# Patient Record
Sex: Female | Born: 1935 | Race: White | Hispanic: No | Smoking: Former smoker
Health system: Southern US, Community
[De-identification: ages and names within clinical notes are randomized; demographics above are authoritative.]

## PROBLEM LIST (undated history)

## (undated) DIAGNOSIS — I10 Essential (primary) hypertension: Secondary | ICD-10-CM

## (undated) DIAGNOSIS — M1990 Unspecified osteoarthritis, unspecified site: Secondary | ICD-10-CM

## (undated) DIAGNOSIS — I519 Heart disease, unspecified: Secondary | ICD-10-CM

## (undated) DIAGNOSIS — E7800 Pure hypercholesterolemia, unspecified: Secondary | ICD-10-CM

## (undated) DIAGNOSIS — K449 Diaphragmatic hernia without obstruction or gangrene: Secondary | ICD-10-CM

## (undated) DIAGNOSIS — K219 Gastro-esophageal reflux disease without esophagitis: Secondary | ICD-10-CM

## (undated) DIAGNOSIS — K589 Irritable bowel syndrome without diarrhea: Secondary | ICD-10-CM

## (undated) HISTORY — PX: CARDIAC CATHETERIZATION: SHX172

## (undated) HISTORY — PX: EYE SURGERY: SHX253

---

## 1971-02-27 HISTORY — PX: CHOLECYSTECTOMY: SHX55

## 1971-02-27 HISTORY — PX: APPENDECTOMY: SHX54

## 1984-02-27 HISTORY — PX: ABDOMINAL HYSTERECTOMY: SHX81

## 2004-02-27 HISTORY — PX: CATARACT EXTRACTION: SUR2

## 2007-02-27 HISTORY — PX: OTHER SURGICAL HISTORY: SHX169

## 2011-02-27 HISTORY — PX: ULNAR NERVE REPAIR: SHX2594

## 2013-03-07 DIAGNOSIS — R059 Cough, unspecified: Secondary | ICD-10-CM | POA: Diagnosis not present

## 2013-03-07 DIAGNOSIS — R05 Cough: Secondary | ICD-10-CM | POA: Diagnosis not present

## 2013-03-07 DIAGNOSIS — R069 Unspecified abnormalities of breathing: Secondary | ICD-10-CM | POA: Diagnosis not present

## 2013-03-07 DIAGNOSIS — R0609 Other forms of dyspnea: Secondary | ICD-10-CM | POA: Diagnosis not present

## 2013-03-07 DIAGNOSIS — I10 Essential (primary) hypertension: Secondary | ICD-10-CM | POA: Diagnosis not present

## 2013-03-07 DIAGNOSIS — R0989 Other specified symptoms and signs involving the circulatory and respiratory systems: Secondary | ICD-10-CM | POA: Diagnosis not present

## 2013-03-07 DIAGNOSIS — E876 Hypokalemia: Secondary | ICD-10-CM | POA: Diagnosis not present

## 2013-03-07 DIAGNOSIS — J189 Pneumonia, unspecified organism: Secondary | ICD-10-CM | POA: Diagnosis not present

## 2013-03-07 DIAGNOSIS — R071 Chest pain on breathing: Secondary | ICD-10-CM | POA: Diagnosis not present

## 2013-03-08 DIAGNOSIS — I2510 Atherosclerotic heart disease of native coronary artery without angina pectoris: Secondary | ICD-10-CM | POA: Diagnosis present

## 2013-03-08 DIAGNOSIS — R0609 Other forms of dyspnea: Secondary | ICD-10-CM | POA: Diagnosis not present

## 2013-03-08 DIAGNOSIS — R059 Cough, unspecified: Secondary | ICD-10-CM | POA: Diagnosis not present

## 2013-03-08 DIAGNOSIS — G4700 Insomnia, unspecified: Secondary | ICD-10-CM | POA: Diagnosis present

## 2013-03-08 DIAGNOSIS — R079 Chest pain, unspecified: Secondary | ICD-10-CM | POA: Diagnosis not present

## 2013-03-08 DIAGNOSIS — I10 Essential (primary) hypertension: Secondary | ICD-10-CM | POA: Diagnosis not present

## 2013-03-08 DIAGNOSIS — R51 Headache: Secondary | ICD-10-CM | POA: Diagnosis not present

## 2013-03-08 DIAGNOSIS — R05 Cough: Secondary | ICD-10-CM | POA: Diagnosis not present

## 2013-03-08 DIAGNOSIS — K219 Gastro-esophageal reflux disease without esophagitis: Secondary | ICD-10-CM | POA: Diagnosis not present

## 2013-03-08 DIAGNOSIS — E876 Hypokalemia: Secondary | ICD-10-CM | POA: Diagnosis not present

## 2013-03-08 DIAGNOSIS — R071 Chest pain on breathing: Secondary | ICD-10-CM | POA: Diagnosis not present

## 2013-03-08 DIAGNOSIS — I252 Old myocardial infarction: Secondary | ICD-10-CM | POA: Diagnosis not present

## 2013-03-08 DIAGNOSIS — J189 Pneumonia, unspecified organism: Secondary | ICD-10-CM | POA: Diagnosis not present

## 2013-03-16 DIAGNOSIS — M7989 Other specified soft tissue disorders: Secondary | ICD-10-CM | POA: Diagnosis not present

## 2013-03-16 DIAGNOSIS — G4700 Insomnia, unspecified: Secondary | ICD-10-CM | POA: Diagnosis not present

## 2013-03-16 DIAGNOSIS — J189 Pneumonia, unspecified organism: Secondary | ICD-10-CM | POA: Diagnosis not present

## 2013-03-16 DIAGNOSIS — M25569 Pain in unspecified knee: Secondary | ICD-10-CM | POA: Diagnosis not present

## 2013-03-17 DIAGNOSIS — R9431 Abnormal electrocardiogram [ECG] [EKG]: Secondary | ICD-10-CM | POA: Diagnosis not present

## 2013-03-17 DIAGNOSIS — M7989 Other specified soft tissue disorders: Secondary | ICD-10-CM | POA: Diagnosis not present

## 2013-03-23 DIAGNOSIS — J189 Pneumonia, unspecified organism: Secondary | ICD-10-CM | POA: Diagnosis not present

## 2013-03-24 DIAGNOSIS — M1710 Unilateral primary osteoarthritis, unspecified knee: Secondary | ICD-10-CM | POA: Diagnosis not present

## 2013-03-24 DIAGNOSIS — M79609 Pain in unspecified limb: Secondary | ICD-10-CM | POA: Diagnosis not present

## 2013-04-03 DIAGNOSIS — M7989 Other specified soft tissue disorders: Secondary | ICD-10-CM | POA: Diagnosis not present

## 2013-04-03 DIAGNOSIS — M79609 Pain in unspecified limb: Secondary | ICD-10-CM | POA: Diagnosis not present

## 2013-04-09 DIAGNOSIS — M1710 Unilateral primary osteoarthritis, unspecified knee: Secondary | ICD-10-CM | POA: Diagnosis not present

## 2013-04-09 DIAGNOSIS — M19079 Primary osteoarthritis, unspecified ankle and foot: Secondary | ICD-10-CM | POA: Diagnosis not present

## 2013-04-09 DIAGNOSIS — M7989 Other specified soft tissue disorders: Secondary | ICD-10-CM | POA: Diagnosis not present

## 2013-04-23 DIAGNOSIS — H4011X0 Primary open-angle glaucoma, stage unspecified: Secondary | ICD-10-CM | POA: Diagnosis not present

## 2013-04-23 DIAGNOSIS — H2640 Unspecified secondary cataract: Secondary | ICD-10-CM | POA: Diagnosis not present

## 2013-05-04 DIAGNOSIS — E782 Mixed hyperlipidemia: Secondary | ICD-10-CM | POA: Diagnosis not present

## 2013-05-04 DIAGNOSIS — N189 Chronic kidney disease, unspecified: Secondary | ICD-10-CM | POA: Diagnosis not present

## 2013-05-04 DIAGNOSIS — I10 Essential (primary) hypertension: Secondary | ICD-10-CM | POA: Diagnosis not present

## 2013-05-04 DIAGNOSIS — E785 Hyperlipidemia, unspecified: Secondary | ICD-10-CM | POA: Diagnosis not present

## 2013-05-04 DIAGNOSIS — I129 Hypertensive chronic kidney disease with stage 1 through stage 4 chronic kidney disease, or unspecified chronic kidney disease: Secondary | ICD-10-CM | POA: Diagnosis not present

## 2013-05-04 DIAGNOSIS — R0609 Other forms of dyspnea: Secondary | ICD-10-CM | POA: Diagnosis not present

## 2013-05-04 DIAGNOSIS — D649 Anemia, unspecified: Secondary | ICD-10-CM | POA: Diagnosis not present

## 2013-05-04 DIAGNOSIS — K219 Gastro-esophageal reflux disease without esophagitis: Secondary | ICD-10-CM | POA: Diagnosis not present

## 2013-05-04 DIAGNOSIS — I519 Heart disease, unspecified: Secondary | ICD-10-CM | POA: Diagnosis not present

## 2013-05-08 DIAGNOSIS — R0609 Other forms of dyspnea: Secondary | ICD-10-CM | POA: Diagnosis not present

## 2013-05-08 DIAGNOSIS — R0989 Other specified symptoms and signs involving the circulatory and respiratory systems: Secondary | ICD-10-CM | POA: Diagnosis not present

## 2013-05-15 DIAGNOSIS — R0609 Other forms of dyspnea: Secondary | ICD-10-CM | POA: Diagnosis not present

## 2013-05-15 DIAGNOSIS — I519 Heart disease, unspecified: Secondary | ICD-10-CM | POA: Diagnosis not present

## 2013-05-15 DIAGNOSIS — I059 Rheumatic mitral valve disease, unspecified: Secondary | ICD-10-CM | POA: Diagnosis not present

## 2013-05-15 DIAGNOSIS — R0989 Other specified symptoms and signs involving the circulatory and respiratory systems: Secondary | ICD-10-CM | POA: Diagnosis not present

## 2013-05-22 DIAGNOSIS — R0989 Other specified symptoms and signs involving the circulatory and respiratory systems: Secondary | ICD-10-CM | POA: Diagnosis not present

## 2013-05-22 DIAGNOSIS — I2510 Atherosclerotic heart disease of native coronary artery without angina pectoris: Secondary | ICD-10-CM | POA: Diagnosis not present

## 2013-05-22 DIAGNOSIS — R0609 Other forms of dyspnea: Secondary | ICD-10-CM | POA: Diagnosis not present

## 2013-05-22 DIAGNOSIS — I209 Angina pectoris, unspecified: Secondary | ICD-10-CM | POA: Diagnosis not present

## 2013-06-23 DIAGNOSIS — E785 Hyperlipidemia, unspecified: Secondary | ICD-10-CM | POA: Diagnosis not present

## 2013-06-23 DIAGNOSIS — R011 Cardiac murmur, unspecified: Secondary | ICD-10-CM | POA: Diagnosis not present

## 2013-06-23 DIAGNOSIS — I2510 Atherosclerotic heart disease of native coronary artery without angina pectoris: Secondary | ICD-10-CM | POA: Diagnosis not present

## 2013-06-23 DIAGNOSIS — R012 Other cardiac sounds: Secondary | ICD-10-CM | POA: Diagnosis not present

## 2013-07-07 DIAGNOSIS — J209 Acute bronchitis, unspecified: Secondary | ICD-10-CM | POA: Diagnosis not present

## 2013-07-23 DIAGNOSIS — R609 Edema, unspecified: Secondary | ICD-10-CM | POA: Diagnosis not present

## 2013-07-26 DIAGNOSIS — R059 Cough, unspecified: Secondary | ICD-10-CM | POA: Diagnosis not present

## 2013-07-26 DIAGNOSIS — R0602 Shortness of breath: Secondary | ICD-10-CM | POA: Diagnosis not present

## 2013-07-26 DIAGNOSIS — R05 Cough: Secondary | ICD-10-CM | POA: Diagnosis not present

## 2013-07-26 DIAGNOSIS — R0609 Other forms of dyspnea: Secondary | ICD-10-CM | POA: Diagnosis not present

## 2013-07-26 DIAGNOSIS — I10 Essential (primary) hypertension: Secondary | ICD-10-CM | POA: Diagnosis not present

## 2013-08-04 DIAGNOSIS — I2510 Atherosclerotic heart disease of native coronary artery without angina pectoris: Secondary | ICD-10-CM | POA: Diagnosis not present

## 2013-08-04 DIAGNOSIS — E785 Hyperlipidemia, unspecified: Secondary | ICD-10-CM | POA: Diagnosis not present

## 2013-08-04 DIAGNOSIS — R011 Cardiac murmur, unspecified: Secondary | ICD-10-CM | POA: Diagnosis not present

## 2013-08-06 DIAGNOSIS — H409 Unspecified glaucoma: Secondary | ICD-10-CM | POA: Diagnosis not present

## 2013-08-06 DIAGNOSIS — I10 Essential (primary) hypertension: Secondary | ICD-10-CM | POA: Diagnosis not present

## 2013-08-06 DIAGNOSIS — E785 Hyperlipidemia, unspecified: Secondary | ICD-10-CM | POA: Diagnosis not present

## 2013-08-06 DIAGNOSIS — Z1239 Encounter for other screening for malignant neoplasm of breast: Secondary | ICD-10-CM | POA: Diagnosis not present

## 2013-08-06 DIAGNOSIS — I2510 Atherosclerotic heart disease of native coronary artery without angina pectoris: Secondary | ICD-10-CM | POA: Diagnosis not present

## 2013-09-10 DIAGNOSIS — K5900 Constipation, unspecified: Secondary | ICD-10-CM | POA: Diagnosis not present

## 2013-09-10 DIAGNOSIS — K449 Diaphragmatic hernia without obstruction or gangrene: Secondary | ICD-10-CM | POA: Diagnosis not present

## 2013-09-10 DIAGNOSIS — K219 Gastro-esophageal reflux disease without esophagitis: Secondary | ICD-10-CM | POA: Diagnosis not present

## 2013-09-10 DIAGNOSIS — K2970 Gastritis, unspecified, without bleeding: Secondary | ICD-10-CM | POA: Diagnosis not present

## 2013-09-24 DIAGNOSIS — Z1231 Encounter for screening mammogram for malignant neoplasm of breast: Secondary | ICD-10-CM | POA: Diagnosis not present

## 2013-09-28 DIAGNOSIS — R21 Rash and other nonspecific skin eruption: Secondary | ICD-10-CM | POA: Diagnosis not present

## 2013-09-28 DIAGNOSIS — Z6828 Body mass index (BMI) 28.0-28.9, adult: Secondary | ICD-10-CM | POA: Diagnosis not present

## 2013-10-14 DIAGNOSIS — K449 Diaphragmatic hernia without obstruction or gangrene: Secondary | ICD-10-CM | POA: Diagnosis not present

## 2013-10-16 DIAGNOSIS — Z23 Encounter for immunization: Secondary | ICD-10-CM | POA: Diagnosis not present

## 2013-10-22 DIAGNOSIS — H2640 Unspecified secondary cataract: Secondary | ICD-10-CM | POA: Diagnosis not present

## 2013-10-22 DIAGNOSIS — H4011X0 Primary open-angle glaucoma, stage unspecified: Secondary | ICD-10-CM | POA: Diagnosis not present

## 2013-11-25 DIAGNOSIS — E785 Hyperlipidemia, unspecified: Secondary | ICD-10-CM | POA: Diagnosis not present

## 2013-11-25 DIAGNOSIS — I2510 Atherosclerotic heart disease of native coronary artery without angina pectoris: Secondary | ICD-10-CM | POA: Diagnosis not present

## 2013-11-25 DIAGNOSIS — K5900 Constipation, unspecified: Secondary | ICD-10-CM | POA: Diagnosis not present

## 2013-11-25 DIAGNOSIS — R011 Cardiac murmur, unspecified: Secondary | ICD-10-CM | POA: Diagnosis not present

## 2013-11-25 DIAGNOSIS — K219 Gastro-esophageal reflux disease without esophagitis: Secondary | ICD-10-CM | POA: Diagnosis not present

## 2013-11-25 DIAGNOSIS — R609 Edema, unspecified: Secondary | ICD-10-CM | POA: Diagnosis not present

## 2013-11-25 DIAGNOSIS — R1012 Left upper quadrant pain: Secondary | ICD-10-CM | POA: Diagnosis not present

## 2013-11-25 DIAGNOSIS — K449 Diaphragmatic hernia without obstruction or gangrene: Secondary | ICD-10-CM | POA: Diagnosis not present

## 2013-12-03 DIAGNOSIS — R079 Chest pain, unspecified: Secondary | ICD-10-CM | POA: Diagnosis not present

## 2013-12-04 DIAGNOSIS — R079 Chest pain, unspecified: Secondary | ICD-10-CM | POA: Diagnosis not present

## 2013-12-07 DIAGNOSIS — H409 Unspecified glaucoma: Secondary | ICD-10-CM | POA: Diagnosis not present

## 2013-12-07 DIAGNOSIS — E785 Hyperlipidemia, unspecified: Secondary | ICD-10-CM | POA: Diagnosis not present

## 2013-12-07 DIAGNOSIS — I2510 Atherosclerotic heart disease of native coronary artery without angina pectoris: Secondary | ICD-10-CM | POA: Diagnosis not present

## 2013-12-07 DIAGNOSIS — I10 Essential (primary) hypertension: Secondary | ICD-10-CM | POA: Diagnosis not present

## 2013-12-07 DIAGNOSIS — K219 Gastro-esophageal reflux disease without esophagitis: Secondary | ICD-10-CM | POA: Diagnosis not present

## 2013-12-07 DIAGNOSIS — Z13820 Encounter for screening for osteoporosis: Secondary | ICD-10-CM | POA: Diagnosis not present

## 2013-12-18 DIAGNOSIS — K449 Diaphragmatic hernia without obstruction or gangrene: Secondary | ICD-10-CM | POA: Diagnosis not present

## 2013-12-18 DIAGNOSIS — K219 Gastro-esophageal reflux disease without esophagitis: Secondary | ICD-10-CM | POA: Diagnosis not present

## 2014-02-18 DIAGNOSIS — S2020XA Contusion of thorax, unspecified, initial encounter: Secondary | ICD-10-CM | POA: Diagnosis not present

## 2014-03-09 DIAGNOSIS — L309 Dermatitis, unspecified: Secondary | ICD-10-CM | POA: Diagnosis not present

## 2014-03-09 DIAGNOSIS — I10 Essential (primary) hypertension: Secondary | ICD-10-CM | POA: Diagnosis not present

## 2014-03-09 DIAGNOSIS — Z13820 Encounter for screening for osteoporosis: Secondary | ICD-10-CM | POA: Diagnosis not present

## 2014-03-09 DIAGNOSIS — E785 Hyperlipidemia, unspecified: Secondary | ICD-10-CM | POA: Diagnosis not present

## 2014-03-09 DIAGNOSIS — H409 Unspecified glaucoma: Secondary | ICD-10-CM | POA: Diagnosis not present

## 2014-03-09 DIAGNOSIS — E876 Hypokalemia: Secondary | ICD-10-CM | POA: Diagnosis not present

## 2014-03-09 DIAGNOSIS — I2510 Atherosclerotic heart disease of native coronary artery without angina pectoris: Secondary | ICD-10-CM | POA: Diagnosis not present

## 2014-04-02 DIAGNOSIS — D485 Neoplasm of uncertain behavior of skin: Secondary | ICD-10-CM | POA: Diagnosis not present

## 2014-04-02 DIAGNOSIS — L820 Inflamed seborrheic keratosis: Secondary | ICD-10-CM | POA: Diagnosis not present

## 2014-04-02 DIAGNOSIS — D225 Melanocytic nevi of trunk: Secondary | ICD-10-CM | POA: Diagnosis not present

## 2014-04-02 DIAGNOSIS — L299 Pruritus, unspecified: Secondary | ICD-10-CM | POA: Diagnosis not present

## 2014-04-22 DIAGNOSIS — H524 Presbyopia: Secondary | ICD-10-CM | POA: Diagnosis not present

## 2014-04-22 DIAGNOSIS — H4011X3 Primary open-angle glaucoma, severe stage: Secondary | ICD-10-CM | POA: Diagnosis not present

## 2014-04-22 DIAGNOSIS — Z961 Presence of intraocular lens: Secondary | ICD-10-CM | POA: Diagnosis not present

## 2014-04-23 DIAGNOSIS — L988 Other specified disorders of the skin and subcutaneous tissue: Secondary | ICD-10-CM | POA: Diagnosis not present

## 2014-04-23 DIAGNOSIS — D225 Melanocytic nevi of trunk: Secondary | ICD-10-CM | POA: Diagnosis not present

## 2014-04-23 DIAGNOSIS — D485 Neoplasm of uncertain behavior of skin: Secondary | ICD-10-CM | POA: Diagnosis not present

## 2014-04-23 DIAGNOSIS — L299 Pruritus, unspecified: Secondary | ICD-10-CM | POA: Diagnosis not present

## 2014-04-27 DIAGNOSIS — I6529 Occlusion and stenosis of unspecified carotid artery: Secondary | ICD-10-CM | POA: Diagnosis not present

## 2014-05-07 DIAGNOSIS — H524 Presbyopia: Secondary | ICD-10-CM | POA: Diagnosis not present

## 2014-05-07 DIAGNOSIS — Z961 Presence of intraocular lens: Secondary | ICD-10-CM | POA: Diagnosis not present

## 2014-05-07 DIAGNOSIS — H4011X3 Primary open-angle glaucoma, severe stage: Secondary | ICD-10-CM | POA: Diagnosis not present

## 2014-06-22 DIAGNOSIS — G3184 Mild cognitive impairment, so stated: Secondary | ICD-10-CM | POA: Diagnosis not present

## 2014-06-22 DIAGNOSIS — I10 Essential (primary) hypertension: Secondary | ICD-10-CM | POA: Diagnosis not present

## 2014-06-22 DIAGNOSIS — K210 Gastro-esophageal reflux disease with esophagitis: Secondary | ICD-10-CM | POA: Diagnosis not present

## 2014-06-22 DIAGNOSIS — E785 Hyperlipidemia, unspecified: Secondary | ICD-10-CM | POA: Diagnosis not present

## 2014-06-22 DIAGNOSIS — H409 Unspecified glaucoma: Secondary | ICD-10-CM | POA: Diagnosis not present

## 2014-06-22 DIAGNOSIS — Z79899 Other long term (current) drug therapy: Secondary | ICD-10-CM | POA: Diagnosis not present

## 2014-06-22 DIAGNOSIS — E876 Hypokalemia: Secondary | ICD-10-CM | POA: Diagnosis not present

## 2014-06-22 DIAGNOSIS — L309 Dermatitis, unspecified: Secondary | ICD-10-CM | POA: Diagnosis not present

## 2014-06-22 DIAGNOSIS — I2510 Atherosclerotic heart disease of native coronary artery without angina pectoris: Secondary | ICD-10-CM | POA: Diagnosis not present

## 2014-06-22 DIAGNOSIS — G629 Polyneuropathy, unspecified: Secondary | ICD-10-CM | POA: Diagnosis not present

## 2014-07-27 DIAGNOSIS — E782 Mixed hyperlipidemia: Secondary | ICD-10-CM | POA: Diagnosis not present

## 2014-07-27 DIAGNOSIS — I25110 Atherosclerotic heart disease of native coronary artery with unstable angina pectoris: Secondary | ICD-10-CM | POA: Diagnosis not present

## 2014-07-27 DIAGNOSIS — R011 Cardiac murmur, unspecified: Secondary | ICD-10-CM | POA: Diagnosis not present

## 2014-08-03 DIAGNOSIS — H4011X3 Primary open-angle glaucoma, severe stage: Secondary | ICD-10-CM | POA: Diagnosis not present

## 2014-08-03 DIAGNOSIS — Z961 Presence of intraocular lens: Secondary | ICD-10-CM | POA: Diagnosis not present

## 2014-08-03 DIAGNOSIS — H524 Presbyopia: Secondary | ICD-10-CM | POA: Diagnosis not present

## 2014-09-01 DIAGNOSIS — M779 Enthesopathy, unspecified: Secondary | ICD-10-CM | POA: Diagnosis not present

## 2014-09-28 DIAGNOSIS — I10 Essential (primary) hypertension: Secondary | ICD-10-CM | POA: Diagnosis not present

## 2014-09-28 DIAGNOSIS — Z1239 Encounter for other screening for malignant neoplasm of breast: Secondary | ICD-10-CM | POA: Diagnosis not present

## 2014-09-28 DIAGNOSIS — N183 Chronic kidney disease, stage 3 (moderate): Secondary | ICD-10-CM | POA: Diagnosis not present

## 2014-09-28 DIAGNOSIS — Z79899 Other long term (current) drug therapy: Secondary | ICD-10-CM | POA: Diagnosis not present

## 2014-09-28 DIAGNOSIS — Z1329 Encounter for screening for other suspected endocrine disorder: Secondary | ICD-10-CM | POA: Diagnosis not present

## 2014-09-28 DIAGNOSIS — Z13820 Encounter for screening for osteoporosis: Secondary | ICD-10-CM | POA: Diagnosis not present

## 2014-09-28 DIAGNOSIS — E785 Hyperlipidemia, unspecified: Secondary | ICD-10-CM | POA: Diagnosis not present

## 2014-09-28 DIAGNOSIS — G3184 Mild cognitive impairment, so stated: Secondary | ICD-10-CM | POA: Diagnosis not present

## 2014-09-28 DIAGNOSIS — H409 Unspecified glaucoma: Secondary | ICD-10-CM | POA: Diagnosis not present

## 2014-09-28 DIAGNOSIS — G629 Polyneuropathy, unspecified: Secondary | ICD-10-CM | POA: Diagnosis not present

## 2014-09-28 DIAGNOSIS — I2510 Atherosclerotic heart disease of native coronary artery without angina pectoris: Secondary | ICD-10-CM | POA: Diagnosis not present

## 2014-10-04 DIAGNOSIS — Z780 Asymptomatic menopausal state: Secondary | ICD-10-CM | POA: Diagnosis not present

## 2014-10-04 DIAGNOSIS — Z1231 Encounter for screening mammogram for malignant neoplasm of breast: Secondary | ICD-10-CM | POA: Diagnosis not present

## 2014-10-04 DIAGNOSIS — M810 Age-related osteoporosis without current pathological fracture: Secondary | ICD-10-CM | POA: Diagnosis not present

## 2014-10-04 DIAGNOSIS — E785 Hyperlipidemia, unspecified: Secondary | ICD-10-CM | POA: Diagnosis not present

## 2014-10-04 DIAGNOSIS — M818 Other osteoporosis without current pathological fracture: Secondary | ICD-10-CM | POA: Diagnosis not present

## 2014-11-15 ENCOUNTER — Emergency Department (HOSPITAL_COMMUNITY): Payer: Medicare Other

## 2014-11-15 ENCOUNTER — Encounter (HOSPITAL_COMMUNITY): Payer: Self-pay | Admitting: Emergency Medicine

## 2014-11-15 ENCOUNTER — Inpatient Hospital Stay (HOSPITAL_COMMUNITY)
Admission: EM | Admit: 2014-11-15 | Discharge: 2014-11-30 | DRG: 336 | Disposition: A | Discharge status: Home / Self Care | Payer: Medicare Other | Attending: Internal Medicine | Admitting: Internal Medicine

## 2014-11-15 DIAGNOSIS — D649 Anemia, unspecified: Secondary | ICD-10-CM | POA: Diagnosis present

## 2014-11-15 DIAGNOSIS — Z6826 Body mass index (BMI) 26.0-26.9, adult: Secondary | ICD-10-CM

## 2014-11-15 DIAGNOSIS — Z87891 Personal history of nicotine dependence: Secondary | ICD-10-CM | POA: Diagnosis not present

## 2014-11-15 DIAGNOSIS — K589 Irritable bowel syndrome without diarrhea: Secondary | ICD-10-CM | POA: Diagnosis present

## 2014-11-15 DIAGNOSIS — Z79899 Other long term (current) drug therapy: Secondary | ICD-10-CM

## 2014-11-15 DIAGNOSIS — Z7982 Long term (current) use of aspirin: Secondary | ICD-10-CM

## 2014-11-15 DIAGNOSIS — G8929 Other chronic pain: Secondary | ICD-10-CM | POA: Diagnosis present

## 2014-11-15 DIAGNOSIS — D696 Thrombocytopenia, unspecified: Secondary | ICD-10-CM | POA: Diagnosis not present

## 2014-11-15 DIAGNOSIS — E876 Hypokalemia: Secondary | ICD-10-CM | POA: Diagnosis not present

## 2014-11-15 DIAGNOSIS — I493 Ventricular premature depolarization: Secondary | ICD-10-CM | POA: Diagnosis present

## 2014-11-15 DIAGNOSIS — R112 Nausea with vomiting, unspecified: Secondary | ICD-10-CM | POA: Diagnosis not present

## 2014-11-15 DIAGNOSIS — D72829 Elevated white blood cell count, unspecified: Secondary | ICD-10-CM | POA: Diagnosis present

## 2014-11-15 DIAGNOSIS — K5900 Constipation, unspecified: Secondary | ICD-10-CM | POA: Diagnosis not present

## 2014-11-15 DIAGNOSIS — J9811 Atelectasis: Secondary | ICD-10-CM | POA: Diagnosis not present

## 2014-11-15 DIAGNOSIS — R059 Cough, unspecified: Secondary | ICD-10-CM

## 2014-11-15 DIAGNOSIS — Z8249 Family history of ischemic heart disease and other diseases of the circulatory system: Secondary | ICD-10-CM

## 2014-11-15 DIAGNOSIS — I10 Essential (primary) hypertension: Secondary | ICD-10-CM | POA: Diagnosis not present

## 2014-11-15 DIAGNOSIS — Z90710 Acquired absence of both cervix and uterus: Secondary | ICD-10-CM

## 2014-11-15 DIAGNOSIS — K449 Diaphragmatic hernia without obstruction or gangrene: Secondary | ICD-10-CM | POA: Diagnosis present

## 2014-11-15 DIAGNOSIS — R05 Cough: Secondary | ICD-10-CM | POA: Diagnosis not present

## 2014-11-15 DIAGNOSIS — Z833 Family history of diabetes mellitus: Secondary | ICD-10-CM | POA: Diagnosis not present

## 2014-11-15 DIAGNOSIS — K56609 Unspecified intestinal obstruction, unspecified as to partial versus complete obstruction: Secondary | ICD-10-CM

## 2014-11-15 DIAGNOSIS — K219 Gastro-esophageal reflux disease without esophagitis: Secondary | ICD-10-CM | POA: Diagnosis present

## 2014-11-15 DIAGNOSIS — I959 Hypotension, unspecified: Secondary | ICD-10-CM | POA: Diagnosis not present

## 2014-11-15 DIAGNOSIS — R079 Chest pain, unspecified: Secondary | ICD-10-CM | POA: Diagnosis not present

## 2014-11-15 DIAGNOSIS — I2510 Atherosclerotic heart disease of native coronary artery without angina pectoris: Secondary | ICD-10-CM | POA: Diagnosis present

## 2014-11-15 DIAGNOSIS — R008 Other abnormalities of heart beat: Secondary | ICD-10-CM | POA: Diagnosis not present

## 2014-11-15 DIAGNOSIS — K5660 Unspecified intestinal obstruction: Secondary | ICD-10-CM | POA: Diagnosis not present

## 2014-11-15 DIAGNOSIS — R1013 Epigastric pain: Secondary | ICD-10-CM | POA: Diagnosis not present

## 2014-11-15 DIAGNOSIS — K5669 Other intestinal obstruction: Secondary | ICD-10-CM | POA: Diagnosis not present

## 2014-11-15 DIAGNOSIS — Z9049 Acquired absence of other specified parts of digestive tract: Secondary | ICD-10-CM | POA: Diagnosis not present

## 2014-11-15 DIAGNOSIS — K565 Intestinal adhesions [bands] with obstruction (postprocedural) (postinfection): Principal | ICD-10-CM | POA: Diagnosis present

## 2014-11-15 DIAGNOSIS — I252 Old myocardial infarction: Secondary | ICD-10-CM | POA: Diagnosis not present

## 2014-11-15 DIAGNOSIS — E440 Moderate protein-calorie malnutrition: Secondary | ICD-10-CM | POA: Diagnosis present

## 2014-11-15 DIAGNOSIS — M1990 Unspecified osteoarthritis, unspecified site: Secondary | ICD-10-CM | POA: Diagnosis present

## 2014-11-15 DIAGNOSIS — I498 Other specified cardiac arrhythmias: Secondary | ICD-10-CM | POA: Diagnosis not present

## 2014-11-15 DIAGNOSIS — E785 Hyperlipidemia, unspecified: Secondary | ICD-10-CM | POA: Diagnosis not present

## 2014-11-15 DIAGNOSIS — I499 Cardiac arrhythmia, unspecified: Secondary | ICD-10-CM

## 2014-11-15 HISTORY — DX: Unspecified osteoarthritis, unspecified site: M19.90

## 2014-11-15 HISTORY — DX: Diaphragmatic hernia without obstruction or gangrene: K44.9

## 2014-11-15 HISTORY — DX: Irritable bowel syndrome, unspecified: K58.9

## 2014-11-15 HISTORY — DX: Essential (primary) hypertension: I10

## 2014-11-15 HISTORY — DX: Gastro-esophageal reflux disease without esophagitis: K21.9

## 2014-11-15 HISTORY — DX: Pure hypercholesterolemia, unspecified: E78.00

## 2014-11-15 HISTORY — DX: Heart disease, unspecified: I51.9

## 2014-11-15 LAB — I-STAT CG4 LACTIC ACID, ED: Lactic Acid, Venous: 1.4 mmol/L (ref 0.5–2.0)

## 2014-11-15 LAB — CBC
HEMATOCRIT: 42.5 % (ref 36.0–46.0)
Hemoglobin: 14.3 g/dL (ref 12.0–15.0)
MCH: 31.6 pg (ref 26.0–34.0)
MCHC: 33.6 g/dL (ref 30.0–36.0)
MCV: 94 fL (ref 78.0–100.0)
Platelets: 126 10*3/uL — ABNORMAL LOW (ref 150–400)
RBC: 4.52 MIL/uL (ref 3.87–5.11)
RDW: 15.9 % — AB (ref 11.5–15.5)
WBC: 11.9 10*3/uL — AB (ref 4.0–10.5)

## 2014-11-15 LAB — BASIC METABOLIC PANEL
Anion gap: 11 (ref 5–15)
BUN: 19 mg/dL (ref 6–20)
CHLORIDE: 99 mmol/L — AB (ref 101–111)
CO2: 28 mmol/L (ref 22–32)
CREATININE: 0.96 mg/dL (ref 0.44–1.00)
Calcium: 10 mg/dL (ref 8.9–10.3)
GFR calc Af Amer: >60 mL/min (ref >60)
GFR calc non Af Amer: 54 mL/min — ABNORMAL LOW (ref >60)
GLUCOSE: 159 mg/dL — AB (ref 65–99)
POTASSIUM: 3.6 mmol/L (ref 3.5–5.1)
Sodium: 138 mmol/L (ref 135–145)

## 2014-11-15 LAB — HEPATIC FUNCTION PANEL
ALT: 17 U/L (ref 14–54)
AST: 31 U/L (ref 15–41)
Albumin: 4.8 g/dL (ref 3.5–5.0)
Alkaline Phosphatase: 68 U/L (ref 38–126)
Bilirubin, Direct: 0.1 mg/dL (ref 0.1–0.5)
Indirect Bilirubin: 0.5 mg/dL (ref 0.3–0.9)
Total Bilirubin: 0.6 mg/dL (ref 0.3–1.2)
Total Protein: 7.8 g/dL (ref 6.5–8.1)

## 2014-11-15 LAB — I-STAT TROPONIN, ED: Troponin i, poc: 0.01 ng/mL (ref 0.00–0.08)

## 2014-11-15 LAB — LIPASE, BLOOD: Lipase: 27 U/L (ref 22–51)

## 2014-11-15 MED ORDER — LIDOCAINE HCL 2 % EX GEL
CUTANEOUS | Status: AC
  Filled 2014-11-15: qty 10

## 2014-11-15 MED ORDER — TIMOLOL MALEATE 0.5 % OP SOLN
1.0000 [drp] | Freq: Two times a day (BID) | OPHTHALMIC | Status: DC
  Administered 2014-11-15 – 2014-11-30 (×30): 1 [drp] via OPHTHALMIC
  Filled 2014-11-15: qty 5

## 2014-11-15 MED ORDER — CARVEDILOL 3.125 MG PO TABS
3.1250 mg | ORAL_TABLET | Freq: Two times a day (BID) | ORAL | Status: DC
  Filled 2014-11-15 (×3): qty 1

## 2014-11-15 MED ORDER — SODIUM CHLORIDE 0.9 % IV BOLUS (SEPSIS)
1000.0000 mL | Freq: Once | INTRAVENOUS | Status: AC
  Administered 2014-11-15: 1000 mL via INTRAVENOUS

## 2014-11-15 MED ORDER — CHLORHEXIDINE GLUCONATE 0.12 % MT SOLN
15.0000 mL | Freq: Two times a day (BID) | OROMUCOSAL | Status: DC
  Administered 2014-11-15 – 2014-11-30 (×27): 15 mL via OROMUCOSAL
  Filled 2014-11-15 (×31): qty 15

## 2014-11-15 MED ORDER — ASPIRIN 81 MG PO CHEW: 81.0000 mg | CHEWABLE_TABLET | Freq: Every day | ORAL | Status: DC

## 2014-11-15 MED ORDER — METOCLOPRAMIDE HCL 5 MG/ML IJ SOLN
10.0000 mg | Freq: Once | INTRAMUSCULAR | Status: AC
  Administered 2014-11-15: 10 mg via INTRAVENOUS
  Filled 2014-11-15: qty 2

## 2014-11-15 MED ORDER — LISINOPRIL 5 MG PO TABS
5.0000 mg | ORAL_TABLET | Freq: Every day | ORAL | Status: DC
  Filled 2014-11-15: qty 1

## 2014-11-15 MED ORDER — MORPHINE SULFATE (PF) 2 MG/ML IV SOLN
2.0000 mg | INTRAVENOUS | Status: DC | PRN
  Administered 2014-11-16 (×2): 2 mg via INTRAVENOUS
  Filled 2014-11-15 (×2): qty 1

## 2014-11-15 MED ORDER — CETYLPYRIDINIUM CHLORIDE 0.05 % MT LIQD
7.0000 mL | Freq: Two times a day (BID) | OROMUCOSAL | Status: DC
  Administered 2014-11-16 – 2014-11-29 (×20): 7 mL via OROMUCOSAL

## 2014-11-15 MED ORDER — LUBIPROSTONE 24 MCG PO CAPS
24.0000 ug | ORAL_CAPSULE | Freq: Two times a day (BID) | ORAL | Status: DC
  Administered 2014-11-19 – 2014-11-30 (×13): 24 ug via ORAL
  Filled 2014-11-15 (×31): qty 1

## 2014-11-15 MED ORDER — IOHEXOL 300 MG/ML  SOLN
100.0000 mL | Freq: Once | INTRAMUSCULAR | Status: AC | PRN
  Administered 2014-11-15: 100 mL via INTRAVENOUS

## 2014-11-15 MED ORDER — GABAPENTIN 300 MG PO CAPS
300.0000 mg | ORAL_CAPSULE | Freq: Two times a day (BID) | ORAL | Status: DC
  Administered 2014-11-18 – 2014-11-30 (×14): 300 mg via ORAL
  Filled 2014-11-15 (×31): qty 1

## 2014-11-15 MED ORDER — LATANOPROST 0.005 % OP SOLN
1.0000 [drp] | Freq: Every day | OPHTHALMIC | Status: DC
  Administered 2014-11-15 – 2014-11-29 (×15): 1 [drp] via OPHTHALMIC
  Filled 2014-11-15: qty 2.5

## 2014-11-15 MED ORDER — SODIUM CHLORIDE 0.9 % IV SOLN
INTRAVENOUS | Status: DC
  Administered 2014-11-15 – 2014-11-16 (×3): via INTRAVENOUS

## 2014-11-15 MED ORDER — ONDANSETRON HCL 4 MG/2ML IJ SOLN
4.0000 mg | Freq: Once | INTRAMUSCULAR | Status: AC
  Administered 2014-11-15: 4 mg via INTRAVENOUS
  Filled 2014-11-15: qty 2

## 2014-11-15 MED ORDER — HYDROXYZINE HCL 25 MG PO TABS: 25.0000 mg | ORAL_TABLET | Freq: Two times a day (BID) | ORAL | Status: DC | PRN

## 2014-11-15 MED ORDER — BRIMONIDINE TARTRATE 0.2 % OP SOLN
1.0000 [drp] | Freq: Two times a day (BID) | OPHTHALMIC | Status: DC
  Administered 2014-11-15 – 2014-11-30 (×30): 1 [drp] via OPHTHALMIC
  Filled 2014-11-15: qty 5

## 2014-11-15 MED ORDER — LISINOPRIL 5 MG PO TABS
5.0000 mg | ORAL_TABLET | Freq: Every day | ORAL | Status: DC
  Filled 2014-11-15 (×2): qty 1

## 2014-11-15 MED ORDER — SIMVASTATIN 40 MG PO TABS
40.0000 mg | ORAL_TABLET | Freq: Every evening | ORAL | Status: DC
  Filled 2014-11-15: qty 1

## 2014-11-15 MED ORDER — TRAZODONE HCL 50 MG PO TABS
50.0000 mg | ORAL_TABLET | Freq: Every day | ORAL | Status: DC
  Administered 2014-11-18 – 2014-11-29 (×8): 50 mg via ORAL
  Filled 2014-11-15 (×16): qty 1

## 2014-11-15 MED ORDER — BRIMONIDINE TARTRATE-TIMOLOL 0.2-0.5 % OP SOLN: 1.0000 [drp] | Freq: Two times a day (BID) | OPHTHALMIC | Status: DC

## 2014-11-15 MED ORDER — PANTOPRAZOLE SODIUM 40 MG PO TBEC
80.0000 mg | DELAYED_RELEASE_TABLET | Freq: Every day | ORAL | Status: DC
  Administered 2014-11-15: 80 mg via ORAL
  Filled 2014-11-15: qty 2

## 2014-11-15 MED ORDER — ONDANSETRON HCL 4 MG/2ML IJ SOLN
4.0000 mg | Freq: Once | INTRAMUSCULAR | Status: AC
Start: 2014-11-15 — End: 2014-11-15
  Administered 2014-11-15: 4 mg via INTRAVENOUS
  Filled 2014-11-15: qty 2

## 2014-11-15 NOTE — ED Provider Notes (Signed)
CSN: [id number]     Arrival date & time 11/15/14  1406 History   First MD Initiated Contact with Patient 11/15/14 1545     Chief Complaint  Patient presents with  . Emesis  . Abdominal Pain     (Consider location/radiation/quality/duration/timing/severity/associated sxs/prior Treatment) HPI Comments: 81 year old female with history of hypertension, hyperlipidemia, CAD, IBS, GERD, hiatal hernia who presents with epigastric pain and vomiting. The patient has a long history of gastrointestinal problems including episodes of epigastric pain associated with vomiting. She has had this for many years and has been evaluated by a gastroenterologist in the past. They have diagnosed her with a hiatal hernia but have not wanted to perform surgery due to risk. Last night at midnight, the patient woke up vomiting and has had multiple episodes of vomiting and constant, nonradiating, moderate in intensity epigastric pain since that time. This feels exactly like previous episodes that she has had which her gastroenterologist has suspected are related to GERD and hiatal hernia. She also notes severe constipation over the last week or 2 and it has been many days since her last bowel movement. She denies any blood in her stool. No fevers, urinary symptoms, chest pain, shortness of breath, or diaphoresis. Nothing makes her symptoms better or worse.  Patient is a 81 y.o. female presenting with vomiting and abdominal pain. The history is provided by the patient.  Emesis Associated symptoms: abdominal pain   Abdominal Pain Associated symptoms: vomiting     Past Medical History  Diagnosis Date  . Hiatal hernia   . Hypertension   . Heart disease   . Hypercholesterolemia   . Arthritis   . IBS (irritable bowel syndrome)   . GERD (gastroesophageal reflux disease)    Past Surgical History  Procedure Laterality Date  . Appendectomy    . Cholecystectomy    . Abdominal hysterectomy    . Cardiac catheterization     . Cataract extraction    . Eye surgery     History reviewed. No pertinent family history. Social History  Substance Use Topics  . Smoking status: Former Research scientist (life sciences)  . Smokeless tobacco: None  . Alcohol Use: Yes     Comment: occasional   OB History    No data available     Review of Systems  Gastrointestinal: Positive for vomiting and abdominal pain.  All other systems reviewed and are negative.  10 Systems reviewed and are negative for acute change except as noted in the HPI.    Allergies  Review of patient's allergies indicates no known allergies.  Home Medications   Prior to Admission medications   Medication Sig Start Date End Date Taking? Authorizing [place]  AMITIZA 24 MCG capsule Take 1 capsule by mouth 2 (two) times daily. 09/07/14  Yes Historical Tevon Berhane, MD  aspirin 81 MG chewable tablet Chew 81 mg by mouth daily.   Yes Historical Siddharth Babington, MD  Biotin 1000 MCG tablet Take 1,000 mcg by mouth daily.   Yes Historical Diora Bellizzi, MD  bumetanide (BUMEX) 1 MG tablet Take 1 tablet by mouth daily. 08/31/14  Yes Historical Aviyana Sonntag, MD  carvedilol (COREG) 3.125 MG tablet Take 3.125 mg by mouth 2 (two) times daily. 08/31/14  Yes Historical Keylin Ferryman, MD  COMBIGAN 0.2-0.5 % ophthalmic solution Place 1 drop into both eyes 2 (two) times daily. 10/15/14  Yes Historical Racquel Arkin, MD  gabapentin (NEURONTIN) 100 MG capsule Take 300 mg by mouth 2 (two) times daily.   Yes Historical Tanay Misuraca, MD  hydrOXYzine (ATARAX/VISTARIL) 25  MG tablet Take 25 mg by mouth 2 (two) times daily as needed for itching.   Yes Historical Jaleigha Deane, MD  lisinopril (PRINIVIL,ZESTRIL) 5 MG tablet Take 5 mg by mouth daily. 08/31/14  Yes Historical Vearl Aitken, MD  nitroGLYCERIN (NITROSTAT) 0.4 MG SL tablet Place 0.4 mg under the tongue every 5 (five) minutes as needed for chest pain.   Yes Historical Tashawna Thom, MD  omeprazole (PRILOSEC) 40 MG capsule Take 40 mg by mouth 2 (two) times daily. 10/15/14  Yes Historical Eulises Kijowski, MD   OVER THE COUNTER MEDICATION "Detoxin with chlorella."   Yes Historical Adria Costley, MD  OVER THE COUNTER MEDICATION "Serovital."   Yes Historical Jannelle Notaro, MD  potassium chloride 20 MEQ/15ML (10%) SOLN Take 45 mLs by mouth daily. 10/15/14  Yes Historical Francesco Provencal, MD  simvastatin (ZOCOR) 40 MG tablet Take 40 mg by mouth every evening. 09/27/14  Yes Historical Rabecca Birge, MD  TRAVATAN Z 0.004 % SOLN ophthalmic solution Place 1 drop into both eyes at bedtime. 10/15/14  Yes Historical Admire Bunnell, MD  traZODone (DESYREL) 50 MG tablet Take 60 mg by mouth daily. 10/15/14  Yes Historical Loukas Antonson, MD  traZODone (DESYREL) 50 MG tablet Take 50 mg by mouth at bedtime.   Yes Historical Davidjames Blansett, MD   BP 146/62 mmHg  Pulse 46  Temp(Src) 98.5 F (36.9 C) (Oral)  Resp 20  SpO2 97% Physical Exam  Constitutional: She is oriented to person, place, and time. She appears well-developed and well-nourished.  Uncomfortable but NAD  HENT:  Head: Normocephalic and atraumatic.  Moist mucous membranes  Eyes: Conjunctivae are normal. Pupils are equal, round, and reactive to light.  Neck: Neck supple.  Cardiovascular: Normal rate and normal heart sounds.   No murmur heard. Regularly irregular rhythm  Pulmonary/Chest: Effort normal and breath sounds normal. No respiratory distress.  Abdominal: Soft. Bowel sounds are normal. She exhibits no distension.  midepigastric tenderness w/ no rebound or guarding  Musculoskeletal: She exhibits no edema.  Neurological: She is alert and oriented to person, place, and time.  Fluent speech  Skin: Skin is warm and dry.  Psychiatric: She has a normal mood and affect. Judgment normal.  Nursing note and vitals reviewed.   ED Course  Procedures (including critical care time) Labs Review Labs Reviewed  BASIC METABOLIC PANEL - Abnormal; Notable for the following:    Chloride 99 (*)    Glucose, Bld 159 (*)    GFR calc non Af Amer 54 (*)    All other components within normal limits   CBC - Abnormal; Notable for the following:    WBC 11.9 (*)    RDW 15.9 (*)    Platelets 126 (*)    All other components within normal limits  LIPASE, BLOOD  HEPATIC FUNCTION PANEL  I-STAT TROPOININ, ED  I-STAT CG4 LACTIC ACID, ED    Imaging Review Dg Chest 2 View  11/15/2014   CLINICAL DATA:  Mid chest pain, weakness, epigastric pain and emesis today.  EXAM: CHEST  2 VIEW  COMPARISON:  None.  FINDINGS: Lungs are hypoinflated without consolidation or effusion. Cardiomediastinal silhouette is within normal. There is calcified plaque over the thoracic aorta. There are mild degenerate changes of the spine.  IMPRESSION: Hypoinflation without acute cardiopulmonary disease.   Electronically Signed   By: Marin Olp M.D.   On: 11/15/2014 15:54   Dg Abd 1 View  11/15/2014   CLINICAL DATA:  Vomiting, nausea, epigastric abdominal pain and severe constipation. Reported history of hiatal hernia, appendectomy, hysterectomy and cholecystectomy.  EXAM: ABDOMEN - 1 VIEW  COMPARISON:  None.  FINDINGS: There are prominently dilated small bowel loops throughout the abdomen measuring up to 5.5 cm name in diameter. There is an absence of colonic gas. No evidence of pneumatosis or pneumoperitoneum. Surgical clips are noted in the right upper quadrant. Curvilinear left upper quadrant calcifications are likely atherosclerotic splenic artery calcifications. Ovoid rim calcified structure overlying the right gluteal soft tissues, likely a gluteal granuloma. Visualized lung bases are clear. Moderate degenerative changes are seen in the visualized thoracolumbar spine.  IMPRESSION: Prominently dilated small bowel loops throughout the abdomen. Paucity of colonic gas. Findings are most suggestive of a distal small bowel obstruction. Consider further evaluation with CT of the abdomen and pelvis with oral and intravenous contrast.  These results were called by telephone at the time of interpretation on 11/15/2014 at 4:47 pm to Dr.  Theotis Burrow , who verbally acknowledged these results.   Electronically Signed   By: Ilona Sorrel M.D.   On: 11/15/2014 16:47   Ct Abdomen Pelvis W Contrast  11/15/2014   CLINICAL DATA:  Nausea and vomiting with last bowel movement 11/12/2014. Previous cholecystectomy and appendectomy as well as hysterectomy.  EXAM: CT ABDOMEN AND PELVIS WITH CONTRAST  TECHNIQUE: Multidetector CT imaging of the abdomen and pelvis was performed using the standard protocol following bolus administration of intravenous contrast.  CONTRAST:  147mL OMNIPAQUE IOHEXOL 300 MG/ML  SOLN  COMPARISON:  None.  FINDINGS: Lung bases are within normal. Minimal calcification of the mitral valve annulus. Moderate size hiatal hernia is present.  Abdominal images demonstrate surgical absence of the gallbladder. There is a subcentimeter hypodensity over the left lobe of the liver too small to characterize but likely a cyst. The spleen, pancreas and adrenal glands are normal.  Kidneys are normal in size without hydronephrosis or nephrolithiasis. There is a subcentimeter hypodensity over the lower pole and upper pole cortex of the right kidney likely cysts but too small to characterize.  There are multiple dilated fluid in air-filled small bowel loops measuring up to 4.7 cm in diameter compatible with a small bowel obstruction. Site of obstruction is due to two areas of small bowel stricturing over the mid abdomen just anterior to the aortic bifurcation likely due to adhesions. There is obstruction of small bowel proximal to the more proximal stricturing as well as dilatation of a short segment of small bowel between the 2 areas of stricturing likely a closed-loop component. No evidence of pneumatosis or free peritoneal air. No evidence of free fluid. Small bowel distal to these areas of stricturing is decompressed. Colon is decompressed.  Pelvic images demonstrate the bladder and rectum to be within normal. Surgical absence of the uterus. Tiny  amount of free fluid in the pelvis.  There mild degenerate changes of the spine and hips.  IMPRESSION: Mid to distal small bowel obstruction likely due to adhesions over the mid abdomen just anterior to the aortic bifurcation. There is dilatation of the small bowel proximal to the more proximal area of stricturing. There is a second area of small bowel stricturing with a dilated loop caught between these 2 areas of stricturing likely a closed loop component. No pneumatosis or free air.  Subcentimeter liver hypodensity too small to characterize but likely a cyst. Couple subcentimeter right renal cortical hypodensities too small to characterize but likely cysts.  Postsurgical changes as described.  Moderate size hiatal hernia.  These results were called by telephone at the time of interpretation on 11/15/2014  at 7:05 pm to Dr. Theotis Burrow , who verbally acknowledged these results.   Electronically Signed   By: Marin Olp M.D.   On: 11/15/2014 19:05   I have personally reviewed and evaluated these  lab results as part of my medical decision-making.   EKG Interpretation   Date/Time:  Monday November 15 2014 15:04:32 EDT Ventricular Rate:  114 PR Interval:  150 QRS Duration: 73 QT Interval:  365 QTC Calculation: 503 R Axis:   75 Text Interpretation:  Sinus tachycardia Ventricular bigeminy Probable left  atrial enlargement Low voltage, precordial leads Repol abnrm suggests  ischemia, diffuse leads Baseline wander in lead(s) II III aVF V3 V4 no  previous for comparison  Confirmed by LITTLE MD, RACHEL ([id number]) on  11/15/2014 3:08:27 PM     Medications  lidocaine (XYLOCAINE) 2 % jelly (  Not Given 11/15/14 2045)  0.9 %  sodium chloride infusion ( Intravenous New Bag/Given 11/15/14 2120)  morphine 2 MG/ML injection 2-4 mg (not administered)  carvedilol (COREG) tablet 3.125 mg (3.125 mg Oral Not Given 11/15/14 2229)  lubiprostone (AMITIZA) capsule 24 mcg (not administered)  gabapentin (NEURONTIN)  capsule 300 mg (300 mg Oral Not Given 11/15/14 2229)  hydrOXYzine (ATARAX/VISTARIL) tablet 25 mg (not administered)  latanoprost (XALATAN) 0.005 % ophthalmic solution 1 drop (1 drop Both Eyes Given 11/15/14 2256)  traZODone (DESYREL) tablet 50 mg (50 mg Oral Not Given 11/15/14 2230)  brimonidine (ALPHAGAN) 0.2 % ophthalmic solution 1 drop (1 drop Both Eyes Given 11/15/14 2256)    And  timolol (TIMOPTIC) 0.5 % ophthalmic solution 1 drop (1 drop Both Eyes Given 11/15/14 2256)  lisinopril (PRINIVIL,ZESTRIL) tablet 5 mg (5 mg Oral Not Given 11/15/14 2229)  chlorhexidine (PERIDEX) 0.12 % solution 15 mL (15 mLs Mouth Rinse Given 11/15/14 2315)  antiseptic oral rinse (CPC / CETYLPYRIDINIUM CHLORIDE 0.05%) solution 7 mL (not administered)  ondansetron (ZOFRAN) injection 4 mg (4 mg Intravenous Given 11/15/14 1615)  sodium chloride 0.9 % bolus 1,000 mL (0 mLs Intravenous Stopped 11/15/14 1823)  ondansetron (ZOFRAN) injection 4 mg (4 mg Intravenous Given 11/15/14 1743)  iohexol (OMNIPAQUE) 300 MG/ML solution 100 mL (100 mLs Intravenous Contrast Given 11/15/14 1824)  metoCLOPramide (REGLAN) injection 10 mg (10 mg Intravenous Given 11/15/14 1943)    MDM   Final diagnoses:  Small bowel obstruction   81 year old female with history including GERD, hiatal hernia, IBS, and previous episodes of vomiting and epigastric pain who presents with vomiting and epigastric pain that began at midnight and has been constant since. At presentation, the patient was awake, alert, uncomfortable but in no acute distress. Vital signs notable for mild hypertension and heart rate 51. EKG showed ventricular bigeminy with nonspecific abnormalities with no previous available for comparison. Patient denying any chest pain, shortness of breath, diaphoresis. Midepigastric tenderness present without any lower abdominal tenderness on exam. Obtained above lab work as well as a KUB. Chest x-ray obtained from triage and given patient's history; chest  x-ray unremarkable. Gave the patient Zofran.  Labwork showed mild hyperglycemia but was otherwise unremarkable. KUB showed dilated small bowel loops with possibly of colonic gas suggestive of SBO. Obtained a CT of the abdomen and pelvis which showed SBO with closed loop caught between 2 areas of stricture without associated pneumatosis or free air. Consulted general surgery, Dr. Hassell Done, for assistance. Per his recommendation, I have ordered an NG tube for stomach decompression. Lactate normal which is reassuring against bowel ischemia. Patient admitted to general medicine for further care.  Sharlett Iles, MD 11/15/14 [phone]

## 2014-11-15 NOTE — H&P (Signed)
[facility] History and Physical  Ether Goebel GGY:[id number] DOB: 1932/07/14 DOA: 11/15/2014  Referring physician: EDP PCP: No primary care provider on file.   Chief Complaint: Emesis, abd pain   HPI: Leslie Frey is a 81 y.o. female with h/o HTN, HLD, hiatal hernia.  Patient presents to ED with epigastric pain and vomiting.  Vomiting has been non-stop since last evening.  Has been having trouble with constipation over last week or 2 and has been many days since last BM.  Pain is moderate in intensity, located in epigastric area, non-radiating.  Review of Systems: Systems reviewed.  As above, otherwise negative  Past Medical History  Diagnosis Date  . Hiatal hernia   . Hypertension   . Heart disease   . Hypercholesterolemia   . Arthritis   . IBS (irritable bowel syndrome)   . GERD (gastroesophageal reflux disease)    Past Surgical History  Procedure Laterality Date  . Appendectomy    . Cholecystectomy    . Abdominal hysterectomy    . Cardiac catheterization    . Cataract extraction    . Eye surgery     Social History:  reports that she has quit smoking. She does not have any smokeless tobacco history on file. She reports that she drinks alcohol. She reports that she does not use illicit drugs.  No Known Allergies  History reviewed. No pertinent family history.   Prior to Admission medications   Medication Sig Start Date End Date Taking? Authorizing Provider  AMITIZA 24 MCG capsule Take 1 capsule by mouth 2 (two) times daily. 09/07/14  Yes Historical Provider, MD  aspirin 81 MG chewable tablet Chew 81 mg by mouth daily.   Yes Historical Provider, MD  Biotin 1000 MCG tablet Take 1,000 mcg by mouth daily.   Yes Historical Provider, MD  bumetanide (BUMEX) 1 MG tablet Take 1 tablet by mouth daily. 08/31/14  Yes Historical Provider, MD  carvedilol (COREG) 3.125 MG tablet Take 3.125 mg by mouth 2 (two) times daily. 08/31/14  Yes Historical Provider, MD  COMBIGAN 0.2-0.5 %  ophthalmic solution Place 1 drop into both eyes 2 (two) times daily. 10/15/14  Yes Historical Provider, MD  gabapentin (NEURONTIN) 100 MG capsule Take 300 mg by mouth 2 (two) times daily.   Yes Historical Provider, MD  hydrOXYzine (ATARAX/VISTARIL) 25 MG tablet Take 25 mg by mouth 2 (two) times daily as needed for itching.   Yes Historical Provider, MD  lisinopril (PRINIVIL,ZESTRIL) 5 MG tablet Take 5 mg by mouth daily. 08/31/14  Yes Historical Provider, MD  nitroGLYCERIN (NITROSTAT) 0.4 MG SL tablet Place 0.4 mg under the tongue every 5 (five) minutes as needed for chest pain.   Yes Historical Provider, MD  omeprazole (PRILOSEC) 40 MG capsule Take 40 mg by mouth 2 (two) times daily. 10/15/14  Yes Historical Provider, MD  OVER THE COUNTER MEDICATION "Detoxin with chlorella."   Yes Historical Provider, MD  OVER THE COUNTER MEDICATION "Serovital."   Yes Historical Provider, MD  potassium chloride 20 MEQ/15ML (10%) SOLN Take 45 mLs by mouth daily. 10/15/14  Yes Historical Provider, MD  simvastatin (ZOCOR) 40 MG tablet Take 40 mg by mouth every evening. 09/27/14  Yes Historical Provider, MD  TRAVATAN Z 0.004 % SOLN ophthalmic solution Place 1 drop into both eyes at bedtime. 10/15/14  Yes Historical Provider, MD  traZODone (DESYREL) 50 MG tablet Take 60 mg by mouth daily. 10/15/14  Yes Historical Provider, MD  traZODone (DESYREL) 50 MG tablet Take 50 mg  by mouth at bedtime.   Yes Historical Provider, MD   Physical Exam: Filed Vitals:   11/15/14 1735  BP: 149/79  Pulse: 87  Temp: 97.6 F (36.4 C)  Resp: 22    BP 149/79 mmHg  Pulse 87  Temp(Src) 97.6 F (36.4 C) (Oral)  Resp 22  SpO2 100%  General Appearance:    Alert, oriented, no distress, appears stated age  Head:    Normocephalic, atraumatic  Eyes:    PERRL, EOMI, sclera non-icteric        Nose:   Nares without drainage or epistaxis. Mucosa, turbinates normal  Throat:   Moist mucous membranes. Oropharynx without erythema or exudate.  Neck:    Supple. No carotid bruits.  No thyromegaly.  No lymphadenopathy.   Back:     No CVA tenderness, no spinal tenderness  Lungs:     Clear to auscultation bilaterally, without wheezes, rhonchi or rales  Chest wall:    No tenderness to palpitation  Heart:    Regular rate and rhythm without murmurs, gallops, rubs  Abdomen:     Soft, non-tender, nondistended, normal bowel sounds, no organomegaly  Genitalia:    deferred  Rectal:    deferred  Extremities:   No clubbing, cyanosis or edema.  Pulses:   2+ and symmetric all extremities  Skin:   Skin color, texture, turgor normal, no rashes or lesions  Lymph nodes:   Cervical, supraclavicular, and axillary nodes normal  Neurologic:   CNII-XII intact. Normal strength, sensation and reflexes      throughout    Labs on Admission:  Basic Metabolic Panel:  Recent Labs Lab 11/15/14 1504  NA 138  K 3.6  CL 99*  CO2 28  GLUCOSE 159*  BUN 19  CREATININE 0.96  CALCIUM 10.0   Liver Function Tests:  Recent Labs Lab 11/15/14 1504  AST 31  ALT 17  ALKPHOS 68  BILITOT 0.6  PROT 7.8  ALBUMIN 4.8    Recent Labs Lab 11/15/14 1504  LIPASE 27   No results for input(s): AMMONIA in the last 168 hours. CBC:  Recent Labs Lab 11/15/14 1504  WBC 11.9*  HGB 14.3  HCT 42.5  MCV 94.0  PLT 126*   Cardiac Enzymes: No results for input(s): CKTOTAL, CKMB, CKMBINDEX, TROPONINI in the last 168 hours.  BNP (last 3 results) No results for input(s): PROBNP in the last 8760 hours. CBG: No results for input(s): GLUCAP in the last 168 hours.  Radiological Exams on Admission: Dg Chest 2 View  11/15/2014   CLINICAL DATA:  Mid chest pain, weakness, epigastric pain and emesis today.  EXAM: CHEST  2 VIEW  COMPARISON:  None.  FINDINGS: Lungs are hypoinflated without consolidation or effusion. Cardiomediastinal silhouette is within normal. There is calcified plaque over the thoracic aorta. There are mild degenerate changes of the spine.  IMPRESSION:  Hypoinflation without acute cardiopulmonary disease.   Electronically Signed   By: Marin Olp M.D.   On: 11/15/2014 15:54   Dg Abd 1 View  11/15/2014   CLINICAL DATA:  Vomiting, nausea, epigastric abdominal pain and severe constipation. Reported history of hiatal hernia, appendectomy, hysterectomy and cholecystectomy.  EXAM: ABDOMEN - 1 VIEW  COMPARISON:  None.  FINDINGS: There are prominently dilated small bowel loops throughout the abdomen measuring up to 5.5 cm name in diameter. There is an absence of colonic gas. No evidence of pneumatosis or pneumoperitoneum. Surgical clips are noted in the right upper quadrant. Curvilinear left upper quadrant calcifications  are likely atherosclerotic splenic artery calcifications. Ovoid rim calcified structure overlying the right gluteal soft tissues, likely a gluteal granuloma. Visualized lung bases are clear. Moderate degenerative changes are seen in the visualized thoracolumbar spine.  IMPRESSION: Prominently dilated small bowel loops throughout the abdomen. Paucity of colonic gas. Findings are most suggestive of a distal small bowel obstruction. Consider further evaluation with CT of the abdomen and pelvis with oral and intravenous contrast.  These results were called by telephone at the time of interpretation on 11/15/2014 at 4:47 pm to Dr. Theotis Burrow , who verbally acknowledged these results.   Electronically Signed   By: Ilona Sorrel M.D.   On: 11/15/2014 16:47   Ct Abdomen Pelvis W Contrast  11/15/2014   CLINICAL DATA:  Nausea and vomiting with last bowel movement 11/12/2014. Previous cholecystectomy and appendectomy as well as hysterectomy.  EXAM: CT ABDOMEN AND PELVIS WITH CONTRAST  TECHNIQUE: Multidetector CT imaging of the abdomen and pelvis was performed using the standard protocol following bolus administration of intravenous contrast.  CONTRAST:  133mL OMNIPAQUE IOHEXOL 300 MG/ML  SOLN  COMPARISON:  None.  FINDINGS: Lung bases are within normal.  Minimal calcification of the mitral valve annulus. Moderate size hiatal hernia is present.  Abdominal images demonstrate surgical absence of the gallbladder. There is a subcentimeter hypodensity over the left lobe of the liver too small to characterize but likely a cyst. The spleen, pancreas and adrenal glands are normal.  Kidneys are normal in size without hydronephrosis or nephrolithiasis. There is a subcentimeter hypodensity over the lower pole and upper pole cortex of the right kidney likely cysts but too small to characterize.  There are multiple dilated fluid in air-filled small bowel loops measuring up to 4.7 cm in diameter compatible with a small bowel obstruction. Site of obstruction is due to two areas of small bowel stricturing over the mid abdomen just anterior to the aortic bifurcation likely due to adhesions. There is obstruction of small bowel proximal to the more proximal stricturing as well as dilatation of a short segment of small bowel between the 2 areas of stricturing likely a closed-loop component. No evidence of pneumatosis or free peritoneal air. No evidence of free fluid. Small bowel distal to these areas of stricturing is decompressed. Colon is decompressed.  Pelvic images demonstrate the bladder and rectum to be within normal. Surgical absence of the uterus. Tiny amount of free fluid in the pelvis.  There mild degenerate changes of the spine and hips.  IMPRESSION: Mid to distal small bowel obstruction likely due to adhesions over the mid abdomen just anterior to the aortic bifurcation. There is dilatation of the small bowel proximal to the more proximal area of stricturing. There is a second area of small bowel stricturing with a dilated loop caught between these 2 areas of stricturing likely a closed loop component. No pneumatosis or free air.  Subcentimeter liver hypodensity too small to characterize but likely a cyst. Couple subcentimeter right renal cortical hypodensities too small to  characterize but likely cysts.  Postsurgical changes as described.  Moderate size hiatal hernia.  These results were called by telephone at the time of interpretation on 11/15/2014 at 7:05 pm to Dr. Theotis Burrow , who verbally acknowledged these results.   Electronically Signed   By: Marin Olp M.D.   On: 11/15/2014 19:05    EKG: Independently reviewed.  Assessment/Plan Principal Problem:   SBO (small bowel obstruction) Active Problems:   HTN (hypertension)   CAD (coronary artery  disease), native coronary artery   1. SBO - patient with SBO, likely closed loop obstruction on CT abd/pelvis 1. Surgery consulted and is reviewing CT scan now 2. NGT being placed 3. NPO 4. IVF (holding diuretics) 5. Morphine PRN for pain control 6. SCDs for DVT ppx due to high risk of needing surgery with closed loop obstruction 7. Initial lactate is negative and no evidence of bowel ischemia in CT scan at this point. 2. HTN - continue home meds    Code Status: Full  Family Communication: Family outside room Disposition Plan: Admit to inpatient   Time spent: 70 min  GARDNER, JARED M. [facility] Pager [phone]  If 7AM-7PM, please contact the day team taking care of the patient [email or web address] Password [facility] 11/15/2014, 8:37 PM

## 2014-11-15 NOTE — ED Notes (Signed)
Pt left floor for CT of belly

## 2014-11-15 NOTE — ED Notes (Signed)
Pain started complaining of stomach pain of 10 out 10.1000 ml NS running extra blankets provided for comfort. Family in the room. Pt was vomiting, spoke with Dr. Rex Kras received verbal order for Zofran IV. Gave Zofran

## 2014-11-15 NOTE — Consult Note (Signed)
Chief Complaint:  Nausea and vomiting all day;  SBO on CT  History of Present Illness:  Leslie Frey is an 81 y.o. female who recently moved here from [place] presents with a small bowel obstruction.  She got sick last night and has vomited all day.  CT scan reviewed which shows a couple of areas of stricture in her mid abdomen and the possibility of a closed loop obstruction has been raised.  Her lactic acid levelis not elevated.    She has had multiple surgeries including cholecystectomy and appendectomy, open hiatal hernia repair.  She has some underlying CAD.    Past Medical History  Diagnosis Date  . Hiatal hernia   . Hypertension   . Heart disease   . Hypercholesterolemia   . Arthritis   . IBS (irritable bowel syndrome)   . GERD (gastroesophageal reflux disease)     Past Surgical History  Procedure Laterality Date  . Appendectomy    . Cholecystectomy    . Abdominal hysterectomy    . Cardiac catheterization    . Cataract extraction    . Eye surgery      Current Facility-Administered Medications  Medication Dose Route Frequency Provider Last Rate Last Dose  . 0.9 %  sodium chloride infusion   Intravenous Continuous Etta Quill, DO      . brimonidine (ALPHAGAN) 0.2 % ophthalmic solution 1 drop  1 drop Both Eyes BID Etta Quill, DO       And  . timolol (TIMOPTIC) 0.5 % ophthalmic solution 1 drop  1 drop Both Eyes BID Etta Quill, DO      . carvedilol (COREG) tablet 3.125 mg  3.125 mg Oral BID Etta Quill, DO      . gabapentin (NEURONTIN) capsule 300 mg  300 mg Oral BID Etta Quill, DO      . hydrOXYzine (ATARAX/VISTARIL) tablet 25 mg  25 mg Oral BID PRN Etta Quill, DO      . latanoprost (XALATAN) 0.005 % ophthalmic solution 1 drop  1 drop Both Eyes QHS Etta Quill, DO      . lidocaine (XYLOCAINE) 2 % jelly           . lisinopril (PRINIVIL,ZESTRIL) tablet 5 mg  5 mg Oral Daily Etta Quill, DO      . [START ON 11/16/2014] lubiprostone  (AMITIZA) capsule 24 mcg  24 mcg Oral BID WC Etta Quill, DO      . morphine 2 MG/ML injection 2-4 mg  2-4 mg Intravenous Q4H PRN Etta Quill, DO      . pantoprazole (PROTONIX) EC tablet 80 mg  80 mg Oral Daily Etta Quill, DO      . simvastatin (ZOCOR) tablet 40 mg  40 mg Oral QPM Etta Quill, DO      . traZODone (DESYREL) tablet 50 mg  50 mg Oral QHS Etta Quill, DO       Current Outpatient Prescriptions  Medication Sig Dispense Refill  . AMITIZA 24 MCG capsule Take 1 capsule by mouth 2 (two) times daily.    Marland Kitchen aspirin 81 MG chewable tablet Chew 81 mg by mouth daily.    . Biotin 1000 MCG tablet Take 1,000 mcg by mouth daily.    . bumetanide (BUMEX) 1 MG tablet Take 1 tablet by mouth daily.    . carvedilol (COREG) 3.125 MG tablet Take 3.125 mg by mouth 2 (two) times daily.    Marland Kitchen  COMBIGAN 0.2-0.5 % ophthalmic solution Place 1 drop into both eyes 2 (two) times daily.    Marland Kitchen gabapentin (NEURONTIN) 100 MG capsule Take 300 mg by mouth 2 (two) times daily.    . hydrOXYzine (ATARAX/VISTARIL) 25 MG tablet Take 25 mg by mouth 2 (two) times daily as needed for itching.    Marland Kitchen lisinopril (PRINIVIL,ZESTRIL) 5 MG tablet Take 5 mg by mouth daily.    . nitroGLYCERIN (NITROSTAT) 0.4 MG SL tablet Place 0.4 mg under the tongue every 5 (five) minutes as needed for chest pain.    Marland Kitchen omeprazole (PRILOSEC) 40 MG capsule Take 40 mg by mouth 2 (two) times daily.    Marland Kitchen OVER THE COUNTER MEDICATION "Detoxin with chlorella."    . OVER THE COUNTER MEDICATION "Serovital."    . potassium chloride 20 MEQ/15ML (10%) SOLN Take 45 mLs by mouth daily.    . simvastatin (ZOCOR) 40 MG tablet Take 40 mg by mouth every evening.    . TRAVATAN Z 0.004 % SOLN ophthalmic solution Place 1 drop into both eyes at bedtime.    . traZODone (DESYREL) 50 MG tablet Take 60 mg by mouth daily.    . traZODone (DESYREL) 50 MG tablet Take 50 mg by mouth at bedtime.     Review of patient's allergies indicates no known  allergies. History reviewed. No pertinent family history. Social History:   reports that she has quit smoking. She does not have any smokeless tobacco history on file. She reports that she drinks alcohol. She reports that she does not use illicit drugs.   REVIEW OF SYSTEMS : Negative except for see problem list  Physical Exam:   Blood pressure 149/79, pulse 87, temperature 97.6 F (36.4 C), temperature source Oral, resp. rate 22, SpO2 100 %. There is no height or weight on file to calculate BMI.  Gen:  WDWN WF NAD  Neurological: Alert and oriented to person, place, and time. Motor and sensory function is grossly intact  Head: Normocephalic and atraumatic.  Eyes: Conjunctivae are normal. Pupils are equal, round, and reactive to light. No scleral icterus.  Neck: Normal range of motion. Neck supple. No tracheal deviation or thyromegaly present.  Cardiovascular:  SR  Breast:  Not examined Respiratory: Effort normal.  No respiratory distress. No chest wall tenderness. Breath sounds normal.  No wheezes, rales or rhonchi.  Abdomen:  Mildly protuberant and not obese.  Tender but without rebound or guarding (?pain meds) GU:  No palpable inguinal herniae Musculoskeletal: Normal range of motion. Extremities are nontender. No cyanosis, edema or clubbing noted Lymphadenopathy: No cervical, preauricular, postauricular or axillary adenopathy is present Skin: Skin is warm and dry. No rash noted. No diaphoresis. No erythema. No pallor. Pscyh: Normal mood and affect. Behavior is normal. Judgment and thought content normal.   LABORATORY RESULTS: Results for orders placed or performed during the hospital encounter of 11/15/14 (from the past 48 hour(s))  Basic metabolic panel     Status: Abnormal   Collection Time: 11/15/14  3:04 PM  Result Value Ref Range   Sodium 138 135 - 145 mmol/L   Potassium 3.6 3.5 - 5.1 mmol/L   Chloride 99 (L) 101 - 111 mmol/L   CO2 28 22 - 32 mmol/L   Glucose, Bld 159 (H) 65  - 99 mg/dL   BUN 19 6 - 20 mg/dL   Creatinine, Ser 0.96 0.44 - 1.00 mg/dL   Calcium 10.0 8.9 - 10.3 mg/dL   GFR calc non Af Amer 54 (L) >  60 mL/min   GFR calc Af Amer >60 >60 mL/min    Comment: (NOTE) The eGFR has been calculated using the CKD EPI equation. This calculation has not been validated in all clinical situations. eGFR's persistently <60 mL/min signify possible Chronic Kidney Disease.    Anion gap 11 5 - 15  CBC     Status: Abnormal   Collection Time: 11/15/14  3:04 PM  Result Value Ref Range   WBC 11.9 (H) 4.0 - 10.5 K/uL   RBC 4.52 3.87 - 5.11 MIL/uL   Hemoglobin 14.3 12.0 - 15.0 g/dL   HCT 42.5 36.0 - 46.0 %   MCV 94.0 78.0 - 100.0 fL   MCH 31.6 26.0 - 34.0 pg   MCHC 33.6 30.0 - 36.0 g/dL   RDW 15.9 (H) 11.5 - 15.5 %   Platelets 126 (L) 150 - 400 K/uL  Lipase, blood     Status: None   Collection Time: 11/15/14  3:04 PM  Result Value Ref Range   Lipase 27 22 - 51 U/L  Hepatic function panel     Status: None   Collection Time: 11/15/14  3:04 PM  Result Value Ref Range   Total Protein 7.8 6.5 - 8.1 g/dL   Albumin 4.8 3.5 - 5.0 g/dL   AST 31 15 - 41 U/L   ALT 17 14 - 54 U/L   Alkaline Phosphatase 68 38 - 126 U/L   Total Bilirubin 0.6 0.3 - 1.2 mg/dL   Bilirubin, Direct 0.1 0.1 - 0.5 mg/dL   Indirect Bilirubin 0.5 0.3 - 0.9 mg/dL  I-stat troponin, ED     Status: None   Collection Time: 11/15/14  3:15 PM  Result Value Ref Range   Troponin i, poc 0.01 0.00 - 0.08 ng/mL   Comment 3            Comment: Due to the release kinetics of cTnI, a negative result within the first hours of the onset of symptoms does not rule out myocardial infarction with certainty. If myocardial infarction is still suspected, repeat the test at appropriate intervals.   I-Stat CG4 Lactic Acid, ED     Status: None   Collection Time: 11/15/14  8:07 PM  Result Value Ref Range   Lactic Acid, Venous 1.40 0.5 - 2.0 mmol/L     RADIOLOGY RESULTS: Dg Chest 2 View  11/15/2014   CLINICAL  DATA:  Mid chest pain, weakness, epigastric pain and emesis today.  EXAM: CHEST  2 VIEW  COMPARISON:  None.  FINDINGS: Lungs are hypoinflated without consolidation or effusion. Cardiomediastinal silhouette is within normal. There is calcified plaque over the thoracic aorta. There are mild degenerate changes of the spine.  IMPRESSION: Hypoinflation without acute cardiopulmonary disease.   Electronically Signed   By: Marin Olp M.D.   On: 11/15/2014 15:54   Dg Abd 1 View  11/15/2014   CLINICAL DATA:  Vomiting, nausea, epigastric abdominal pain and severe constipation. Reported history of hiatal hernia, appendectomy, hysterectomy and cholecystectomy.  EXAM: ABDOMEN - 1 VIEW  COMPARISON:  None.  FINDINGS: There are prominently dilated small bowel loops throughout the abdomen measuring up to 5.5 cm name in diameter. There is an absence of colonic gas. No evidence of pneumatosis or pneumoperitoneum. Surgical clips are noted in the right upper quadrant. Curvilinear left upper quadrant calcifications are likely atherosclerotic splenic artery calcifications. Ovoid rim calcified structure overlying the right gluteal soft tissues, likely a gluteal granuloma. Visualized lung bases are clear. Moderate degenerative  changes are seen in the visualized thoracolumbar spine.  IMPRESSION: Prominently dilated small bowel loops throughout the abdomen. Paucity of colonic gas. Findings are most suggestive of a distal small bowel obstruction. Consider further evaluation with CT of the abdomen and pelvis with oral and intravenous contrast.  These results were called by telephone at the time of interpretation on 11/15/2014 at 4:47 pm to Dr. Theotis Burrow , who verbally acknowledged these results.   Electronically Signed   By: Ilona Sorrel M.D.   On: 11/15/2014 16:47   Ct Abdomen Pelvis W Contrast  11/15/2014   CLINICAL DATA:  Nausea and vomiting with last bowel movement 11/12/2014. Previous cholecystectomy and appendectomy as well as  hysterectomy.  EXAM: CT ABDOMEN AND PELVIS WITH CONTRAST  TECHNIQUE: Multidetector CT imaging of the abdomen and pelvis was performed using the standard protocol following bolus administration of intravenous contrast.  CONTRAST:  115mL OMNIPAQUE IOHEXOL 300 MG/ML  SOLN  COMPARISON:  None.  FINDINGS: Lung bases are within normal. Minimal calcification of the mitral valve annulus. Moderate size hiatal hernia is present.  Abdominal images demonstrate surgical absence of the gallbladder. There is a subcentimeter hypodensity over the left lobe of the liver too small to characterize but likely a cyst. The spleen, pancreas and adrenal glands are normal.  Kidneys are normal in size without hydronephrosis or nephrolithiasis. There is a subcentimeter hypodensity over the lower pole and upper pole cortex of the right kidney likely cysts but too small to characterize.  There are multiple dilated fluid in air-filled small bowel loops measuring up to 4.7 cm in diameter compatible with a small bowel obstruction. Site of obstruction is due to two areas of small bowel stricturing over the mid abdomen just anterior to the aortic bifurcation likely due to adhesions. There is obstruction of small bowel proximal to the more proximal stricturing as well as dilatation of a short segment of small bowel between the 2 areas of stricturing likely a closed-loop component. No evidence of pneumatosis or free peritoneal air. No evidence of free fluid. Small bowel distal to these areas of stricturing is decompressed. Colon is decompressed.  Pelvic images demonstrate the bladder and rectum to be within normal. Surgical absence of the uterus. Tiny amount of free fluid in the pelvis.  There mild degenerate changes of the spine and hips.  IMPRESSION: Mid to distal small bowel obstruction likely due to adhesions over the mid abdomen just anterior to the aortic bifurcation. There is dilatation of the small bowel proximal to the more proximal area of  stricturing. There is a second area of small bowel stricturing with a dilated loop caught between these 2 areas of stricturing likely a closed loop component. No pneumatosis or free air.  Subcentimeter liver hypodensity too small to characterize but likely a cyst. Couple subcentimeter right renal cortical hypodensities too small to characterize but likely cysts.  Postsurgical changes as described.  Moderate size hiatal hernia.  These results were called by telephone at the time of interpretation on 11/15/2014 at 7:05 pm to Dr. Theotis Burrow , who verbally acknowledged these results.   Electronically Signed   By: Marin Olp M.D.   On: 11/15/2014 19:05    Problem List: Patient Active Problem List   Diagnosis Date Noted  . SBO (small bowel obstruction) 11/15/2014  . HTN (hypertension) 11/15/2014  . CAD (coronary artery disease), native coronary artery 11/15/2014    Assessment & Plan: Small bowel obstruction in an elderly woman with a history of multiple  surgeries.  Agree with NG and observation;  [facility] will follow with you    Matt B. Hassell Done, MD, [facility] [phone] beeper [phone]  11/15/2014 9:18 PM

## 2014-11-15 NOTE — Progress Notes (Signed)
Pt and family oriented to room and unit. NG hooked to LI suction and secured. Pt wants to allow medical information to be shared over the phone with her daughter. Spoke with MD who suggested we hold PO medications for tonight. Pt resting comfortably, will continue to monitor.

## 2014-11-15 NOTE — ED Notes (Signed)
Patient with Hx of cardiac catheterization onset today at 0000 c/o epigastric abdominal pain, emesis. Pt denies diarrhea.

## 2014-11-15 NOTE — Progress Notes (Signed)
EDCM spoke to patient's family members at bedside.  Patient asleep at this time.  Patient listed as having Medicare insurance without a pcp.  EDCm provided patient's family member with list of pcps who acccept Medicare insurance within a ten mile radius of patient's zip code [phone].  Female at bedside also requesting list of pcps who accept [employer].  EDCM provided this list as well.  EDCM also instructed patient's family member to call the phone number on the back of patient's  insurance card to help them find a provider near them.  Patient's family report patient has just moved here from [place].  Patient's family thankful for resources.  No further EDCM needs at this time.

## 2014-11-16 ENCOUNTER — Inpatient Hospital Stay (HOSPITAL_COMMUNITY): Payer: Medicare Other

## 2014-11-16 DIAGNOSIS — R112 Nausea with vomiting, unspecified: Secondary | ICD-10-CM

## 2014-11-16 DIAGNOSIS — E785 Hyperlipidemia, unspecified: Secondary | ICD-10-CM | POA: Diagnosis present

## 2014-11-16 LAB — URINALYSIS, ROUTINE W REFLEX MICROSCOPIC
Bilirubin Urine: NEGATIVE
GLUCOSE, UA: NEGATIVE mg/dL
Hgb urine dipstick: NEGATIVE
Ketones, ur: 15 mg/dL — AB
LEUKOCYTES UA: NEGATIVE
Nitrite: NEGATIVE
PH: 6 (ref 5.0–8.0)
PROTEIN: 30 mg/dL — AB
Specific Gravity, Urine: >1.046 — ABNORMAL HIGH (ref 1.005–1.030)
Urobilinogen, UA: 0.2 mg/dL (ref 0.0–1.0)

## 2014-11-16 LAB — URINE MICROSCOPIC-ADD ON

## 2014-11-16 MED ORDER — METOPROLOL TARTRATE 1 MG/ML IV SOLN
2.5000 mg | Freq: Four times a day (QID) | INTRAVENOUS | Status: DC
  Administered 2014-11-16 – 2014-11-19 (×9): 2.5 mg via INTRAVENOUS
  Filled 2014-11-16 (×18): qty 5

## 2014-11-16 MED ORDER — HYDRALAZINE HCL 20 MG/ML IJ SOLN: 5.0000 mg | Freq: Four times a day (QID) | INTRAMUSCULAR | Status: DC | PRN

## 2014-11-16 MED ORDER — HYDROMORPHONE HCL 1 MG/ML IJ SOLN
1.0000 mg | INTRAMUSCULAR | Status: DC | PRN
  Administered 2014-11-16 – 2014-11-24 (×11): 1 mg via INTRAVENOUS
  Filled 2014-11-16 (×11): qty 1

## 2014-11-16 MED ORDER — PHENOL 1.4 % MT LIQD
1.0000 | OROMUCOSAL | Status: DC | PRN
  Administered 2014-11-16: 1 via OROMUCOSAL
  Filled 2014-11-16: qty 177

## 2014-11-16 MED ORDER — MENTHOL 3 MG MT LOZG
1.0000 | LOZENGE | OROMUCOSAL | Status: DC | PRN
  Filled 2014-11-16: qty 9

## 2014-11-16 NOTE — Progress Notes (Signed)
Subjective: She is on bedside commode trying to void, but not able to.  She had something similar about a year ago, but sounds like after admit she got better.  She just moved here so her daughter here was not in [place] when this happened.  She feels like she needs to void but cannot. NG cannister is almost full. It sounds like she has chronic abdominal pain and she takes Neurontin for it.  She had an MI several years ago also. Objective: Vital signs in last 24 hours: Temp:  [97.3 F (36.3 C)-98.5 F (36.9 C)] 97.3 F (36.3 C) (09/20 0614) Pulse Rate:  [43-87] 43 (09/20 0614) Resp:  [18-22] 18 (09/20 0614) BP: (122-152)/(62-79) 122/65 mmHg (09/20 0614) SpO2:  [96 %-100 %] 96 % (09/20 0614) Last BM Date: 11/12/14 1200 from NG last PM 300 more this Am record Afebrile, VSS No labs this AM Intake/Output from previous day: 09/19 0701 - 09/20 0700 In: -  Out: 1200 [Emesis/NG output:1200] Intake/Output this shift: Total I/O In: -  Out: 300 [Emesis/NG output:300]  General appearance: alert, cooperative, mild distress and still having pain and uncomfortable. GI: sitting up on bedside commode, uncomfortable, NO BS.  Lab Results:   Recent Labs  11/15/14 1504  WBC 11.9*  HGB 14.3  HCT 42.5  PLT 126*    BMET  Recent Labs  11/15/14 1504  NA 138  K 3.6  CL 99*  CO2 28  GLUCOSE 159*  BUN 19  CREATININE 0.96  CALCIUM 10.0   PT/INR No results for input(s): LABPROT, INR in the last 72 hours.   Recent Labs Lab 11/15/14 1504  AST 31  ALT 17  ALKPHOS 68  BILITOT 0.6  PROT 7.8  ALBUMIN 4.8     Lipase     Component Value Date/Time   LIPASE 27 11/15/2014 1504     Studies/Results: Dg Chest 2 View  11/15/2014   CLINICAL DATA:  Mid chest pain, weakness, epigastric pain and emesis today.  EXAM: CHEST  2 VIEW  COMPARISON:  None.  FINDINGS: Lungs are hypoinflated without consolidation or effusion. Cardiomediastinal silhouette is within normal. There is calcified plaque  over the thoracic aorta. There are mild degenerate changes of the spine.  IMPRESSION: Hypoinflation without acute cardiopulmonary disease.   Electronically Signed   By: Marin Olp M.D.   On: 11/15/2014 15:54   Dg Abd 1 View  11/15/2014   CLINICAL DATA:  Vomiting, nausea, epigastric abdominal pain and severe constipation. Reported history of hiatal hernia, appendectomy, hysterectomy and cholecystectomy.  EXAM: ABDOMEN - 1 VIEW  COMPARISON:  None.  FINDINGS: There are prominently dilated small bowel loops throughout the abdomen measuring up to 5.5 cm name in diameter. There is an absence of colonic gas. No evidence of pneumatosis or pneumoperitoneum. Surgical clips are noted in the right upper quadrant. Curvilinear left upper quadrant calcifications are likely atherosclerotic splenic artery calcifications. Ovoid rim calcified structure overlying the right gluteal soft tissues, likely a gluteal granuloma. Visualized lung bases are clear. Moderate degenerative changes are seen in the visualized thoracolumbar spine.  IMPRESSION: Prominently dilated small bowel loops throughout the abdomen. Paucity of colonic gas. Findings are most suggestive of a distal small bowel obstruction. Consider further evaluation with CT of the abdomen and pelvis with oral and intravenous contrast.  These results were called by telephone at the time of interpretation on 11/15/2014 at 4:47 pm to Dr. Theotis Burrow , who verbally acknowledged these results.   Electronically Signed  By: Ilona Sorrel M.D.   On: 11/15/2014 16:47   Ct Abdomen Pelvis W Contrast  11/15/2014   CLINICAL DATA:  Nausea and vomiting with last bowel movement 11/12/2014. Previous cholecystectomy and appendectomy as well as hysterectomy.  EXAM: CT ABDOMEN AND PELVIS WITH CONTRAST  TECHNIQUE: Multidetector CT imaging of the abdomen and pelvis was performed using the standard protocol following bolus administration of intravenous contrast.  CONTRAST:  166mL OMNIPAQUE  IOHEXOL 300 MG/ML  SOLN  COMPARISON:  None.  FINDINGS: Lung bases are within normal. Minimal calcification of the mitral valve annulus. Moderate size hiatal hernia is present.  Abdominal images demonstrate surgical absence of the gallbladder. There is a subcentimeter hypodensity over the left lobe of the liver too small to characterize but likely a cyst. The spleen, pancreas and adrenal glands are normal.  Kidneys are normal in size without hydronephrosis or nephrolithiasis. There is a subcentimeter hypodensity over the lower pole and upper pole cortex of the right kidney likely cysts but too small to characterize.  There are multiple dilated fluid in air-filled small bowel loops measuring up to 4.7 cm in diameter compatible with a small bowel obstruction. Site of obstruction is due to two areas of small bowel stricturing over the mid abdomen just anterior to the aortic bifurcation likely due to adhesions. There is obstruction of small bowel proximal to the more proximal stricturing as well as dilatation of a short segment of small bowel between the 2 areas of stricturing likely a closed-loop component. No evidence of pneumatosis or free peritoneal air. No evidence of free fluid. Small bowel distal to these areas of stricturing is decompressed. Colon is decompressed.  Pelvic images demonstrate the bladder and rectum to be within normal. Surgical absence of the uterus. Tiny amount of free fluid in the pelvis.  There mild degenerate changes of the spine and hips.  IMPRESSION: Mid to distal small bowel obstruction likely due to adhesions over the mid abdomen just anterior to the aortic bifurcation. There is dilatation of the small bowel proximal to the more proximal area of stricturing. There is a second area of small bowel stricturing with a dilated loop caught between these 2 areas of stricturing likely a closed loop component. No pneumatosis or free air.  Subcentimeter liver hypodensity too small to characterize but  likely a cyst. Couple subcentimeter right renal cortical hypodensities too small to characterize but likely cysts.  Postsurgical changes as described.  Moderate size hiatal hernia.  These results were called by telephone at the time of interpretation on 11/15/2014 at 7:05 pm to Dr. Theotis Burrow , who verbally acknowledged these results.   Electronically Signed   By: Marin Olp M.D.   On: 11/15/2014 19:05    Medications: . antiseptic oral rinse  7 mL Mouth Rinse q12n4p  . brimonidine  1 drop Both Eyes BID   And  . timolol  1 drop Both Eyes BID  . carvedilol  3.125 mg Oral BID  . chlorhexidine  15 mL Mouth Rinse BID  . gabapentin  300 mg Oral BID  . latanoprost  1 drop Both Eyes QHS  . lisinopril  5 mg Oral Daily  . lubiprostone  24 mcg Oral BID WC  . traZODone  50 mg Oral QHS   . sodium chloride 100 mL/hr at 11/16/14 0331   Prior to Admission medications   Medication Sig Start Date End Date Taking? Authorizing Provider  AMITIZA 24 MCG capsule Take 1 capsule by mouth 2 (two) times  daily. 09/07/14  Yes Historical Provider, MD  aspirin 81 MG chewable tablet Chew 81 mg by mouth daily.   Yes Historical Provider, MD  Biotin 1000 MCG tablet Take 1,000 mcg by mouth daily.   Yes Historical Provider, MD  bumetanide (BUMEX) 1 MG tablet Take 1 tablet by mouth daily. 08/31/14  Yes Historical Provider, MD  carvedilol (COREG) 3.125 MG tablet Take 3.125 mg by mouth 2 (two) times daily. 08/31/14  Yes Historical Provider, MD  COMBIGAN 0.2-0.5 % ophthalmic solution Place 1 drop into both eyes 2 (two) times daily. 10/15/14  Yes Historical Provider, MD  gabapentin (NEURONTIN) 100 MG capsule Take 300 mg by mouth 2 (two) times daily.   Yes Historical Provider, MD  hydrOXYzine (ATARAX/VISTARIL) 25 MG tablet Take 25 mg by mouth 2 (two) times daily as needed for itching.   Yes Historical Provider, MD  lisinopril (PRINIVIL,ZESTRIL) 5 MG tablet Take 5 mg by mouth daily. 08/31/14  Yes Historical Provider, MD   nitroGLYCERIN (NITROSTAT) 0.4 MG SL tablet Place 0.4 mg under the tongue every 5 (five) minutes as needed for chest pain.   Yes Historical Provider, MD  omeprazole (PRILOSEC) 40 MG capsule Take 40 mg by mouth 2 (two) times daily. 10/15/14  Yes Historical Provider, MD  OVER THE COUNTER MEDICATION "Detoxin with chlorella."   Yes Historical Provider, MD  OVER THE COUNTER MEDICATION "Serovital."   Yes Historical Provider, MD  potassium chloride 20 MEQ/15ML (10%) SOLN Take 45 mLs by mouth daily. 10/15/14  Yes Historical Provider, MD  simvastatin (ZOCOR) 40 MG tablet Take 40 mg by mouth every evening. 09/27/14  Yes Historical Provider, MD  TRAVATAN Z 0.004 % SOLN ophthalmic solution Place 1 drop into both eyes at bedtime. 10/15/14  Yes Historical Provider, MD  traZODone (DESYREL) 50 MG tablet Take 60 mg by mouth daily. 10/15/14  Yes Historical Provider, MD  traZODone (DESYREL) 50 MG tablet Take 50 mg by mouth at bedtime.   Yes Historical Provider, MD    Assessment/Plan SBO with hx of 3 abdominal surgeries Hx Hypertension Hx of CAD with hx of MI IBS Chronic constipation Chronic abdominal pain Hiatal hernia/GERD   Plan:  I will get a films on her and see if the contrast has moved.  With NG output I doubt it. I will ask them to get old Medical records from [facility] if we can.  If she comes to surgery she would need medical and cardiac clearance.      LOS: 1 day    JENNINGS,WILLARD 11/16/2014

## 2014-11-16 NOTE — Progress Notes (Signed)
Initial Nutrition Assessment  DOCUMENTATION CODES:   Not applicable  INTERVENTION:  - Will monitor for plan of care and needs based on plan - RD will continue to follow  NUTRITION DIAGNOSIS:   Inadequate oral intake related to inability to eat as evidenced by NPO status.  GOAL:   Patient will meet greater than or equal to 90% of their needs  MONITOR:   Diet advancement, Weight trends, Labs, I & O's  REASON FOR ASSESSMENT:   Malnutrition Screening Tool  ASSESSMENT:   81 y.o. female with h/o HTN, HLD, hiatal hernia. Patient presents to ED with epigastric pain and vomiting. Vomiting has been non-stop since last evening. Has been having trouble with constipation over last week or 2 and has been many days since last BM. Pain is moderate in intensity, located in epigastric area, non-radiating.  Pt seen for MST. BMI not on file as no height recorded. Weight found in this note was provided by pt as a recent and UBW. Pt is unsure if her weight has fluctuated recently.   Pt has been NPO since admission with NGT to suction placed last night. Wall canister full at time of RD visit. Pt denies abdominal pain after Morphine given recently and denies nausea. She states PTA she had abdominal pain x1.5 days with no associated nausea or vomiting.   Pt's daughter at bedside reports pt began living with her 1 month ago. She states pt has never been a big eater and that she has not noticed any changes in appetite recently. Pt denies swallowing difficulty but states she has problems chewing meat and needs to "chew until it does not even taste good."   No muscle or fat wasting present at this time. She states she had ice chips today which she tolerated well; will monitor for ability for diet to be advanced. Not meeting needs. Medications reviewed. Labs reviewed; Cl: 99 mmol/L.   Diet Order:  Diet NPO time specified  Skin:  Reviewed, no issues  Last BM:  9/16  Height:   Ht Readings from Last  1 Encounters:  No data found for Ht    Weight:   Wt Readings from Last 1 Encounters:  11/16/14 138 lb (62.596 kg)    Ideal Body Weight:     BMI:  There is no height on file to calculate BMI.  Estimated Nutritional Needs:   Kcal:  1300-1500  Protein:  50-60 grams  Fluid:  2-2.2 L/day  EDUCATION NEEDS:   No education needs identified at this time     Jarome Matin, RD, LDN Inpatient Clinical Dietitian Pager # [phone] After hours/weekend pager # [phone]

## 2014-11-16 NOTE — Progress Notes (Signed)
Patient ID: Leslie Frey, female   DOB: 1932/04/14, 81 y.o.   MRN: [id number] [facility] PROGRESS NOTE  Leslie Frey UEK:[id number] DOB: 21-Aug-1932 DOA: 11/15/2014 PCP: No primary care provider on file. follows in [place]  Brief narrative:    81 y.o. female with past medical history of hypertension, dyslipidemia, CAD who presented to [facility] ED with persistent nausea and vomiting for past day or so prior to this admission. She also has reported epigastric pain for same of amount of time. She was subsequently found to have small bowel obstruction.  Anticipated discharge: home once bowel obstruction resolves.   Assessment/Plan:    Principal Problem:   SBO (small bowel obstruction) secondary to adhesions / Nausea and vomiting / Epigastric pain - Pt presented with N/V, abdominal pain. Abd x ray demonstrated distal SBO.  - CT abdomen demonstrated mid to distal small bowel obstruction secondary to adhesions - Appreciate surgery following. - Continue conservative management with NG tube for decompression, IV fluids for hydration - Continue pain management efforts - Continue antiemetics as needed   Active Problems:   HTN (hypertension), essential  - Stopped by mouth blood pressure medications considering the patient is nothing by mouth. Start metoprolol 2.5 mg IV every 6 hours and hydralazine 5 mg every 6 hours as needed if blood pressures above 150/90    CAD (coronary artery disease), native coronary artery - Stable. No complaints of chest pain.    Dyslipidemia - Resume statin therapy once able to tolerate by mouth intake.   DVT Prophylaxis  - SCD's bilaterally    Code Status: Full.  Family Communication:  plan of care discussed with the patient Disposition Plan: Home once SBO resolves.   IV access:  Peripheral IV  Procedures and diagnostic studies:    Dg Chest 2 View 11/15/2014   Hypoinflation without acute cardiopulmonary disease.   Electronically Signed   By: Leslie Frey M.D.   On:  11/15/2014 15:54   Dg Abd 1 View 11/15/2014   Prominently dilated small bowel loops throughout the abdomen. Paucity of colonic gas. Findings are most suggestive of a distal small bowel obstruction. Consider further evaluation with CT of the abdomen and pelvis with oral and intravenous contrast.  These results were called by telephone at the time of interpretation on 11/15/2014 at 4:47 pm to Dr. Theotis Burrow , who verbally acknowledged these results.   Electronically Signed   By: Leslie Frey M.D.   On: 11/15/2014 16:47   Ct Abdomen Pelvis W Contrast 11/15/2014   Mid to distal small bowel obstruction likely due to adhesions over the mid abdomen just anterior to the aortic bifurcation. There is dilatation of the small bowel proximal to the more proximal area of stricturing. There is a second area of small bowel stricturing with a dilated loop caught between these 2 areas of stricturing likely a closed loop component. No pneumatosis or free air.  Subcentimeter liver hypodensity too small to characterize but likely a cyst. Couple subcentimeter right renal cortical hypodensities too small to characterize but likely cysts.  Postsurgical changes as described.  Moderate size hiatal hernia.  These results were called by telephone at the time of interpretation on 11/15/2014 at 7:05 pm to Dr. Theotis Burrow , who verbally acknowledged these results.   Electronically Signed   By: Leslie Frey M.D.   On: 11/15/2014 19:05    Medical Consultants:  Surgery   Other Consultants:  None   IAnti-Infectives:   None    Leslie Lenz, MD  [facility] Pager [phone]  Time spent in minutes: 25 minutes  If 7PM-7AM, please contact night-coverage [email or web address] Password [email or web address] 11/16/2014, 11:11 AM   LOS: 1 day    HPI/Subjective: No acute overnight events. Patient reports abdominal pain 6/10 this am.   Objective: Filed Vitals:   11/15/14 1500 11/15/14 1735 11/15/14 2217 11/16/14 0614  BP: 152/66 149/79 146/62  122/65  Pulse: 51 87 46 43  Temp: 98.3 F (36.8 C) 97.6 F (36.4 C) 98.5 F (36.9 C) 97.3 F (36.3 C)  TempSrc: Oral Oral Oral Oral  Resp: 18 22 20 18   SpO2: 99% 100% 97% 96%    Intake/Output Summary (Last 24 hours) at 11/16/14 1111 Last data filed at 11/16/14 1020  Gross per 24 hour  Intake      0 ml  Output   1550 ml  Net  -1550 ml    Exam:   General:  Pt is alert, follows commands appropriately, not in acute distress  Cardiovascular: Regular rate and rhythm, S1/S2 appreciated   Respiratory: Clear to auscultation bilaterally, no wheezing, no crackles, no rhonchi  Abdomen: Soft, non distended, bowel sounds present; NG in place   Extremities: No edema, pulses DP and PT palpable bilaterally  Neuro: Grossly nonfocal  Data Reviewed: Basic Metabolic Panel:  Recent Labs Lab 11/15/14 1504  NA 138  K 3.6  CL 99*  CO2 28  GLUCOSE 159*  BUN 19  CREATININE 0.96  CALCIUM 10.0   Liver Function Tests:  Recent Labs Lab 11/15/14 1504  AST 31  ALT 17  ALKPHOS 68  BILITOT 0.6  PROT 7.8  ALBUMIN 4.8    Recent Labs Lab 11/15/14 1504  LIPASE 27   No results for input(s): AMMONIA in the last 168 hours. CBC:  Recent Labs Lab 11/15/14 1504  WBC 11.9*  HGB 14.3  HCT 42.5  MCV 94.0  PLT 126*   Cardiac Enzymes: No results for input(s): CKTOTAL, CKMB, CKMBINDEX, TROPONINI in the last 168 hours. BNP: Invalid input(s): POCBNP CBG: No results for input(s): GLUCAP in the last 168 hours.  No results found for this or any previous visit (from the past 240 hour(s)).   Scheduled Meds: . carvedilol  3.125 mg Oral BID  . gabapentin  300 mg Oral BID  . lisinopril  5 mg Oral Daily  . traZODone  50 mg Oral QHS   Continuous Infusions: . sodium chloride 100 mL/hr at 11/16/14 0331

## 2014-11-16 NOTE — Progress Notes (Signed)
Utilization Review Complete  

## 2014-11-17 ENCOUNTER — Inpatient Hospital Stay (HOSPITAL_COMMUNITY): Payer: Medicare Other

## 2014-11-17 DIAGNOSIS — D72829 Elevated white blood cell count, unspecified: Secondary | ICD-10-CM | POA: Diagnosis present

## 2014-11-17 DIAGNOSIS — D696 Thrombocytopenia, unspecified: Secondary | ICD-10-CM | POA: Diagnosis present

## 2014-11-17 DIAGNOSIS — E876 Hypokalemia: Secondary | ICD-10-CM | POA: Diagnosis present

## 2014-11-17 LAB — CBC
HCT: 37.9 % (ref 36.0–46.0)
Hemoglobin: 12.5 g/dL (ref 12.0–15.0)
MCH: 30.9 pg (ref 26.0–34.0)
MCHC: 33 g/dL (ref 30.0–36.0)
MCV: 93.8 fL (ref 78.0–100.0)
PLATELETS: 133 10*3/uL — AB (ref 150–400)
RBC: 4.04 MIL/uL (ref 3.87–5.11)
RDW: 16.1 % — AB (ref 11.5–15.5)
WBC: 10 10*3/uL (ref 4.0–10.5)

## 2014-11-17 LAB — BASIC METABOLIC PANEL
Anion gap: 7 (ref 5–15)
BUN: 16 mg/dL (ref 6–20)
CO2: 30 mmol/L (ref 22–32)
CREATININE: 0.88 mg/dL (ref 0.44–1.00)
Calcium: 8.7 mg/dL — ABNORMAL LOW (ref 8.9–10.3)
Chloride: 104 mmol/L (ref 101–111)
GFR calc Af Amer: >60 mL/min (ref >60)
GLUCOSE: 107 mg/dL — AB (ref 65–99)
POTASSIUM: 3.1 mmol/L — AB (ref 3.5–5.1)
Sodium: 141 mmol/L (ref 135–145)

## 2014-11-17 LAB — APTT: aPTT: 32 seconds (ref 24–37)

## 2014-11-17 LAB — ABO/RH: ABO/RH(D): A POS

## 2014-11-17 LAB — URINE CULTURE

## 2014-11-17 LAB — PROTIME-INR
INR: 1.07 (ref 0.00–1.49)
Prothrombin Time: 14.1 seconds (ref 11.6–15.2)

## 2014-11-17 LAB — TYPE AND SCREEN
ABO/RH(D): A POS
Antibody Screen: NEGATIVE

## 2014-11-17 MED ORDER — POTASSIUM CHLORIDE 10 MEQ/100ML IV SOLN
10.0000 meq | INTRAVENOUS | Status: AC
  Administered 2014-11-17 (×3): 10 meq via INTRAVENOUS
  Filled 2014-11-17 (×3): qty 100

## 2014-11-17 NOTE — Progress Notes (Addendum)
Patient ID: Leslie Frey, female   DOB: Leslie [birth year], 81 y.o.   MRN: [id number] [facility] PROGRESS NOTE  Leslie Frey TML:[id number] DOB: Leslie Frey, Leslie Frey DOA: 11/15/2014 PCP: No primary care provider on file. Patient follows in [place]  Brief narrative:    81 y.o. female with past medical history of hypertension, dyslipidemia, CAD who presented to [facility] ED with persistent nausea and vomiting associated with epigastric pain. She was subsequently found to have small bowel obstruction. She has been seen by surgery in consultation who recommended conservative management. AS of 9/21 they will continue to monitor NG tube output and is now significant improvement than patient will most likely require surgery.  Anticipated discharge: home once bowel obstruction resolves.   Assessment/Plan:    Principal Problem:   SBO (small bowel obstruction) secondary to adhesions / Nausea and vomiting / Epigastric pain - Patient presented with abdominal pain, nausea, vomiting. - Patient was subsequently found to have small bowel obstruction as seen on abdominal x-ray. - CT abdomen on the admission confirmed small bowel obstruction thought to be secondary to adhesions. - Patient has significant NG tube output, about 2 L in past 24 hours. - Abdominal x-ray repeated today 11/17/2014 and shows less amount of dilated loops but still dilated small bowel loops present throughout. - Surgery recommends continuing to monitor her NG tube output and if no significant improvement patient will most likely require surgery. Appreciate surgery input very much. - Continue IV fluids for hydration. - Continue antiemetics and analgesia as needed.  Active Problems:   HTN (hypertension), essential  - Patient home medication stopped because she is nothing by mouth. We are using metoprolol 2.5 mg IV every 6 hours scheduled in addition to hydralazine 5 mg every 6 hours as needed if blood pressures above 150/90 - Off note, at home she takes  carvedilol 3.125 mg twice daily and lisinopril 5 mg daily.    Hypokalemia - Secondary to GI losses. Supplemented this morning. - Follow-up BMP tomorrow morning.    Leukocytosis - Likely reactive, from small bowel obstruction - No source of acute infection, no fevers - White blood cell count spontaneously resolved to within normal limits.    Thrombocytopenia - Mild, unclear etiology. Platelets are 133 this morning. - Using SCDs bilaterally for DVT prophylaxis.    CAD (coronary artery disease), native coronary artery - Stable.  - No reports of chest pain. - Patient is on daily aspirin at home which was put on hold since she is nothing by mouth. Resume as soon as she tolerates by mouth intake.    Dyslipidemia - Resume statin therapy once patient resumes by mouth intake.   DVT Prophylaxis  - SCD's bilaterally in hospital.   Code Status: Full.  Family Communication:  plan of care discussed with the patient and her daughter at the bedside. Disposition Plan: Home once bowel obstruction resolves.  IV access:  Peripheral IV  Procedures and diagnostic studies:    Dg Chest 2 View 11/15/2014   Hypoinflation without acute cardiopulmonary disease.   Electronically Signed   By: Marin Olp M.D.   On: 11/15/2014 15:54   Dg Abd 1 View 11/15/2014   Prominently dilated small bowel loops throughout the abdomen. Paucity of colonic gas. Findings are most suggestive of a distal small bowel obstruction. Consider further evaluation with CT of the abdomen and pelvis with oral and intravenous contrast.  These results were called by telephone at the time of interpretation on 11/15/2014 at 4:47 pm to Dr. Theotis Burrow ,  who verbally acknowledged these results.   Electronically Signed   By: Ilona Sorrel M.D.   On: 11/15/2014 16:47   Ct Abdomen Pelvis W Contrast 11/15/2014   Mid to distal small bowel obstruction likely due to adhesions over the mid abdomen just anterior to the aortic bifurcation. There is  dilatation of the small bowel proximal to the more proximal area of stricturing. There is a second area of small bowel stricturing with a dilated loop caught between these 2 areas of stricturing likely a closed loop component. No pneumatosis or free air.  Subcentimeter liver hypodensity too small to characterize but likely a cyst. Couple subcentimeter right renal cortical hypodensities too small to characterize but likely cysts.  Postsurgical changes as described.  Moderate size hiatal hernia.  These results were called by telephone at the time of interpretation on 11/15/2014 at 7:05 pm to Dr. Theotis Burrow , who verbally acknowledged these results.   Electronically Signed   By: Marin Olp M.D.   On: 11/15/2014 19:05    Medical Consultants:  Surgery   Other Consultants:  None   IAnti-Infectives:   None    Leisa Lenz, MD  [facility] Pager [phone]  Time spent in minutes: 25 minutes  If 7PM-7AM, please contact night-coverage [email or web address] Password [facility] 11/17/2014, 10:49 AM   LOS: 2 days    HPI/Subjective: No acute overnight events. Patient reports pain is slightly better this morning.  Objective: Filed Vitals:   11/16/14 1423 11/16/14 2028 11/16/14 2206 11/17/14 0612  BP: 110/60  125/67 113/53  Pulse: 68  75 65  Temp: 98.2 F (36.8 C)  98.1 F (36.7 C) 98.5 F (36.9 C)  TempSrc: Oral  Oral Oral  Resp: 18  18 18   Height:  5' (1.524 m)    Weight:      SpO2: 97%  100% 100%    Intake/Output Summary (Last 24 hours) at 11/17/14 1049 Last data filed at 11/17/14 0521  Gross per 24 hour  Intake      0 ml  Output   1400 ml  Net  -1400 ml    Exam:   General:  Pt is alert, no acute distress  Cardiovascular: Rate controlled, appreciate S1-S2  Respiratory: Bilateral air entry, no wheezing  Abdomen: Tender in mid abdomen to deep palpation, no bowel sounds  Extremities: No leg swelling, pulses palpable  Neuro: No focal deficit  Data Reviewed: Basic  Metabolic Panel:  Recent Labs Lab 11/15/14 1504 11/17/14 0905  NA 138 141  K 3.6 3.1*  CL 99* 104  CO2 28 30  GLUCOSE 159* 107*  BUN 19 16  CREATININE 0.96 0.88  CALCIUM 10.0 8.7*   Liver Function Tests:  Recent Labs Lab 11/15/14 1504  AST 31  ALT 17  ALKPHOS 68  BILITOT 0.6  PROT 7.8  ALBUMIN 4.8    Recent Labs Lab 11/15/14 1504  LIPASE Frey   No results for input(s): AMMONIA in the last 168 hours. CBC:  Recent Labs Lab 11/15/14 1504 11/17/14 0905  WBC 11.9* 10.0  HGB 14.3 12.5  HCT 42.5 37.9  MCV 94.0 93.8  PLT 126* 133*   Cardiac Enzymes: No results for input(s): CKTOTAL, CKMB, CKMBINDEX, TROPONINI in the last 168 hours. BNP: Invalid input(s): POCBNP CBG: No results for input(s): GLUCAP in the last 168 hours.  Recent Results (from the past 240 hour(s))  Culture, Urine     Status: None (Preliminary result)   Collection Time: 11/16/14 11:57 AM  Result Value  Ref Range Status   Specimen Description URINE, RANDOM  Final   Special Requests NONE  Final   Culture   Final    TOO YOUNG TO READ Performed at [facility]    Report Status PENDING  Incomplete     Scheduled Meds: . carvedilol  3.125 mg Oral BID  . gabapentin  300 mg Oral BID  . lisinopril  5 mg Oral Daily  . traZODone  50 mg Oral QHS   Continuous Infusions: . sodium chloride 100 mL/hr at 11/16/14 2355

## 2014-11-17 NOTE — Progress Notes (Signed)
  Progress Note: General Surgery Service   Subjective: Abdominal pain present but improved, no bm or flatus.  Objective: Vital signs in last 24 hours: Temp:  [98.1 F (36.7 C)-98.5 F (36.9 C)] 98.5 F (36.9 C) (09/21 0612) Pulse Rate:  [65-75] 65 (09/21 0612) Resp:  [18] 18 (09/21 0612) BP: (110-125)/(53-67) 113/53 mmHg (09/21 0612) SpO2:  [97 %-100 %] 100 % (09/21 0612) Weight:  [62.596 kg (138 lb)] 62.596 kg (138 lb) (09/20 1348) Last BM Date: 11/12/14  Intake/Output from previous day: 09/20 0701 - 09/21 0700 In: -  Out: 1750 [Urine:50; Emesis/NG output:1700] Intake/Output this shift:    Lungs: CTAB  Cardiovascular: RRR  Abd: soft, slight distension, NT, NG in place with clear orange liquid  Extremities: no edema  Neuro: AOx4  Lab Results: CBC   Recent Labs  11/15/14 1504  WBC 11.9*  HGB 14.3  HCT 42.5  PLT 126*   BMET  Recent Labs  11/15/14 1504  NA 138  K 3.6  CL 99*  CO2 28  GLUCOSE 159*  BUN 19  CREATININE 0.96  CALCIUM 10.0   PT/INR No results for input(s): LABPROT, INR in the last 72 hours. ABG No results for input(s): PHART, HCO3 in the last 72 hours.  Invalid input(s): PCO2, PO2  Studies/Results:  Anti-infectives: Anti-infectives    None      Medications: Scheduled Meds: . antiseptic oral rinse  7 mL Mouth Rinse q12n4p  . brimonidine  1 drop Both Eyes BID   And  . timolol  1 drop Both Eyes BID  . chlorhexidine  15 mL Mouth Rinse BID  . gabapentin  300 mg Oral BID  . latanoprost  1 drop Both Eyes QHS  . lubiprostone  24 mcg Oral BID WC  . metoprolol  2.5 mg Intravenous 4 times per day  . traZODone  50 mg Oral QHS   Continuous Infusions: . sodium chloride 100 mL/hr at 11/16/14 2355   PRN Meds:.hydrALAZINE, HYDROmorphone (DILAUDID) injection, hydrOXYzine, menthol-cetylpyridinium, phenol  Assessment/Plan: Patient Active Problem List   Diagnosis Date Noted  . Nausea and vomiting 11/16/2014  . Dyslipidemia  11/16/2014  . SBO (small bowel obstruction) 11/15/2014  . HTN (hypertension) 11/15/2014  . CAD (coronary artery disease), native coronary artery 11/15/2014   s/p  * No surgery found * SBO liquid no longer bilious, will continue nonoperative management -if does not decrease output or have colonic movement will explore tomorrow -continue NPO -continue NG tube   LOS: 2 days   Mickeal Skinner, MD Pg# [phone] [facility]

## 2014-11-17 NOTE — Care Management Important Message (Signed)
Important Message  Patient Details  Name: Leslie Frey MRN: [id number] Date of Birth: 06/23/1932   Medicare Important Message Given:  Yes-second notification given    Camillo Flaming 11/17/2014, 11:54 AMImportant Message  Patient Details  Name: Leslie Frey MRN: [id number] Date of Birth: 08/24/1932   Medicare Important Message Given:  Yes-second notification given    Camillo Flaming 11/17/2014, 11:53 AM

## 2014-11-18 ENCOUNTER — Encounter (HOSPITAL_COMMUNITY): Payer: Self-pay | Admitting: Student

## 2014-11-18 ENCOUNTER — Inpatient Hospital Stay (HOSPITAL_COMMUNITY): Payer: Medicare Other

## 2014-11-18 DIAGNOSIS — I10 Essential (primary) hypertension: Secondary | ICD-10-CM

## 2014-11-18 DIAGNOSIS — K5669 Other intestinal obstruction: Secondary | ICD-10-CM

## 2014-11-18 DIAGNOSIS — I2510 Atherosclerotic heart disease of native coronary artery without angina pectoris: Secondary | ICD-10-CM

## 2014-11-18 DIAGNOSIS — D649 Anemia, unspecified: Secondary | ICD-10-CM | POA: Diagnosis not present

## 2014-11-18 LAB — BASIC METABOLIC PANEL
Anion gap: 8 (ref 5–15)
BUN: 15 mg/dL (ref 6–20)
CALCIUM: 8.6 mg/dL — AB (ref 8.9–10.3)
CO2: 27 mmol/L (ref 22–32)
CREATININE: 0.79 mg/dL (ref 0.44–1.00)
Chloride: 111 mmol/L (ref 101–111)
Glucose, Bld: 86 mg/dL (ref 65–99)
Potassium: 3.2 mmol/L — ABNORMAL LOW (ref 3.5–5.1)
SODIUM: 146 mmol/L — AB (ref 135–145)

## 2014-11-18 LAB — CBC
HCT: 34.8 % — ABNORMAL LOW (ref 36.0–46.0)
Hemoglobin: 11.2 g/dL — ABNORMAL LOW (ref 12.0–15.0)
MCH: 30.8 pg (ref 26.0–34.0)
MCHC: 32.2 g/dL (ref 30.0–36.0)
MCV: 95.6 fL (ref 78.0–100.0)
PLATELETS: 125 10*3/uL — AB (ref 150–400)
RBC: 3.64 MIL/uL — AB (ref 3.87–5.11)
RDW: 16.2 % — ABNORMAL HIGH (ref 11.5–15.5)
WBC: 7.1 10*3/uL (ref 4.0–10.5)

## 2014-11-18 LAB — MAGNESIUM: MAGNESIUM: 1.6 mg/dL — AB (ref 1.7–2.4)

## 2014-11-18 MED ORDER — POTASSIUM CHLORIDE IN NACL 40-0.9 MEQ/L-% IV SOLN: INTRAVENOUS | Status: DC

## 2014-11-18 MED ORDER — POTASSIUM CHLORIDE 10 MEQ/100ML IV SOLN
10.0000 meq | INTRAVENOUS | Status: AC
  Administered 2014-11-18 (×3): 10 meq via INTRAVENOUS
  Filled 2014-11-18 (×3): qty 100

## 2014-11-18 MED ORDER — KCL IN DEXTROSE-NACL 40-5-0.45 MEQ/L-%-% IV SOLN
INTRAVENOUS | Status: AC
  Administered 2014-11-18 – 2014-11-22 (×6): via INTRAVENOUS
  Filled 2014-11-18 (×13): qty 1000

## 2014-11-18 MED ORDER — ONDANSETRON HCL 4 MG/2ML IJ SOLN
4.0000 mg | Freq: Four times a day (QID) | INTRAMUSCULAR | Status: DC
  Administered 2014-11-18: 4 mg via INTRAVENOUS
  Filled 2014-11-18: qty 2

## 2014-11-18 MED ORDER — PANTOPRAZOLE SODIUM 40 MG IV SOLR
40.0000 mg | Freq: Every day | INTRAVENOUS | Status: DC
  Administered 2014-11-18 – 2014-11-25 (×8): 40 mg via INTRAVENOUS
  Filled 2014-11-18 (×8): qty 40

## 2014-11-18 MED ORDER — ONDANSETRON HCL 4 MG/2ML IJ SOLN
4.0000 mg | Freq: Four times a day (QID) | INTRAMUSCULAR | Status: DC | PRN
  Administered 2014-11-19 – 2014-11-25 (×6): 4 mg via INTRAVENOUS
  Filled 2014-11-18 (×5): qty 2

## 2014-11-18 MED ORDER — MAGNESIUM SULFATE 2 GM/50ML IV SOLN
2.0000 g | Freq: Once | INTRAVENOUS | Status: AC
  Administered 2014-11-18: 2 g via INTRAVENOUS
  Filled 2014-11-18: qty 50

## 2014-11-18 MED ORDER — DIATRIZOATE MEGLUMINE & SODIUM 66-10 % PO SOLN
90.0000 mL | Freq: Once | ORAL | Status: AC
  Administered 2014-11-18: 90 mL via NASOGASTRIC
  Filled 2014-11-18: qty 90

## 2014-11-18 NOTE — Progress Notes (Signed)
  Echocardiogram 2D Echocardiogram has been performed.  Darlina Sicilian M 11/18/2014, 4:14 PM

## 2014-11-18 NOTE — Progress Notes (Addendum)
Patient ID: Leslie Frey, female   DOB: 11/18/1932, 81 y.o.   MRN: [id number] [facility] PROGRESS NOTE  Azariah Latendresse RWE:[id number] DOB: 01-22-1933 DOA: 11/15/2014 PCP: No primary care provider on file. Patient follows in [place]  Brief narrative:    81 y.o. female with past medical history of hypertension, dyslipidemia, CAD who presented to [facility] ED with persistent nausea and vomiting associated with epigastric pain. She was subsequently found to have small bowel obstruction. She has been seen by surgery in consultation who recommended initially conservative management. As of 11/18/2014, patient has not yet had a bowel movement. She still has a significant output from NG tube. Most likely she will require surgery for small bowel obstruction.  Anticipated discharge: Most likely will need surgery for small bowel obstruction. Cardiology consulted for preoperative clearance. Home once GI issues resolved.  Assessment/Plan:    Principal Problem:   SBO (small bowel obstruction) secondary to adhesions / Nausea and vomiting / Epigastric pain - Patient found to have small bowel obstruction on abdominal x-ray. CT abdomen confirmed small bowel obstruction thought to be secondary to adhesions. - Surgery has seen the patient in consultation and has initially recommended conservative management with NG tube for decompression. - Patient continues to have significant output from nasogastric tube. She has had no bowel movement in past 48 hours. - Repeat abdominal x-ray today 11/18/2014 shows persistent small bowel obstruction with dilatation of the small bowel to 5.2 cm. - As off 11/18/2014 surgery plans Gastrografin study. They also think she will most likely require surgery for small bowel obstruction. There requested preoperative clearance for surgery. Cardiology will see the patient in consultation. - Continue IV fluids, antiemetics and analgesia as needed for symptom control.  Active Problems:   HTN  (hypertension), essential  - Continue metoprolol 2.5 mg IV every 6 hours in addition to his hydralazine 5 mg every 6 hours as needed for blood pressure above 150/90. - At home, patient takes carvedilol and lisinopril.    Hypokalemia / Hypomagnesemia  - Secondary to GI losses and nasogastric output. - Continue to supplement potassium. Will get magnesium sulfate 2 gm IV today. - Follow-up BMP tomorrow morning. Follow up Magnesium level in am.    Normocytic anemia - Hemoglobin within normal limits on the admission and this morning 11.2. No evidence of gross bleed. - Likely dilutional from IV fluids. - Continue to monitor.    Leukocytosis - Likely reactive, from small bowel obstruction. - No source of acute infection. - White blood cell count spontaneously resolved     Thrombocytopenia - Mild, unclear etiology. Platelets are stable at 125. - No reports of bleeding.    CAD (coronary artery disease), native coronary artery - Stable.  - Aspirin was held because patient is nothing by mouth. - Cardiology consulted for preoperative clearance in case the surgery required.    Dyslipidemia - We will resume statin therapy once patient tolerates by mouth intake.   DVT Prophylaxis  - SCD's bilaterally    Code Status: Full.  Family Communication:  plan of care discussed with the patient and her daughter at the bedside. Disposition Plan: May need surgery for small bowel obstruction. Home once her GI issues resolved.  IV access:  Peripheral IV  Procedures and diagnostic studies:    Dg Chest 2 View 11/15/2014   Hypoinflation without acute cardiopulmonary disease.   Electronically Signed   By: Marin Olp M.D.   On: 11/15/2014 15:54   Dg Abd 1 View 11/15/2014   Prominently  dilated small bowel loops throughout the abdomen. Paucity of colonic gas. Findings are most suggestive of a distal small bowel obstruction. Consider further evaluation with CT of the abdomen and pelvis with oral and  intravenous contrast.  These results were called by telephone at the time of interpretation on 11/15/2014 at 4:47 pm to Dr. Theotis Burrow , who verbally acknowledged these results.   Electronically Signed   By: Ilona Sorrel M.D.   On: 11/15/2014 16:47   Ct Abdomen Pelvis W Contrast 11/15/2014   Mid to distal small bowel obstruction likely due to adhesions over the mid abdomen just anterior to the aortic bifurcation. There is dilatation of the small bowel proximal to the more proximal area of stricturing. There is a second area of small bowel stricturing with a dilated loop caught between these 2 areas of stricturing likely a closed loop component. No pneumatosis or free air.  Subcentimeter liver hypodensity too small to characterize but likely a cyst. Couple subcentimeter right renal cortical hypodensities too small to characterize but likely cysts.  Postsurgical changes as described.  Moderate size hiatal hernia.  These results were called by telephone at the time of interpretation on 11/15/2014 at 7:05 pm to Dr. Theotis Burrow , who verbally acknowledged these results.   Electronically Signed   By: Marin Olp M.D.   On: 11/15/2014 19:05    Medical Consultants:  Surgery Cardiology   Other Consultants:  None   IAnti-Infectives:   None    Leisa Lenz, MD  [facility] Pager [phone]  Time spent in minutes: 25 minutes  If 7PM-7AM, please contact night-coverage [email or web address] Password [facility] 11/18/2014, 11:25 AM   LOS: 3 days    HPI/Subjective: No acute overnight events. Patient reports pain is 5 out of 10 this morning. No vomiting.  Objective: Filed Vitals:   11/17/14 0612 11/17/14 1459 11/17/14 2143 11/18/14 0518  BP: 113/53 129/70 128/48 126/73  Pulse: 65 116 64 65  Temp: 98.5 F (36.9 C) 97.7 F (36.5 C) 98.6 F (37 C) 97.6 F (36.4 C)  TempSrc: Oral Axillary Oral Oral  Resp: 18 20 18 20   Height:      Weight:      SpO2: 100% 100% 98% 100%    Intake/Output Summary  (Last 24 hours) at 11/18/14 1125 Last data filed at 11/18/14 0606  Gross per 24 hour  Intake   2658 ml  Output   2050 ml  Net    608 ml    Exam:   General:  Pt is alert, awake, no distress  Cardiovascular: Slightly tachycardic, S1-S2 appreciated  Respiratory: No wheezing or rhonchi, clear to auscultation bilaterally  Abdomen: Nasogastric tube in place, tenderness in mid abdomen, sluggish bowel sounds  Extremities: No lower extremity edema, appreciate bilateral pulses  Neuro: Nonfocal  Data Reviewed: Basic Metabolic Panel:  Recent Labs Lab 11/15/14 1504 11/17/14 0905 11/18/14 0510  NA 138 141 146*  K 3.6 3.1* 3.2*  CL 99* 104 111  CO2 28 30 27   GLUCOSE 159* 107* 86  BUN 19 16 15   CREATININE 0.96 0.88 0.79  CALCIUM 10.0 8.7* 8.6*  MG  --   --  1.6*   Liver Function Tests:  Recent Labs Lab 11/15/14 1504  AST 31  ALT 17  ALKPHOS 68  BILITOT 0.6  PROT 7.8  ALBUMIN 4.8    Recent Labs Lab 11/15/14 1504  LIPASE 27   No results for input(s): AMMONIA in the last 168 hours. CBC:  Recent Labs  Lab 11/15/14 1504 11/17/14 0905 11/18/14 0510  WBC 11.9* 10.0 7.1  HGB 14.3 12.5 11.2*  HCT 42.5 37.9 34.8*  MCV 94.0 93.8 95.6  PLT 126* 133* 125*   Cardiac Enzymes: No results for input(s): CKTOTAL, CKMB, CKMBINDEX, TROPONINI in the last 168 hours. BNP: Invalid input(s): POCBNP CBG: No results for input(s): GLUCAP in the last 168 hours.  Recent Results (from the past 240 hour(s))  Culture, Urine     Status: None   Collection Time: 11/16/14 11:57 AM  Result Value Ref Range Status   Specimen Description URINE, RANDOM  Final   Special Requests NONE  Final   Culture   Final    MULTIPLE SPECIES PRESENT, SUGGEST RECOLLECTION Performed at [facility]    Report Status 11/17/2014 FINAL  Final     Scheduled Meds: . carvedilol  3.125 mg Oral BID  . gabapentin  300 mg Oral BID  . lisinopril  5 mg Oral Daily  . traZODone  50 mg Oral QHS    Continuous Infusions: . dextrose 5 % and 0.45 % NaCl with KCl 40 mEq/L 100 mL/hr at 11/18/14 1013

## 2014-11-18 NOTE — Progress Notes (Signed)
[facility] Progress Note     Subjective: Pt doesn't have any pain, mild distension, no flatus or BM.  Hasn't ambulated, but wants to.  No N/V.  Doesn't really want surgery today, she wants to wait.  Objective: Vital signs in last 24 hours: Temp:  [97.6 F (36.4 C)-98.6 F (37 C)] 97.6 F (36.4 C) (09/22 0518) Pulse Rate:  [64-116] 65 (09/22 0518) Resp:  [18-20] 20 (09/22 0518) BP: (126-129)/(48-73) 126/73 mmHg (09/22 0518) SpO2:  [98 %-100 %] 100 % (09/22 0518) Last BM Date: 11/12/14  Intake/Output from previous day: 09/21 0701 - 09/22 0700 In: 2658 [I.V.:2658] Out: 2450 [Urine:250; Emesis/NG output:2200] Intake/Output this shift:    PE: Gen:  Alert, NAD, pleasant Abd: Soft, mild distension, NT, diminished BS, no HSM, abdominal scars noted   Lab Results:   Recent Labs  11/17/14 0905 11/18/14 0510  WBC 10.0 7.1  HGB 12.5 11.2*  HCT 37.9 34.8*  PLT 133* 125*   BMET  Recent Labs  11/17/14 0905 11/18/14 0510  NA 141 146*  K 3.1* 3.2*  CL 104 111  CO2 30 27  GLUCOSE 107* 86  BUN 16 15  CREATININE 0.88 0.79  CALCIUM 8.7* 8.6*   PT/INR  Recent Labs  11/17/14 0905  LABPROT 14.1  INR 1.07   CMP     Component Value Date/Time   NA 146* 11/18/2014 0510   K 3.2* 11/18/2014 0510   CL 111 11/18/2014 0510   CO2 27 11/18/2014 0510   GLUCOSE 86 11/18/2014 0510   BUN 15 11/18/2014 0510   CREATININE 0.79 11/18/2014 0510   CALCIUM 8.6* 11/18/2014 0510   PROT 7.8 11/15/2014 1504   ALBUMIN 4.8 11/15/2014 1504   AST 31 11/15/2014 1504   ALT 17 11/15/2014 1504   ALKPHOS 68 11/15/2014 1504   BILITOT 0.6 11/15/2014 1504   GFRNONAA >60 11/18/2014 0510   GFRAA >60 11/18/2014 0510   Lipase     Component Value Date/Time   LIPASE 27 11/15/2014 1504       Studies/Results: Dg Abd 1 View  11/17/2014   CLINICAL DATA:  Small bowel obstruction. Multiple abdominal surgeries. Constipation.  EXAM: ABDOMEN - 1 VIEW  COMPARISON:  11/16/2014   FINDINGS: Dilated loops of small bowel for cyst, measuring up to 4.7 cm in diameter. Nasogastric tube noted, tip in the stomach body. The number of gas-filled small loops appears slightly less than on yesterday' s exam, with caliber of the visualized loops similar. No secondary signs free intraperitoneal gas.  IMPRESSION: 1. There is a reduced number of visible dilated air-filled loops of small bowel, but dilated loops are still present and of similar caliber to the prior exam. Abnormal appearance could reflect obstruction or ileus.   Electronically Signed   By: Van Clines M.D.   On: 11/17/2014 09:10   Dg Abd 1 View  11/16/2014   CLINICAL DATA:  Small bowel obstruction.  EXAM: ABDOMEN - 1 VIEW  COMPARISON:  11/15/2014  FINDINGS: A new nasogastric tube is seen with tip in the proximal stomach. Multiple dilated small bowel loops again seen, with mild decrease in degree of dilatation. No colonic gas visualized. Contrast seen within the urinary bladder from recent CT.  IMPRESSION: Nasogastric tube in appropriate position. Small bowel obstruction, with mild decrease in degree of dilatation since prior exam.   Electronically Signed   By: Earle Gell M.D.   On: 11/16/2014 12:21    Anti-infectives: Anti-infectives    None  Assessment/Plan SBO with h/o 3 abdominal surgeries -Still has high NG output 2239mL/24hr, no flatus or BM -Likely needs surgical intervention, but the patient is hesitant to be operated on.  Will let her discuss with Dr. Kieth Brightly -Continue NPO, NG tube, IVF, pain control, antiemetics -Encouraged ambulating and IS -SCD's and she's not currently on heparin or lovenox, if not going to surgery could start one of these per primary team -Initiate SB protocol with gastrografin  Hx Hypertension Hx of CAD with hx of MI IBS Chronic constipation Chronic abdominal pain Hiatal hernia/GERD    LOS: 3 days    Nat Christen 11/18/2014, 7:21 AM Pager: [phone]

## 2014-11-18 NOTE — Consult Note (Signed)
CARDIOLOGY CONSULT NOTE   Patient ID: Leslie Frey MRN: [id number], DOB/AGE: April 27, 1932   Admit date: 11/15/2014 Date of Consult: 11/18/2014 Reason for  Consult: Pre-Operative Clearance   Primary Physician: No primary care provider on file. Primary Cardiologist: Dr. Derenda Mis ([place], [place]) - New to [place]  HPI  Leslie Frey is a 81 y.o. female with past medical history of non-obstructive CAD, HTN, HLD, and GERD who presented to [facility] ED on 11/15/2014 for abdominal pain and vomiting. She also reported having constipation for the last few weeks.   Upon arrival she was mildly hypertensive and her EKG showed ventricular bigeminy.  KUB showed dilated small bowel loops suggestive of a small bowel obstruction. CT Imaging further confirmed her small bowel obstruction and dilatation of the small bowel with a second area of small bowel stricturing with a dilated loop caught between these 2 areas of stricturing likely a closed loop component.  She was admitted and made NPO with a NG tube placed. She continues to have good output with her NG tube (2238mL in the past 24 hours). However, X-ray shows her SBO is still present and her small bowel is dilated to 5.2cm on 11/18/2014. She has been hesitant to undergo surgery thus far and has favored conservative management but will likely require surgery.  No cardiology records were readily available for review. Several attempts had been made via fax to obtain records from her primary cardiologist in [place]. Upon calling the office, I was informed their fax machine was broken and was transferred directly to Dr. Derenda Mis, her primary cardiologist. He reviewed her most recent studies and said she had a nuclear stress test in 11/2013 which showed no evidence of ischemia and her EF was 55% at that time. She also had an echocardiogram in 06/2013 which showed an EF of 55% and moderate mitral regurgitation and tricuspid regurgitation. He reports she had  catheterizations in the past (last one in 2007) which showed no obstructive CAD. She has never had a stent placed or underwent balloon angioplasty that he is aware of.   She remains without chest pain, shortness of breath, radiating pain down her extremities, or palpitations today. She mostly voices abdominal pain and having occasional nausea.     Problem List  Past Medical History  Diagnosis Date  . Hiatal hernia   . Hypertension   . Heart disease   . Hypercholesterolemia   . Arthritis   . IBS (irritable bowel syndrome)   . GERD (gastroesophageal reflux disease)     Past Surgical History  Procedure Laterality Date  . Appendectomy  1973  . Cholecystectomy  1973  . Abdominal hysterectomy  1986  . Cardiac catheterization    . Cataract extraction  2006  . Eye surgery    . Meniscus tear  2009  . Ulnar nerve repair Left 2013     Allergies No Known Allergies    Inpatient Medications . antiseptic oral rinse  7 mL Mouth Rinse q12n4p  . brimonidine  1 drop Both Eyes BID   And  . timolol  1 drop Both Eyes BID  . chlorhexidine  15 mL Mouth Rinse BID  . gabapentin  300 mg Oral BID  . latanoprost  1 drop Both Eyes QHS  . lubiprostone  24 mcg Oral BID WC  . magnesium sulfate 1 - 4 g bolus IVPB  2 g Intravenous Once  . metoprolol  2.5 mg Intravenous 4 times per day  . pantoprazole (PROTONIX) IV  40  mg Intravenous Daily  . potassium chloride  10 mEq Intravenous Q1 Hr x 3  . traZODone  50 mg Oral QHS    Family History Family History  Problem Relation Age of Onset  . Diabetes Mother   . Diabetes Sister   . Hypertension Mother   . Hypertension Brother      Social History Social History   Social History  . Marital Status: Widowed    Spouse Name: N/A  . Number of Children: N/A  . Years of Education: N/A   Occupational History  . Not on file.   Social History Main Topics  . Smoking status: Former Smoker -- 1.00 packs/day for 2 years    Types: Cigarettes    Quit  date: 10/27/1989  . Smokeless tobacco: Not on file  . Alcohol Use: 0.0 oz/week    0 Standard drinks or equivalent per week     Comment: Occasional - 1 glass of wine per week  . Drug Use: No  . Sexual Activity: Not on file   Other Topics Concern  . Not on file   Social History Narrative     Review of Systems General:  No chills, fever, night sweats or weight changes.  Cardiovascular:  No chest pain, dyspnea on exertion, edema, orthopnea, palpitations, paroxysmal nocturnal dyspnea. Dermatological: No rash, lesions/masses Respiratory: No cough, dyspnea Urologic: No hematuria, dysuria Abdominal:   No diarrhea, bright red blood per rectum, melena, or hematemesis. Positive for nausea and constipation. Neurologic:  No visual changes, wkns, changes in mental status. All other systems reviewed and are otherwise negative except as noted above.  Physical Exam  Blood pressure 120/71, pulse 66, temperature 97.6 F (36.4 C), temperature source Oral, resp. rate 20, height 5' (1.524 m), weight 138 lb (62.596 kg), SpO2 100 %.  General: Pleasant, Caucasian female in NAD Psych: Normal affect. Neuro: Alert and oriented X 3. Moves all extremities spontaneously. HEENT: Normal. NG tube in place.  Neck: Supple without bruits or JVD. Lungs:  Resp regular and unlabored, CTA without wheezing or rales. Heart: RRR no s3, s4, or murmurs. Abdomen: Soft, non-tender, BS + x 4.  Extremities: No clubbing, cyanosis or edema. DP/PT/Radials 2+ and equal bilaterally.  Labs  Lab Results  Component Value Date   WBC 7.1 11/18/2014   HGB 11.2* 11/18/2014   HCT 34.8* 11/18/2014   MCV 95.6 11/18/2014   PLT 125* 11/18/2014    Recent Labs Lab 11/15/14 1504  11/18/14 0510  NA 138  < > 146*  K 3.6  < > 3.2*  CL 99*  < > 111  CO2 28  < > 27  BUN 19  < > 15  CREATININE 0.96  < > 0.79  CALCIUM 10.0  < > 8.6*  PROT 7.8  --   --   BILITOT 0.6  --   --   ALKPHOS 68  --   --   ALT 17  --   --   AST 31  --    --   GLUCOSE 159*  < > 86  < > = values in this interval not displayed. No results found for: CHOL, HDL, LDLCALC, TRIG No results found for: DDIMER  Radiology/Studies Dg Abd 1 View: 11/17/2014   CLINICAL DATA:  Small bowel obstruction. Multiple abdominal surgeries. Constipation.  EXAM: ABDOMEN - 1 VIEW  COMPARISON:  11/16/2014  FINDINGS: Dilated loops of small bowel for cyst, measuring up to 4.7 cm in diameter. Nasogastric tube noted, tip in the  stomach body. The number of gas-filled small loops appears slightly less than on yesterday' s exam, with caliber of the visualized loops similar. No secondary signs free intraperitoneal gas.  IMPRESSION: 1. There is a reduced number of visible dilated air-filled loops of small bowel, but dilated loops are still present and of similar caliber to the prior exam. Abnormal appearance could reflect obstruction or ileus.   Electronically Signed   By: Van Clines M.D.   On: 11/17/2014 09:10   Dg Abd 1 View: 11/16/2014   CLINICAL DATA:  Small bowel obstruction.  EXAM: ABDOMEN - 1 VIEW  COMPARISON:  11/15/2014  FINDINGS: A new nasogastric tube is seen with tip in the proximal stomach. Multiple dilated small bowel loops again seen, with mild decrease in degree of dilatation. No colonic gas visualized. Contrast seen within the urinary bladder from recent CT.  IMPRESSION: Nasogastric tube in appropriate position. Small bowel obstruction, with mild decrease in degree of dilatation since prior exam.   Electronically Signed   By: Earle Gell M.D.   On: 11/16/2014 12:21   Dg Abd 1 View: 11/15/2014   CLINICAL DATA:  Vomiting, nausea, epigastric abdominal pain and severe constipation. Reported history of hiatal hernia, appendectomy, hysterectomy and cholecystectomy.  EXAM: ABDOMEN - 1 VIEW  COMPARISON:  None.  FINDINGS: There are prominently dilated small bowel loops throughout the abdomen measuring up to 5.5 cm name in diameter. There is an absence of colonic gas. No evidence  of pneumatosis or pneumoperitoneum. Surgical clips are noted in the right upper quadrant. Curvilinear left upper quadrant calcifications are likely atherosclerotic splenic artery calcifications. Ovoid rim calcified structure overlying the right gluteal soft tissues, likely a gluteal granuloma. Visualized lung bases are clear. Moderate degenerative changes are seen in the visualized thoracolumbar spine.  IMPRESSION: Prominently dilated small bowel loops throughout the abdomen. Paucity of colonic gas. Findings are most suggestive of a distal small bowel obstruction. Consider further evaluation with CT of the abdomen and pelvis with oral and intravenous contrast.  These results were called by telephone at the time of interpretation on 11/15/2014 at 4:47 pm to Dr. Theotis Burrow , who verbally acknowledged these results.   Electronically Signed   By: Ilona Sorrel M.D.   On: 11/15/2014 16:47   Ct Abdomen Pelvis W Contrast: 11/15/2014   CLINICAL DATA:  Nausea and vomiting with last bowel movement 11/12/2014. Previous cholecystectomy and appendectomy as well as hysterectomy.  EXAM: CT ABDOMEN AND PELVIS WITH CONTRAST  TECHNIQUE: Multidetector CT imaging of the abdomen and pelvis was performed using the standard protocol following bolus administration of intravenous contrast.  CONTRAST:  140mL OMNIPAQUE IOHEXOL 300 MG/ML  SOLN  COMPARISON:  None.  FINDINGS: Lung bases are within normal. Minimal calcification of the mitral valve annulus. Moderate size hiatal hernia is present.  Abdominal images demonstrate surgical absence of the gallbladder. There is a subcentimeter hypodensity over the left lobe of the liver too small to characterize but likely a cyst. The spleen, pancreas and adrenal glands are normal.  Kidneys are normal in size without hydronephrosis or nephrolithiasis. There is a subcentimeter hypodensity over the lower pole and upper pole cortex of the right kidney likely cysts but too small to characterize.  There are  multiple dilated fluid in air-filled small bowel loops measuring up to 4.7 cm in diameter compatible with a small bowel obstruction. Site of obstruction is due to two areas of small bowel stricturing over the mid abdomen just anterior to the aortic bifurcation likely  due to adhesions. There is obstruction of small bowel proximal to the more proximal stricturing as well as dilatation of a short segment of small bowel between the 2 areas of stricturing likely a closed-loop component. No evidence of pneumatosis or free peritoneal air. No evidence of free fluid. Small bowel distal to these areas of stricturing is decompressed. Colon is decompressed.  Pelvic images demonstrate the bladder and rectum to be within normal. Surgical absence of the uterus. Tiny amount of free fluid in the pelvis.  There mild degenerate changes of the spine and hips.  IMPRESSION: Mid to distal small bowel obstruction likely due to adhesions over the mid abdomen just anterior to the aortic bifurcation. There is dilatation of the small bowel proximal to the more proximal area of stricturing. There is a second area of small bowel stricturing with a dilated loop caught between these 2 areas of stricturing likely a closed loop component. No pneumatosis or free air.  Subcentimeter liver hypodensity too small to characterize but likely a cyst. Couple subcentimeter right renal cortical hypodensities too small to characterize but likely cysts.  Postsurgical changes as described.  Moderate size hiatal hernia.  These results were called by telephone at the time of interpretation on 11/15/2014 at 7:05 pm to Dr. Theotis Burrow , who verbally acknowledged these results.   Electronically Signed   By: Marin Olp M.D.   On: 11/15/2014 19:05   Dg Abd Portable 1v-small Bowel Obstruction Protocol-initial, 8 Hr Delay: 11/18/2014   CLINICAL DATA:  Abdominal pain and distension, small bowel obstruction on recent CT and plain film examination.  EXAM: PORTABLE  ABDOMEN - 1 VIEW  COMPARISON:  11/17/2014  FINDINGS: Nasogastric catheter remains in the stomach. Scattered large and small bowel gas is noted. Small bowel is dilated to 5.2 cm. The overall appearance is similar to that seen on the prior exam. This likely represents a partial small bowel obstruction given the degree of colonic gas.  IMPRESSION: Persistent small bowel obstruction with dilatation of the small bowel to 5.2 cm.   Electronically Signed   By: Inez Catalina M.D.   On: 11/18/2014 10:09    ECG: 11/15/2014: Sinus Tachycardia with rate at 114. Ventricular Bigeminy.  ASSESSMENT AND PLAN  1. Pre-operative Cardiac Clearance for Small Bowel Obstruction with history of Coronary Artery Disease  - Several fax messages were sent for medical records to her primary Cardiologist over the past day and were never returned. Called the office today (Dr. Derenda Mis - [phone]) and spoke to him personally for review of her records. She has history of non-obstructive CAD by cath but never required intervention. Last echo was in 06/2013 showing EF of 55% with moderate MR and TR. Nuclear Stress Test in 11/2013 was low-risk and showed no evidence of ischemia. EF 55% at that time.  - obtaining new echocardiogram - patient is of acceptable risk stratification for the procedure.  2. HTN - Metoprolol 2.5mg  IV Q6H and Hydralazine 5mg  Q6H PRN while NPO.  3. HLD - resume statin once no longer NPO  4. SBO - per surgery team   Signed, Erma Heritage, PA-C 11/18/2014, 12:13 PM Pager: [phone]  Patient examined chart reviewed Discussed history and care with patient and daughter CAD history seems benign and not active No chest pain Not clear if she ever had intervention Cath 2012 diagnostic with "no blockages"  Normal myovue 2015 and normal EF by echo 06/2013.   ECG/Telemetry with PVC;s bigeminny Continue beta blocker. Currently with NG tube  in place And not wanting surgery.  Ok to proceed from cardiac  perspective if conservative approach fails  Jenkins Rouge

## 2014-11-19 ENCOUNTER — Encounter (HOSPITAL_COMMUNITY)
Admission: EM | Disposition: A | Discharge status: Home / Self Care | Payer: Self-pay | Source: Home / Self Care | Attending: Internal Medicine

## 2014-11-19 ENCOUNTER — Encounter (HOSPITAL_COMMUNITY): Payer: Self-pay | Admitting: Anesthesiology

## 2014-11-19 DIAGNOSIS — I2510 Atherosclerotic heart disease of native coronary artery without angina pectoris: Secondary | ICD-10-CM

## 2014-11-19 LAB — BASIC METABOLIC PANEL
Anion gap: 6 (ref 5–15)
BUN: 13 mg/dL (ref 6–20)
CALCIUM: 8.5 mg/dL — AB (ref 8.9–10.3)
CO2: 29 mmol/L (ref 22–32)
CREATININE: 0.83 mg/dL (ref 0.44–1.00)
Chloride: 109 mmol/L (ref 101–111)
Glucose, Bld: 133 mg/dL — ABNORMAL HIGH (ref 65–99)
Potassium: 3.8 mmol/L (ref 3.5–5.1)
SODIUM: 144 mmol/L (ref 135–145)

## 2014-11-19 LAB — CBC
HEMATOCRIT: 35.9 % — AB (ref 36.0–46.0)
HEMOGLOBIN: 12 g/dL (ref 12.0–15.0)
MCH: 31.7 pg (ref 26.0–34.0)
MCHC: 33.4 g/dL (ref 30.0–36.0)
MCV: 94.7 fL (ref 78.0–100.0)
Platelets: 125 10*3/uL — ABNORMAL LOW (ref 150–400)
RBC: 3.79 MIL/uL — AB (ref 3.87–5.11)
RDW: 15.9 % — AB (ref 11.5–15.5)
WBC: 8.3 10*3/uL (ref 4.0–10.5)

## 2014-11-19 LAB — MAGNESIUM: Magnesium: 2.1 mg/dL (ref 1.7–2.4)

## 2014-11-19 SURGERY — LAPAROSCOPY, DIAGNOSTIC
Anesthesia: General

## 2014-11-19 MED ORDER — PROMETHAZINE HCL 25 MG/ML IJ SOLN
12.5000 mg | Freq: Once | INTRAMUSCULAR | Status: AC
  Administered 2014-11-19: 12.5 mg via INTRAVENOUS
  Filled 2014-11-19: qty 1

## 2014-11-19 MED ORDER — LORAZEPAM 2 MG/ML IJ SOLN
0.5000 mg | Freq: Once | INTRAMUSCULAR | Status: AC
  Administered 2014-11-19: 1 mg via INTRAVENOUS
  Filled 2014-11-19: qty 1

## 2014-11-19 MED ORDER — KETOROLAC TROMETHAMINE 30 MG/ML IJ SOLN
30.0000 mg | Freq: Four times a day (QID) | INTRAMUSCULAR | Status: DC | PRN
  Administered 2014-11-19 – 2014-11-21 (×2): 30 mg via INTRAVENOUS
  Filled 2014-11-19 (×2): qty 1

## 2014-11-19 NOTE — Progress Notes (Signed)
Patient had 25 ml's residual, after clamping.  Patient also had large loose bowel movement.  Will Corunna PA notified, will start on sips of clears.

## 2014-11-19 NOTE — Progress Notes (Signed)
  Progress Note: General Surgery Service   Subjective: +headache, no gas or bm overnight. Min abdominal pain  Objective: Vital signs in last 24 hours: Temp:  [97 F (36.1 C)-99.1 F (37.3 C)] 98.2 F (36.8 C) (09/23 0822) Pulse Rate:  [42-72] 45 (09/23 0822) Resp:  [16-18] 16 (09/23 0822) BP: (109-142)/(57-71) 116/66 mmHg (09/23 0822) SpO2:  [93 %-99 %] 94 % (09/23 0822) Last BM Date: 11/12/14  Intake/Output from previous day: 09/22 0701 - 09/23 0700 In: 400 [NG/GT:400] Out: 1650 [Emesis/NG output:1650] Intake/Output this shift:    Lungs: CTAB  Cardiovascular: bradycardic  Abd: soft, slight distension, NT  Extremities: no edema  Neuro: AOx4  Lab Results: CBC   Recent Labs  11/18/14 0510 11/19/14 0540  WBC 7.1 8.3  HGB 11.2* 12.0  HCT 34.8* 35.9*  PLT 125* 125*   BMET  Recent Labs  11/18/14 0510 11/19/14 0540  NA 146* 144  K 3.2* 3.8  CL 111 109  CO2 27 29  GLUCOSE 86 133*  BUN 15 13  CREATININE 0.79 0.83  CALCIUM 8.6* 8.5*   PT/INR  Recent Labs  11/17/14 0905  LABPROT 14.1  INR 1.07   ABG No results for input(s): PHART, HCO3 in the last 72 hours.  Invalid input(s): PCO2, PO2  Studies/Results:  Anti-infectives: Anti-infectives    None      Medications: Scheduled Meds: . antiseptic oral rinse  7 mL Mouth Rinse q12n4p  . brimonidine  1 drop Both Eyes BID   And  . timolol  1 drop Both Eyes BID  . chlorhexidine  15 mL Mouth Rinse BID  . gabapentin  300 mg Oral BID  . latanoprost  1 drop Both Eyes QHS  . lubiprostone  24 mcg Oral BID WC  . metoprolol  2.5 mg Intravenous 4 times per day  . pantoprazole (PROTONIX) IV  40 mg Intravenous Daily  . traZODone  50 mg Oral QHS   Continuous Infusions: . dextrose 5 % and 0.45 % NaCl with KCl 40 mEq/L 100 mL/hr at 11/19/14 0812   PRN Meds:.hydrALAZINE, HYDROmorphone (DILAUDID) injection, hydrOXYzine, menthol-cetylpyridinium, ondansetron (ZOFRAN) IV, phenol  Assessment/Plan: Patient  Active Problem List   Diagnosis Date Noted  . Hypomagnesemia 11/18/2014  . Normocytic anemia 11/18/2014  . Hypokalemia 11/17/2014  . Leukocytosis 11/17/2014  . Thrombocytopenia 11/17/2014  . Nausea and vomiting 11/16/2014  . Dyslipidemia 11/16/2014  . SBO (small bowel obstruction) 11/15/2014  . HTN (hypertension) 11/15/2014  . CAD (coronary artery disease), native coronary artery 11/15/2014   SBO: contrast has reached her colon so this appears to be pSBO, still high NG output overnight -Clamp NGT -clears today   LOS: 4 days   Mickeal Skinner, MD Pg# [phone] [facility]

## 2014-11-19 NOTE — Progress Notes (Signed)
Patient Name: Leslie Frey Date of Encounter: 11/19/2014  Principal Problem:   SBO (small bowel obstruction) Active Problems:   HTN (hypertension)   CAD (coronary artery disease), native coronary artery   Nausea and vomiting   Dyslipidemia   Hypokalemia   Leukocytosis   Thrombocytopenia   Hypomagnesemia   Normocytic anemia   Primary Cardiologist: Dr Marko Stai in [facility], now lives in [place] w/ dtr>>Dr Johnsie Cancel  Patient Profile: 81 yo female w/ non-obs dz by cath 2007, non-isch nuclear stress 2015, nl EF by echo 2015, HTN, HL, OA. Admitted 09/19 w/ SBO. Cards saw pre-op.  SUBJECTIVE: Thoroughly miserable, GI issues and also has HA.   OBJECTIVE Filed Vitals:   11/18/14 1723 11/18/14 2211 11/19/14 0534 11/19/14 0822  BP: 133/61 109/57 142/57 116/66  Pulse: 66 69 42 45  Temp:  98.4 F (36.9 C) 99.1 F (37.3 C) 98.2 F (36.8 C)  TempSrc:  Oral Oral Oral  Resp:  18  16  Height:      Weight:      SpO2:  93% 96% 94%    Intake/Output Summary (Last 24 hours) at 11/19/14 6599 Last data filed at 11/19/14 0500  Gross per 24 hour  Intake    400 ml  Output   1650 ml  Net  -1250 ml   Filed Weights   11/16/14 1348  Weight: 138 lb (62.596 kg)    PHYSICAL EXAM General: Well developed, well nourished, female in no acute distress. Head: Normocephalic, atraumatic.  Neck: Supple without bruits, JVD not elevated. Lungs:  Resp regular and unlabored, decreased BS bases. Heart: Slightly irreg, S1, S2, no S3, S4, or murmur; no rub. Abdomen: distended, tender, quiet Extremities: No clubbing, cyanosis, edema.  Neuro: Alert and oriented X 3. Moves all extremities spontaneously.  LABS: CBC: Recent Labs  11/18/14 0510 11/19/14 0540  WBC 7.1 8.3  HGB 11.2* 12.0  HCT 34.8* 35.9*  MCV 95.6 94.7  PLT 125* 125*   INR: Recent Labs  11/17/14 0905  INR 3.57   Basic Metabolic Panel: Recent Labs  11/18/14 0510 11/19/14 0540  NA 146* 144  K 3.2* 3.8  CL 111 109  CO2 27 29    GLUCOSE 86 133*  BUN 15 13  CREATININE 0.79 0.83  CALCIUM 8.6* 8.5*  MG 1.6* 2.1   TELE:  Not on  ECHO: 11/18/2014 - Left ventricle: The cavity size was normal. Wall thickness was increased in a pattern of mild LVH. Systolic function was normal. The estimated ejection fraction was in the range of 55% to 60%. Hypokinesis of the inferolateral myocardium. - Mitral valve: Moderately calcified annulus. There was moderate regurgitation. - Left atrium: The atrium was mildly to moderately dilated. - Right ventricle: The cavity size was mildly dilated. Wall thickness was normal. - Right atrium: The atrium was mildly to moderately dilated. - Tricuspid valve: There was moderate regurgitation. - Pulmonary arteries: Systolic pressure was moderately increased. PA peak pressure: 49 mm Hg (S).  Radiology/Studies: Dg Abd Portable 1v 11/18/2014   CLINICAL DATA:  Small bowel obstruction protocol. Patient was given oral contrast at 10:30 a.m.  EXAM: PORTABLE ABDOMEN - 1 VIEW  COMPARISON:  11/18/2014 at 9:25 a.m.  FINDINGS: Contrast as extended into the colon. There is small bowel dilation similar to the earlier study.  Nasogastric tube tip projects in the mid stomach.  IMPRESSION: 1. Contrast as extended into the colon.  Colon is nondistended. 2. There is persistent small bowel dilation similar to the  earlier study. Findings are consistent with a partial small bowel obstruction.   Electronically Signed   By: Lajean Manes M.D.   On: 11/18/2014 18:59   Dg Abd Portable 1v-small Bowel Obstruction Protocol-initial, 8 Hr Delay 11/18/2014   CLINICAL DATA:  Abdominal pain and distension, small bowel obstruction on recent CT and plain film examination.  EXAM: PORTABLE ABDOMEN - 1 VIEW  COMPARISON:  11/17/2014  FINDINGS: Nasogastric catheter remains in the stomach. Scattered large and small bowel gas is noted. Small bowel is dilated to 5.2 cm. The overall appearance is similar to that seen on the prior  exam. This likely represents a partial small bowel obstruction given the degree of colonic gas.  IMPRESSION: Persistent small bowel obstruction with dilatation of the small bowel to 5.2 cm.   Electronically Signed   By: Inez Catalina M.D.   On: 11/18/2014 10:09   Current Medications:  . antiseptic oral rinse  7 mL Mouth Rinse q12n4p  . brimonidine  1 drop Both Eyes BID   And  . timolol  1 drop Both Eyes BID  . chlorhexidine  15 mL Mouth Rinse BID  . gabapentin  300 mg Oral BID  . latanoprost  1 drop Both Eyes QHS  . lubiprostone  24 mcg Oral BID WC  . metoprolol  2.5 mg Intravenous 4 times per day  . pantoprazole (PROTONIX) IV  40 mg Intravenous Daily  . traZODone  50 mg Oral QHS   . dextrose 5 % and 0.45 % NaCl with KCl 40 mEq/L 100 mL/hr at 11/19/14 6948    ASSESSMENT AND PLAN:   CAD (coronary artery disease), native coronary artery - non-obstructive by remote cath - non-ischemic nuclear stress 2015 - EF normal w/ no WMA by echo 09/22, results above - at acceptable risk for surgery, would put on telemetry 48 hr after surgery, if performed.  Principal Problem:   SBO (small bowel obstruction) - per [facility] - had 1650 cc out NG tube last 24 hours  Otherwise, per IM/[facility] Active Problems:   HTN (hypertension)   Nausea and vomiting   Dyslipidemia   Hypokalemia   Leukocytosis   Thrombocytopenia   Hypomagnesemia   Normocytic anemia   Signed, Rosaria Ferries , PA-C 9:39 AM 11/19/2014

## 2014-11-19 NOTE — Progress Notes (Addendum)
Patient ID: Leslie Frey, female   DOB: [birth year], 81 y.o.   MRN: [id number] [facility] PROGRESS NOTE  Alishah Schulte MVE:[id number] DOB: [birth year] DOA: 11/15/2014 PCP: No primary care provider on file. Patient follows in [place]  Brief narrative:    81 y.o. female with past medical history of hypertension, dyslipidemia, CAD who presented to [facility] ED with persistent nausea and vomiting associated with epigastric pain. She was subsequently found to have small bowel obstruction. She has been seen by surgery in consultation who recommended conservative management. Based on gastrografin study 9/22 contrast  has reached colon so now recommendations is to clamp NGT and start clears as of 9/23.  Anticipated discharge: home once SBO resolve.    Assessment/Plan:    Principal Problem:   SBO (small bowel obstruction) secondary to adhesions / Nausea and vomiting / Epigastric pain - Adominal x-ray and CT abdomen showed small bowel obstruction secondary to adhesions. - Pt being seen by surgery in consultation.  - Initial recommendation was for conservative management with NG tube, IV fluids, analgesia and antiemetics as needed - Pt still with significant NGT output. - Gastrografin done 9/22 and contrast has reached the colon so for now per surgery clamp NGT and advance diet to clears.  - Last abdominal x ray 9/22 demonstrated contrast extended into the colon, persistent small bowel dilation similar to the earlier study. Findings consistent with a partial small bowel. - As always, I appreciate surgery input   Active Problems:   HTN (hypertension), essential  - BP meds on hold due to hypotension     Hypokalemia / Hypomagnesemia  - Secondary to GI losses and nasogastric output. - We are supplementing electrolytes - Follow up BMP and magnesium level in am    Normocytic anemia - Hemoglobin within normal limits on the admission and then drop to 11.2 noted 9/22 which is likey dilutional from IV fludis  received so far. - No evidence of bleeding. - Hemoglobin 12 this morning.    Leukocytosis - Likely reactive, from small bowel obstruction. - Spontaneously resolved.    Thrombocytopenia - Mild, unclear etiology. - No bleeding.    CAD (coronary artery disease), native coronary artery - Stable.  - Aspirin held on that admission because patient is nothing by mouth from small bowel obstruction. - Appreciate cardiology input.    Dyslipidemia - Resume statin therapy once patient tolerates by mouth intake.   DVT Prophylaxis  - SCD's bilaterally in hospital    Code Status: Full.  Family Communication:  plan of care discussed with the patient and her daughter at the bedside. Disposition Plan: Clears today.  IV access:  Peripheral IV  Procedures and diagnostic studies:    Dg Abd Portable 1v-small Bowel Obstruction Protocol-initial, 8 Hr Delay 11/18/2014  Persistent small bowel obstruction with dilatation of the small bowel to 5.2 cm.     Dg Abd Portable 1v 11/18/2014  1. Contrast as extended into the colon.  Colon is nondistended. 2. There is persistent small bowel dilation similar to the earlier study. Findings are consistent with a partial small bowel obstruction.      Dg Abd 1 View 11/17/2014   1. There is a reduced number of visible dilated air-filled loops of small bowel, but dilated loops are still present and of similar caliber to the prior exam. Abnormal appearance could reflect obstruction or ileus.     Dg Abd 1 View 11/16/2014  Nasogastric tube in appropriate position. Small bowel obstruction, with mild decrease in degree of dilatation  since prior exam.     Dg Chest 2 View 11/15/2014   Hypoinflation without acute cardiopulmonary disease.     Dg Abd 1 View 11/15/2014   Prominently dilated small bowel loops throughout the abdomen. Paucity of colonic gas. Findings are most suggestive of a distal small bowel obstruction. Consider further evaluation with CT of the abdomen and pelvis  with oral and intravenous contrast.  These results were called by telephone at the time of interpretation on 11/15/2014 at 4:47 pm to Dr. Theotis Burrow , who verbally acknowledged these results.   Electronically Signed   By: Ilona Sorrel M.D.   On: 11/15/2014 16:47   Ct Abdomen Pelvis W Contrast 11/15/2014   Mid to distal small bowel obstruction likely due to adhesions over the mid abdomen just anterior to the aortic bifurcation. There is dilatation of the small bowel proximal to the more proximal area of stricturing. There is a second area of small bowel stricturing with a dilated loop caught between these 2 areas of stricturing likely a closed loop component. No pneumatosis or free air.  Subcentimeter liver hypodensity too small to characterize but likely a cyst. Couple subcentimeter right renal cortical hypodensities too small to characterize but likely cysts.  Postsurgical changes as described.  Moderate size hiatal hernia.      Medical Consultants:  Surgery Cardiology   Other Consultants:  None   IAnti-Infectives:   None    Leslie Lenz, MD  [facility] Pager [phone]  Time spent in minutes: 25 minutes  If 7PM-7AM, please contact night-coverage [email or web address] Password [email or web address] 11/19/2014, 11:53 AM   LOS: 4 days    HPI/Subjective: No acute overnight events. Patient reports sore throat.   Objective: Filed Vitals:   11/19/14 0822 11/19/14 1100 11/19/14 1127 11/19/14 1130  BP: 116/66  86/53 88/62  Pulse: 45  60 72  Temp: 98.2 F (36.8 C)  97.7 F (36.5 C) 97.7 F (36.5 C)  TempSrc: Oral  Oral Oral  Resp: 16  16 16   Height:  5' (1.524 m)    Weight:  74.1 kg (163 lb 5.8 oz)    SpO2: 94%  96% 96%    Intake/Output Summary (Last 24 hours) at 11/19/14 1153 Last data filed at 11/19/14 0500  Gross per 24 hour  Intake    400 ml  Output   1650 ml  Net  -1250 ml    Exam:   General:  Pt is alert, no acute distress  Cardiovascular: now bradycardic, (+) S1, S2    Respiratory: clear to auscultation bilaterally, no wheezing   Abdomen: has NGT, (+ ) BS, non tender abd  Extremities: No leg swelling, palpable pulses   Neuro: No focal deficits.   Data Reviewed: Basic Metabolic Panel:  Recent Labs Lab 11/15/14 1504 11/17/14 0905 11/18/14 0510 11/19/14 0540  NA 138 141 146* 144  K 3.6 3.1* 3.2* 3.8  CL 99* 104 111 109  CO2 28 30 27 29   GLUCOSE 159* 107* 86 133*  BUN 19 16 15 13   CREATININE 0.96 0.88 0.79 0.83  CALCIUM 10.0 8.7* 8.6* 8.5*  MG  --   --  1.6* 2.1   Liver Function Tests:  Recent Labs Lab 11/15/14 1504  AST 31  ALT 17  ALKPHOS 68  BILITOT 0.6  PROT 7.8  ALBUMIN 4.8    Recent Labs Lab 11/15/14 1504  LIPASE 27   No results for input(s): AMMONIA in the last 168 hours. CBC:  Recent Labs Lab 11/15/14 1504  11/17/14 0905 11/18/14 0510 11/19/14 0540  WBC 11.9* 10.0 7.1 8.3  HGB 14.3 12.5 11.2* 12.0  HCT 42.5 37.9 34.8* 35.9*  MCV 94.0 93.8 95.6 94.7  PLT 126* 133* 125* 125*   Cardiac Enzymes: No results for input(s): CKTOTAL, CKMB, CKMBINDEX, TROPONINI in the last 168 hours. BNP: Invalid input(s): POCBNP CBG: No results for input(s): GLUCAP in the last 168 hours.  Recent Results (from the past 240 hour(s))  Culture, Urine     Status: None   Collection Time: 11/16/14 11:57 AM  Result Value Ref Range Status   Specimen Description URINE, RANDOM  Final   Special Requests NONE  Final   Culture   Final    MULTIPLE SPECIES PRESENT, SUGGEST RECOLLECTION Performed at [facility]    Report Status 11/17/2014 FINAL  Final     . gabapentin  300 mg Oral BID  . pantoprazole (PROTONIX) IV  40 mg Intravenous Daily  . traZODone  50 mg Oral QHS     Continuous Infusions: . dextrose 5 % and 0.45 % NaCl with KCl 40 mEq/L 100 mL/hr at 11/19/14 [phone]

## 2014-11-19 NOTE — Anesthesia Preprocedure Evaluation (Deleted)
Anesthesia Evaluation  Patient identified by MRN, date of birth, ID band Patient awake    Reviewed: Allergy & Precautions, H&P , Patient's Chart, lab work & pertinent test results, reviewed documented beta blocker date and time   Airway Mallampati: II  TM Distance: >3 FB Neck ROM: full    Dental no notable dental hx.    Pulmonary former smoker,    Pulmonary exam normal breath sounds clear to auscultation       Cardiovascular hypertension,  Rhythm:regular Rate:Normal     Neuro/Psych    GI/Hepatic hiatal hernia, GERD  ,  Endo/Other    Renal/GU      Musculoskeletal   Abdominal   Peds  Hematology   Anesthesia Other Findings Partial Bowel Obstruction Plts 125k Hiatal hernia    Hypertension     Heart disease..... LVH, ectopy, LAE    Hypercholesterolemia     Arthritis    IBS (irritable bowel syndrome)       GERD (gastroesophageal reflux     Reproductive/Obstetrics                             Anesthesia Physical Anesthesia Plan  ASA: II  Anesthesia Plan: General   Post-op Pain Management:    Induction: Intravenous  Airway Management Planned: Oral ETT and Video Laryngoscope Planned  Additional Equipment:   Intra-op Plan:   Post-operative Plan: Extubation in OR  Informed Consent: I have reviewed the patients History and Physical, chart, labs and discussed the procedure including the risks, benefits and alternatives for the proposed anesthesia with the patient or authorized representative who has indicated his/her understanding and acceptance.   Dental Advisory Given and Dental advisory given  Plan Discussed with: CRNA and Surgeon  Anesthesia Plan Comments: (  Discussed general anesthesia, including possible nausea, instrumentation of airway, sore throat,pulmonary aspiration, etc. I asked if the were any outstanding questions, or  concerns before we proceeded. )         Anesthesia Quick Evaluation

## 2014-11-20 ENCOUNTER — Inpatient Hospital Stay (HOSPITAL_COMMUNITY): Payer: Medicare Other

## 2014-11-20 MED ORDER — METOPROLOL TARTRATE 1 MG/ML IV SOLN
2.5000 mg | Freq: Two times a day (BID) | INTRAVENOUS | Status: DC
  Administered 2014-11-20 – 2014-11-22 (×6): 2.5 mg via INTRAVENOUS
  Filled 2014-11-20 (×9): qty 5

## 2014-11-20 NOTE — Progress Notes (Signed)
Consult note and cardiac preop note reviewed from Dr Johnsie Cancel 11/18/14. No new recs at this time, please call over weekend if cardiology is needed.   Zandra Abts MD

## 2014-11-20 NOTE — Progress Notes (Addendum)
Patient ID: Leslie Frey, female   DOB: 09-04-1932, 81 y.o.   MRN: [id number] [facility] PROGRESS NOTE  Leslie Frey JJK:[id number] DOB: [birth year] DOA: 11/15/2014 PCP: No primary care Nuri Branca on file. Patient follows in [place]  Brief narrative:    81 y.o. female with past medical history of hypertension, dyslipidemia, CAD who presented to [facility] ED with persistent nausea and vomiting associated with epigastric pain. She was subsequently found to have small bowel obstruction. She has been seen by surgery in consultation who recommended conservative management.  Based on gastrografin study 9/22 contrast  has reached colon. Diet advanced on 9/23 but she continues to have significant output from NG tube so NG reconnected again overnight 9/23-/24.  Anticipated discharge: on clear but still NG tube output, reconnected again overnight.    Assessment/Plan:    Principal Problem:   SBO (small bowel obstruction) secondary to adhesions / Nausea and vomiting / Epigastric pain - Pt found to have SBO on admission. Her adominal x-ray and CT abdomen demonstarted small bowel obstruction secondary to adhesions. - Per surgery, initial recommendation was for conservative management with NG tube, IV fluids, analgesia and antiemetics as needed. - She had gastrografin study and it was apparent contrast reached colon so her diet was advanced to clears 9/23. NG tube clamped 9/23. - Overnight, more output so NG tube reconnected.  - Follow up on surgery recommendations.   Active Problems:   HTN (hypertension), essential  - BP meds held 9/23 due to hypotension but this am BP 140/91 so we will resume low dose metoprolol 2.5 mg IV Q 12 hours today    Hypokalemia / Hypomagnesemia  - Secondary to GI losses and nasogastric tube output - Supplemented.  - Both magnesium and potassium WNL this am    Normocytic anemia - Slight drop in hemoglobin noted 9/22 from normal to 11.2 which was thought to be dilutional from IV  fluids received for hydration - Hemoglobin normalized    Leukocytosis - Likely reactive, from small bowel obstruction. - Resolved.    Thrombocytopenia - Mild, unclear etiology. - Using SCD's bilaterally     CAD (coronary artery disease), native coronary artery - Stable.  - ASA on hold since pt is not on full solid food  - Has been seen by cardio in consultation for clearance for potential surgery     Dyslipidemia - We will resume statin therapy once patient tolerates by mouth intake.   DVT Prophylaxis  - SCD's bilaterally    Code Status: Full.  Family Communication:  plan of care discussed with the patient; updated her daughter daily  Disposition Plan: will follow up with surgery if any plans for surgery   IV access:  Peripheral IV  Procedures and diagnostic studies:    Dg Abd Portable 1v-small Bowel Obstruction Protocol-initial, 8 Hr Delay 11/18/2014  Persistent small bowel obstruction with dilatation of the small bowel to 5.2 cm.     Dg Abd Portable 1v 11/18/2014  1. Contrast as extended into the colon.  Colon is nondistended. 2. There is persistent small bowel dilation similar to the earlier study. Findings are consistent with a partial small bowel obstruction.      Dg Abd 1 View 11/17/2014   1. There is a reduced number of visible dilated air-filled loops of small bowel, but dilated loops are still present and of similar caliber to the prior exam. Abnormal appearance could reflect obstruction or ileus.     Dg Abd 1 View 11/16/2014  Nasogastric tube in  appropriate position. Small bowel obstruction, with mild decrease in degree of dilatation since prior exam.     Dg Chest 2 View 11/15/2014   Hypoinflation without acute cardiopulmonary disease.     Dg Abd 1 View 11/15/2014   Prominently dilated small bowel loops throughout the abdomen. Paucity of colonic gas. Findings are most suggestive of a distal small bowel obstruction. Consider further evaluation with CT of the abdomen and  pelvis with oral and intravenous contrast.  These results were called by telephone at the time of interpretation on 11/15/2014 at 4:47 pm to Dr. Theotis Burrow , who verbally acknowledged these results.   Electronically Signed   By: Ilona Sorrel M.D.   On: 11/15/2014 16:47   Ct Abdomen Pelvis W Contrast 11/15/2014   Mid to distal small bowel obstruction likely due to adhesions over the mid abdomen just anterior to the aortic bifurcation. There is dilatation of the small bowel proximal to the more proximal area of stricturing. There is a second area of small bowel stricturing with a dilated loop caught between these 2 areas of stricturing likely a closed loop component. No pneumatosis or free air.  Subcentimeter liver hypodensity too small to characterize but likely a cyst. Couple subcentimeter right renal cortical hypodensities too small to characterize but likely cysts.  Postsurgical changes as described.  Moderate size hiatal hernia.      Medical Consultants:  Surgery Cardiology   Other Consultants:  None   IAnti-Infectives:   None    Leisa Lenz, MD  [facility] Pager [phone]  Time spent in minutes: 25 minutes  If 7PM-7AM, please contact night-coverage [email or web address] Password [facility] 11/20/2014, 8:46 AM   LOS: 5 days    HPI/Subjective: No acute overnight events. Patient reports sore throat better.   Objective: Filed Vitals:   11/19/14 1130 11/19/14 1357 11/19/14 2158 11/20/14 0554  BP: 88/62 104/62 139/51 140/91  Pulse: 72 64 47 84  Temp: 97.7 F (36.5 C) 98.3 F (36.8 C) 98.8 F (37.1 C) 97.8 F (36.6 C)  TempSrc: Oral Oral Oral Axillary  Resp: 16 18 20 18   Height:      Weight:      SpO2: 96% 97% 99% 98%    Intake/Output Summary (Last 24 hours) at 11/20/14 0846 Last data filed at 11/20/14 0600  Gross per 24 hour  Intake   2500 ml  Output    600 ml  Net   1900 ml    Exam:   General:  Pt is sleeping, wakes up easily, no distress  Cardiovascular: rate  controlled, appreciate S1, S2   Respiratory: bilateral air entry, no wheezing   Abdomen: NG tube in place, (+) BS, non tender but firm abdomen   Extremities: No LE edema, appreciate bilateral pulses   Neuro: Nonfocal  Data Reviewed: Basic Metabolic Panel:  Recent Labs Lab 11/15/14 1504 11/17/14 0905 11/18/14 0510 11/19/14 0540  NA 138 141 146* 144  K 3.6 3.1* 3.2* 3.8  CL 99* 104 111 109  CO2 28 30 27 29   GLUCOSE 159* 107* 86 133*  BUN 19 16 15 13   CREATININE 0.96 0.88 0.79 0.83  CALCIUM 10.0 8.7* 8.6* 8.5*  MG  --   --  1.6* 2.1   Liver Function Tests:  Recent Labs Lab 11/15/14 1504  AST 31  ALT 17  ALKPHOS 68  BILITOT 0.6  PROT 7.8  ALBUMIN 4.8    Recent Labs Lab 11/15/14 1504  LIPASE 27   No results for input(s):  AMMONIA in the last 168 hours. CBC:  Recent Labs Lab 11/15/14 1504 11/17/14 0905 11/18/14 0510 11/19/14 0540  WBC 11.9* 10.0 7.1 8.3  HGB 14.3 12.5 11.2* 12.0  HCT 42.5 37.9 34.8* 35.9*  MCV 94.0 93.8 95.6 94.7  PLT 126* 133* 125* 125*   Cardiac Enzymes: No results for input(s): CKTOTAL, CKMB, CKMBINDEX, TROPONINI in the last 168 hours. BNP: Invalid input(s): POCBNP CBG: No results for input(s): GLUCAP in the last 168 hours.  Recent Results (from the past 240 hour(s))  Culture, Urine     Status: None   Collection Time: 11/16/14 11:57 AM  Result Value Ref Range Status   Specimen Description URINE, RANDOM  Final   Special Requests NONE  Final   Culture   Final    MULTIPLE SPECIES PRESENT, SUGGEST RECOLLECTION Performed at [facility]    Report Status 11/17/2014 FINAL  Final     . antiseptic oral rinse  7 mL Mouth Rinse q12n4p  . brimonidine  1 drop Both Eyes BID   And  . timolol  1 drop Both Eyes BID  . chlorhexidine  15 mL Mouth Rinse BID  . gabapentin  300 mg Oral BID  . latanoprost  1 drop Both Eyes QHS  . lubiprostone  24 mcg Oral BID WC  . pantoprazole (PROTONIX) IV  40 mg Intravenous Daily  . traZODone   50 mg Oral QHS    Continuous Infusions: . dextrose 5 % and 0.45 % NaCl with KCl 40 mEq/L 100 mL/hr at 11/19/14 1903

## 2014-11-20 NOTE — Progress Notes (Signed)
Patient ID: Leslie Frey, female   DOB: 1932-05-08, 81 y.o.   MRN: [id number]  [facility]  HD#: 6  Subjective: Patient in bed.  Denies pain.  NG reconnected to suction last night.  No flatus or BM per patient.  Objective: Vital signs in last 24 hours: Temp:  [97.7 F (36.5 C)-98.8 F (37.1 C)] 97.8 F (36.6 C) (09/24 0554) Pulse Rate:  [47-84] 84 (09/24 0554) Resp:  [16-20] 18 (09/24 0554) BP: (86-140)/(51-91) 140/91 mmHg (09/24 0554) SpO2:  [96 %-99 %] 98 % (09/24 0554) Last BM Date: 11/12/14  Intake/Output from previous day: 09/23 0701 - 09/24 0700 In: 2500 [I.V.:2500] Out: 600 [Emesis/NG output:600] Intake/Output this shift:    Physical Exam: HEENT - sclerae clear, mucous membranes moist Neck - soft Chest - clear bilaterally Cor - RRR Abdomen - soft, moderate distension, quiet; non-tender; NG with bilious output Ext - no edema, non-tender Neuro - alert & oriented, no focal deficits  Lab Results:   Recent Labs  11/18/14 0510 11/19/14 0540  WBC 7.1 8.3  HGB 11.2* 12.0  HCT 34.8* 35.9*  PLT 125* 125*   BMET  Recent Labs  11/18/14 0510 11/19/14 0540  NA 146* 144  K 3.2* 3.8  CL 111 109  CO2 27 29  GLUCOSE 86 133*  BUN 15 13  CREATININE 0.79 0.83  CALCIUM 8.6* 8.5*   PT/INR No results for input(s): LABPROT, INR in the last 72 hours. Comprehensive Metabolic Panel:    Component Value Date/Time   NA 144 11/19/2014 0540   NA 146* 11/18/2014 0510   K 3.8 11/19/2014 0540   K 3.2* 11/18/2014 0510   CL 109 11/19/2014 0540   CL 111 11/18/2014 0510   CO2 29 11/19/2014 0540   CO2 27 11/18/2014 0510   BUN 13 11/19/2014 0540   BUN 15 11/18/2014 0510   CREATININE 0.83 11/19/2014 0540   CREATININE 0.79 11/18/2014 0510   GLUCOSE 133* 11/19/2014 0540   GLUCOSE 86 11/18/2014 0510   CALCIUM 8.5* 11/19/2014 0540   CALCIUM 8.6* 11/18/2014 0510   AST 31 11/15/2014 1504   ALT 17 11/15/2014 1504   ALKPHOS 68 11/15/2014  1504   BILITOT 0.6 11/15/2014 1504   PROT 7.8 11/15/2014 1504   ALBUMIN 4.8 11/15/2014 1504    Studies/Results: Dg Abd Portable 1v  11/18/2014   CLINICAL DATA:  Small bowel obstruction protocol. Patient was given oral contrast at 10:30 a.m.  EXAM: PORTABLE ABDOMEN - 1 VIEW  COMPARISON:  11/18/2014 at 9:25 a.m.  FINDINGS: Contrast as extended into the colon. There is small bowel dilation similar to the earlier study.  Nasogastric tube tip projects in the mid stomach.  IMPRESSION: 1. Contrast as extended into the colon.  Colon is nondistended. 2. There is persistent small bowel dilation similar to the earlier study. Findings are consistent with a partial small bowel obstruction.   Electronically Signed   By: Lajean Manes M.D.   On: 11/18/2014 18:59    Anti-infectives: Anti-infectives    None      Assessment & Plans: Partial small bowel obstruction likely secondary to adhesions from prior surgical procedures  NG to suction, NPO  IV hydration  Will check AXR today to evaluate degree of obstruction  Encouraged OOB, ambulation, IS use  If no improvement, may require laparotomy for lysis of adhesions  Earnstine Regal, MD, [facility] Office: [place] 11/20/2014

## 2014-11-20 NOTE — Progress Notes (Signed)
Not tolerating clamped ng.  Nausea/vomiting noted after po intake, residuals approx 100 cc bilious  At 2100.  Ng to LIS at 2100

## 2014-11-21 ENCOUNTER — Inpatient Hospital Stay (HOSPITAL_COMMUNITY): Payer: Medicare Other

## 2014-11-21 DIAGNOSIS — I10 Essential (primary) hypertension: Secondary | ICD-10-CM | POA: Diagnosis present

## 2014-11-21 LAB — BASIC METABOLIC PANEL
Anion gap: 3 — ABNORMAL LOW (ref 5–15)
BUN: 5 mg/dL — AB (ref 6–20)
CHLORIDE: 107 mmol/L (ref 101–111)
CO2: 28 mmol/L (ref 22–32)
CREATININE: 0.85 mg/dL (ref 0.44–1.00)
Calcium: 8.5 mg/dL — ABNORMAL LOW (ref 8.9–10.3)
GFR calc Af Amer: >60 mL/min (ref >60)
GFR calc non Af Amer: >60 mL/min (ref >60)
GLUCOSE: 134 mg/dL — AB (ref 65–99)
POTASSIUM: 4.4 mmol/L (ref 3.5–5.1)
SODIUM: 138 mmol/L (ref 135–145)

## 2014-11-21 LAB — CBC
HCT: 34.3 % — ABNORMAL LOW (ref 36.0–46.0)
Hemoglobin: 11.3 g/dL — ABNORMAL LOW (ref 12.0–15.0)
MCH: 30.6 pg (ref 26.0–34.0)
MCHC: 32.9 g/dL (ref 30.0–36.0)
MCV: 93 fL (ref 78.0–100.0)
PLATELETS: 140 10*3/uL — AB (ref 150–400)
RBC: 3.69 MIL/uL — AB (ref 3.87–5.11)
RDW: 15.8 % — ABNORMAL HIGH (ref 11.5–15.5)
WBC: 10.4 10*3/uL (ref 4.0–10.5)

## 2014-11-21 NOTE — Progress Notes (Signed)
Patient ID: Leslie Frey, female   DOB: 1933/01/19, 81 y.o.   MRN: [id number]  [facility]  HD#: [id number]    Subjective: Patient awake and alert; no complaints.  Reports two liquid BM's last night.  Objective: Vital signs in last 24 hours: Temp:  [98.9 F (37.2 C)-99.4 F (37.4 C)] 98.9 F (37.2 C) Dec 11, 2022 0518) Pulse Rate:  [72-93] 72 Dec 11, 2022 0518) Resp:  [18-19] 18 2022-12-11 0518) BP: (130-141)/(77-83) 140/77 mmHg Dec 11, 2022 0518) SpO2:  [97 %-99 %] 99 % 12-11-22 0518) Last BM Date: 11/12/14  Intake/Output from previous day: 09/24 0701 - Dec 11, 2022 0700 In: 1200 [I.V.:1200] Out: 1650 [Urine:1150; Emesis/NG output:500] Intake/Output this shift:    Physical Exam: HEENT - sclerae clear, mucous membranes moist Neck - soft Chest - clear bilaterally Cor - RRR Abdomen - soft without distension; BS present; moderate bilious output from NG tube overnight Ext - no edema, non-tender Neuro - alert & oriented, no focal deficits  Lab Results:   Recent Labs  11/19/14 0540  WBC 8.3  HGB 12.0  HCT 35.9*  PLT 125*   BMET  Recent Labs  11/19/14 0540  NA 144  K 3.8  CL 109  CO2 29  GLUCOSE 133*  BUN 13  CREATININE 0.83  CALCIUM 8.5*   PT/INR No results for input(s): LABPROT, INR in the last 72 hours. Comprehensive Metabolic Panel:    Component Value Date/Time   NA 144 11/19/2014 0540   NA 146* 11/18/2014 0510   K 3.8 11/19/2014 0540   K 3.2* 11/18/2014 0510   CL 109 11/19/2014 0540   CL 111 11/18/2014 0510   CO2 29 11/19/2014 0540   CO2 27 11/18/2014 0510   BUN 13 11/19/2014 0540   BUN 15 11/18/2014 0510   CREATININE 0.83 11/19/2014 0540   CREATININE 0.79 11/18/2014 0510   GLUCOSE 133* 11/19/2014 0540   GLUCOSE 86 11/18/2014 0510   CALCIUM 8.5* 11/19/2014 0540   CALCIUM 8.6* 11/18/2014 0510   AST 31 11/15/2014 1504   ALT 17 11/15/2014 1504   ALKPHOS 68 11/15/2014 1504   BILITOT 0.6 11/15/2014 1504   PROT 7.8 11/15/2014 1504   ALBUMIN 4.8  11/15/2014 1504    Studies/Results: Dg Abd 2 Views  12/11/14   CLINICAL DATA:  Abdominal distention this morning. Follow-up small bowel obstruction.  EXAM: ABDOMEN - 2 VIEW  COMPARISON:  11/20/2014  FINDINGS: Degree of small bowel distention decreased since prior study. Mildly prominent sigmoid colon loops in the lower abdomen and upper pelvis. No free air. No organomegaly. Oral contrast material noted within the right colon and transverse colon.  IMPRESSION: Improving small bowel distention. Mild gaseous distention of the colon currently.   Electronically Signed   By: Rolm Baptise M.D.   On: 12/11/2014 08:46   Dg Abd 2 Views  11/20/2014   CLINICAL DATA:  Small bowel obstruction.  Upper abdominal pain.  EXAM: ABDOMEN - 2 VIEW  COMPARISON:  11/18/2014  FINDINGS: No intraperitoneal free air is identified. Enteric tube terminates over the expected region of the gastric body. Residual oral contrast material is again seen in the colon. There is persistent prominent dilatation of multiple small bowel loops measuring up to 6-6.5 cm in diameter, stable to slightly increased from the prior study. Upper abdominal surgical clips are noted.  IMPRESSION: Stable to slightly increased small bowel dilatation consistent with ongoing obstruction.   Electronically Signed   By: Logan Bores M.D.   On: 11/20/2014 12:13  Anti-infectives: Anti-infectives    None      Assessment & Plans: Partial small bowel obstruction likely secondary to adhesions from prior surgical procedures NG to suction, NPO - allow ice chips today IV hydration AXR this AM - much improved with contrast and gas in colon Encouraged OOB, ambulation, IS use  Earnstine Regal, MD, [facility] Office: [place] 11/21/2014

## 2014-11-21 NOTE — Progress Notes (Addendum)
Patient ID: Leslie Frey, female   DOB: 06-14-1932, 81 y.o.   MRN: [id number] [facility] PROGRESS NOTE  Leslie Frey OZD:[id number] DOB: 1932-05-23 DOA: 11/15/2014 PCP: No primary care provider on file. Patient follows in [place]  Brief narrative:    81 y.o. female with past medical history of hypertension, dyslipidemia, CAD who presented to [facility] ED with persistent nausea and vomiting associated with epigastric pain. She was subsequently found to have small bowel obstruction. She has been seen by surgery in consultation.  Initially we thought she many need surgery for which reason we consulted cardiology for preoperative clearance. Patient then had gastrografin study on 9/22 and it was noted that contrast has reached colon. At that point surgery has advanced the diet to clear liquids 9/23 and clamped NG tube but in less than 24 hours she had to have NG tube reconnected.  Patient continues to have NG tube output although improving in past 48 hours. Her abdominal X ray from 9/25 demonstrates improving small bowel obstruction.  Anticipated discharge: once SBO resolves. Due to continuous NG tube output surgery recommended NPO and ice chips for today.    Assessment/Plan:    Principal Problem:   SBO (small bowel obstruction) secondary to adhesions / Nausea and vomiting / Epigastric pain - Patient was admitted with nausea, vomiting and epigastric pain. She was found to have SBO as seen on CT abdomen and abdominal x ray on admission. - She was seen by surgery in consultation and they have recommended conservative management with NG tube for decompression, IV fluids and analgesia for pain and antiemetics as needed for nausea and/or vomiting - It was thought she may need surgery but based on gastrografin study 9/22 it was noted that the contrast has reached the colon and at this point surgery actually placed on hold. - She continues to have NG tube output although it is improving at least in past 24-48  hours. - Her most recent abdominal x ray 11/21/2014 demonstrates improving small bowel obstruction. - Surgery recommends NPO and to allow ice chips, encourage ambulation  - We will also continue IV fluids for hydration until she resumes PO intake    Active Problems:   HTN (hypertension), essential  - Blood pressure this am is 140/77 - She is on metoprolol 2.5 mg IV every 12 hours     Hypokalemia / Hypomagnesemia  - Secondary to GI losses and nasogastric tube output - Supplemented.  - Both magnesium and potassium WNL    Normocytic anemia - Slight drop in hemoglobin noted 9/22 from normal to 11.2, likely dilutional from IV fluids received for hydration - Hemoglobin stable 11.3 - No current indications for transfusion     Leukocytosis - Likely reactive, from small bowel obstruction. - Resolved spontaneously.    Thrombocytopenia - Mild, platelets 140  - Using SCD's bilaterally     CAD (coronary artery disease), native coronary artery - Stable. No chest pain. - ASA on hold since patient is still NPO. - Has been seen by cardio in consultation for clearance for potential surgery     Dyslipidemia - Will resume statin therapy once patient tolerates by mouth intake.   DVT Prophylaxis  - SCD's bilaterally in hospital    Code Status: Full.  Family Communication:  plan of care discussed with the patient; daughter not at the bedside Disposition Plan: patient will be ready for discharge once SBO resolves which may take another few days, possibly by 11/24/2014.   IV access:  Peripheral IV  Procedures and diagnostic studies:     Dg Abd 2 Views 11/21/2014   Improving small bowel distention. Mild gaseous distention of the colon currently.   Electronically Signed   By: Rolm Baptise M.D.   On: 11/21/2014 08:46   Dg Abd 2 Views 11/20/2014   Stable to slightly increased small bowel dilatation consistent with ongoing obstruction.   Electronically Signed   By: Logan Bores M.D.   On:  11/20/2014 12:13   Dg Abd Portable 1v-small Bowel Obstruction Protocol-initial, 8 Hr Delay 11/18/2014  Persistent small bowel obstruction with dilatation of the small bowel to 5.2 cm.     Dg Abd Portable 1v 11/18/2014  1. Contrast as extended into the colon.  Colon is nondistended. 2. There is persistent small bowel dilation similar to the earlier study. Findings are consistent with a partial small bowel obstruction.      Dg Abd 1 View 11/17/2014   1. There is a reduced number of visible dilated air-filled loops of small bowel, but dilated loops are still present and of similar caliber to the prior exam. Abnormal appearance could reflect obstruction or ileus.     Dg Abd 1 View 11/16/2014  Nasogastric tube in appropriate position. Small bowel obstruction, with mild decrease in degree of dilatation since prior exam.     Dg Chest 2 View 11/15/2014   Hypoinflation without acute cardiopulmonary disease.     Dg Abd 1 View 11/15/2014   Prominently dilated small bowel loops throughout the abdomen. Paucity of colonic gas. Findings are most suggestive of a distal small bowel obstruction. Consider further evaluation with CT of the abdomen and pelvis with oral and intravenous contrast.  These results were called by telephone at the time of interpretation on 11/15/2014 at 4:47 pm to Dr. Theotis Burrow , who verbally acknowledged these results.   Electronically Signed   By: Ilona Sorrel M.D.   On: 11/15/2014 16:47   Ct Abdomen Pelvis W Contrast 11/15/2014   Mid to distal small bowel obstruction likely due to adhesions over the mid abdomen just anterior to the aortic bifurcation. There is dilatation of the small bowel proximal to the more proximal area of stricturing. There is a second area of small bowel stricturing with a dilated loop caught between these 2 areas of stricturing likely a closed loop component. No pneumatosis or free air.  Subcentimeter liver hypodensity too small to characterize but likely a cyst. Couple  subcentimeter right renal cortical hypodensities too small to characterize but likely cysts.  Postsurgical changes as described.  Moderate size hiatal hernia.      Medical Consultants:  Surgery (Dr. Armandina Gemma) Cardiology   Other Consultants:  None   IAnti-Infectives:   None    Leisa Lenz, MD  [facility] Pager [phone]  Time spent in minutes: 25 minutes  If 7PM-7AM, please contact night-coverage [email or web address] Password [facility] 11/21/2014, 11:25 AM   LOS: 6 days    HPI/Subjective: No acute overnight events. Patient reports feeling better. No vomiting.   Objective: Filed Vitals:   11/20/14 0554 11/20/14 1541 11/20/14 2159 11/21/14 0518  BP: 140/91 141/81 130/83 140/77  Pulse: 84 93 82 72  Temp: 97.8 F (36.6 C) 99.4 F (37.4 C) 99.4 F (37.4 C) 98.9 F (37.2 C)  TempSrc: Axillary Oral Oral Oral  Resp: 18 18 19 18   Height:      Weight:      SpO2: 98% 97% 97% 99%    Intake/Output Summary (Last 24 hours) at 11/21/14  1125 Last data filed at 11/21/14 0406  Gross per 24 hour  Intake   1200 ml  Output   1650 ml  Net   -450 ml    Exam:   General:  Pt is awake, alert, no acute distress   Cardiovascular: appreciate S1, S2, rate controlled  Respiratory: clear to auscultation bilaterally, no wheezing     Abdomen: has NG tube, firm abdomen, non tender, (+) BS  Extremities: no leg swelling, palpable pulses   Neuro: No focal neurologic deficits   Data Reviewed: Basic Metabolic Panel:  Recent Labs Lab 11/15/14 1504 11/17/14 0905 11/18/14 0510 11/19/14 0540  NA 138 141 146* 144  K 3.6 3.1* 3.2* 3.8  CL 99* 104 111 109  CO2 28 30 27 29   GLUCOSE 159* 107* 86 133*  BUN 19 16 15 13   CREATININE 0.96 0.88 0.79 0.83  CALCIUM 10.0 8.7* 8.6* 8.5*  MG  --   --  1.6* 2.1   Liver Function Tests:  Recent Labs Lab 11/15/14 1504  AST 31  ALT 17  ALKPHOS 68  BILITOT 0.6  PROT 7.8  ALBUMIN 4.8    Recent Labs Lab 11/15/14 1504  LIPASE 27    No results for input(s): AMMONIA in the last 168 hours. CBC:  Recent Labs Lab 11/15/14 1504 11/17/14 0905 11/18/14 0510 11/19/14 0540  WBC 11.9* 10.0 7.1 8.3  HGB 14.3 12.5 11.2* 12.0  HCT 42.5 37.9 34.8* 35.9*  MCV 94.0 93.8 95.6 94.7  PLT 126* 133* 125* 125*   Cardiac Enzymes: No results for input(s): CKTOTAL, CKMB, CKMBINDEX, TROPONINI in the last 168 hours. BNP: Invalid input(s): POCBNP CBG: No results for input(s): GLUCAP in the last 168 hours.  Recent Results (from the past 240 hour(s))  Culture, Urine     Status: None   Collection Time: 11/16/14 11:57 AM  Result Value Ref Range Status   Specimen Description URINE, RANDOM  Final   Special Requests NONE  Final   Culture   Final    MULTIPLE SPECIES PRESENT, SUGGEST RECOLLECTION Performed at [facility]    Report Status 11/17/2014 FINAL  Final     . antiseptic oral rinse  7 mL Mouth Rinse q12n4p  . brimonidine  1 drop Both Eyes BID   And  . timolol  1 drop Both Eyes BID  . chlorhexidine  15 mL Mouth Rinse BID  . gabapentin  300 mg Oral BID  . latanoprost  1 drop Both Eyes QHS  . lubiprostone  24 mcg Oral BID WC  . metoprolol  2.5 mg Intravenous Q12H  . pantoprazole (PROTONIX) IV  40 mg Intravenous Daily  . traZODone  50 mg Oral QHS    Continuous Infusions: . dextrose 5 % and 0.45 % NaCl with KCl 40 mEq/L 100 mL/hr at 11/21/14 [phone]

## 2014-11-22 DIAGNOSIS — G43A0 Cyclical vomiting, not intractable: Secondary | ICD-10-CM

## 2014-11-22 LAB — CBC
HCT: 33.1 % — ABNORMAL LOW (ref 36.0–46.0)
Hemoglobin: 11.1 g/dL — ABNORMAL LOW (ref 12.0–15.0)
MCH: 31.3 pg (ref 26.0–34.0)
MCHC: 33.5 g/dL (ref 30.0–36.0)
MCV: 93.2 fL (ref 78.0–100.0)
PLATELETS: 144 10*3/uL — AB (ref 150–400)
RBC: 3.55 MIL/uL — ABNORMAL LOW (ref 3.87–5.11)
RDW: 15.9 % — AB (ref 11.5–15.5)
WBC: 7.1 10*3/uL (ref 4.0–10.5)

## 2014-11-22 LAB — BASIC METABOLIC PANEL
Anion gap: 6 (ref 5–15)
BUN: 7 mg/dL (ref 6–20)
CALCIUM: 8.7 mg/dL — AB (ref 8.9–10.3)
CO2: 27 mmol/L (ref 22–32)
CREATININE: 0.81 mg/dL (ref 0.44–1.00)
Chloride: 106 mmol/L (ref 101–111)
GFR calc Af Amer: >60 mL/min (ref >60)
Glucose, Bld: 115 mg/dL — ABNORMAL HIGH (ref 65–99)
POTASSIUM: 4.2 mmol/L (ref 3.5–5.1)
SODIUM: 139 mmol/L (ref 135–145)

## 2014-11-22 MED ORDER — BISACODYL 10 MG RE SUPP
10.0000 mg | Freq: Two times a day (BID) | RECTAL | Status: DC
  Administered 2014-11-22: 10 mg via RECTAL
  Filled 2014-11-22 (×2): qty 1

## 2014-11-22 NOTE — Care Management Important Message (Signed)
Important Message  Patient Details  Name: Leslie Frey MRN: [id number] Date of Birth: 29-May-1932   Medicare Important Message Given:  Yes-third notification given    Camillo Flaming 11/22/2014, 12:30 [place] Message  Patient Details  Name: Leslie Frey MRN: [id number] Date of Birth: 04-07-1932   Medicare Important Message Given:  Yes-third notification given    Camillo Flaming 11/22/2014, 12:30 PM

## 2014-11-22 NOTE — Progress Notes (Signed)
Nutrition Follow-up  DOCUMENTATION CODES:   Not applicable  INTERVENTION:  - Advancement to CLD as medically feasible - RD will continue to monitor for needs  NUTRITION DIAGNOSIS:   Inadequate oral intake related to inability to eat as evidenced by NPO status. -ongoing  GOAL:   Patient will meet greater than or equal to 90% of their needs -unmet  MONITOR:   Diet advancement, Weight trends, Labs, I & O's  ASSESSMENT:   81 y.o. female with h/o HTN, HLD, hiatal hernia. Patient presents to ED with epigastric pain and vomiting. Vomiting has been non-stop since last evening. Has been having trouble with constipation over last week or 2 and has been many days since last BM. Pain is moderate in intensity, located in epigastric area, non-radiating.  9/26 Diet advanced to CLD on 9/23 with no documented intakes. Diet again NPO as of 9/24 @ 0953. Pt has been NPO since admission other than 9/23 (6 days total).  Pt states that she is feeling nauseated this AM and as if she is going to vomit. She denies abdominal pain. Per surgery note this AM: She likely could have her NGT clamped and try some sips of liquids, but the daughter and nurse state that the patient chokes even with just ice chips with the NGT in place, so this is not safe.  Not meeting needs. Will continue to monitor for plan of care and associated needs. ~100cc tan-colored drainage in canister at time of visit; pt and daughter state that canister was recently changed.   Medications reviewed. Labs reviewed; Ca: 8.7 mg/dL.    9/20 - Pt has been NPO since admission with NGT to suction placed last night.  - Wall canister full at time of RD visit.  - Pt denies abdominal pain after Morphine given recently and denies nausea.  - She states PTA she had abdominal pain x1.5 days with no associated nausea or vomiting.  - Pt's daughter at bedside reports pt began living with her 1 month ago.  - She states pt has never been a big eater  and that she has not noticed any changes in appetite recently.  - Pt denies swallowing difficulty but states she has problems chewing meat and needs to "chew until it does not even taste good."  - No muscle or fat wasting present at this time.  - She states she had ice chips today which she tolerated well; will monitor for ability for diet to be advanced.  Diet Order:  Diet NPO time specified  Skin:  Reviewed, no issues  Last BM:  9/25  Height:   Ht Readings from Last 1 Encounters:  11/16/14 5' (1.524 m)    Weight:   Wt Readings from Last 1 Encounters:  11/16/14 138 lb (62.596 kg)    Ideal Body Weight:  45.45 kg (kg)  BMI:  Body mass index is 26.95 kg/(m^2).  Estimated Nutritional Needs:   Kcal:  1300-1500  Protein:  50-60 grams  Fluid:  2-2.2 L/day  EDUCATION NEEDS:   No education needs identified at this time     Jarome Matin, RD, LDN Inpatient Clinical Dietitian Pager # [phone] After hours/weekend pager # [phone]

## 2014-11-22 NOTE — Progress Notes (Signed)
Patient ID: Leslie Frey, female   DOB: 1932/10/17, 81 y.o.   MRN: [id number] 3 Days Post-Op  Subjective: Pt unsure whether she has passed flatus or not.  Only minimal abdominal discomfort at times.  Up with PT prior to me arriving.  Objective: Vital signs in last 24 hours: Temp:  [97.8 F (36.6 C)-99.1 F (37.3 C)] 98.2 F (36.8 C) (09/26 0623) Pulse Rate:  [66-79] 74 (09/26 0623) Resp:  [18] 18 (09/26 0623) BP: (110-138)/(64-91) 135/74 mmHg (09/26 0623) SpO2:  [97 %-98 %] 97 % (09/26 0623) Last BM Date: 2014/12/03  Intake/Output from previous day: December 03, 2022 0701 - 09/26 0700 In: 1125 [I.V.:1125] Out: 1850 [Urine:650; Emesis/NG output:1200] Intake/Output this shift:    PE: Abd: soft, minimally tender, hypoactive BS, NGT with minimal tan/brown output  Lab Results:   Recent Labs  2014-12-03 1136 11/22/14 0540  WBC 10.4 7.1  HGB 11.3* 11.1*  HCT 34.3* 33.1*  PLT 140* 144*   BMET  Recent Labs  Dec 03, 2014 1136 11/22/14 0540  NA 138 139  K 4.4 4.2  CL 107 106  CO2 28 27  GLUCOSE 134* 115*  BUN 5* 7  CREATININE 0.85 0.81  CALCIUM 8.5* 8.7*   PT/INR No results for input(s): LABPROT, INR in the last 72 hours. CMP     Component Value Date/Time   NA 139 11/22/2014 0540   K 4.2 11/22/2014 0540   CL 106 11/22/2014 0540   CO2 27 11/22/2014 0540   GLUCOSE 115* 11/22/2014 0540   BUN 7 11/22/2014 0540   CREATININE 0.81 11/22/2014 0540   CALCIUM 8.7* 11/22/2014 0540   PROT 7.8 11/15/2014 1504   ALBUMIN 4.8 11/15/2014 1504   AST 31 11/15/2014 1504   ALT 17 11/15/2014 1504   ALKPHOS 68 11/15/2014 1504   BILITOT 0.6 11/15/2014 1504   GFRNONAA >60 11/22/2014 0540   GFRAA >60 11/22/2014 0540   Lipase     Component Value Date/Time   LIPASE 27 11/15/2014 1504       Studies/Results: Dg Abd 2 Views  12-03-14   CLINICAL DATA:  Abdominal distention this morning. Follow-up small bowel obstruction.  EXAM: ABDOMEN - 2 VIEW  COMPARISON:  11/20/2014  FINDINGS: Degree of  small bowel distention decreased since prior study. Mildly prominent sigmoid colon loops in the lower abdomen and upper pelvis. No free air. No organomegaly. Oral contrast material noted within the right colon and transverse colon.  IMPRESSION: Improving small bowel distention. Mild gaseous distention of the colon currently.   Electronically Signed   By: Rolm Baptise M.D.   On: December 03, 2014 08:46   Dg Abd 2 Views  11/20/2014   CLINICAL DATA:  Small bowel obstruction.  Upper abdominal pain.  EXAM: ABDOMEN - 2 VIEW  COMPARISON:  11/18/2014  FINDINGS: No intraperitoneal free air is identified. Enteric tube terminates over the expected region of the gastric body. Residual oral contrast material is again seen in the colon. There is persistent prominent dilatation of multiple small bowel loops measuring up to 6-6.5 cm in diameter, stable to slightly increased from the prior study. Upper abdominal surgical clips are noted.  IMPRESSION: Stable to slightly increased small bowel dilatation consistent with ongoing obstruction.   Electronically Signed   By: Logan Bores M.D.   On: 11/20/2014 12:13    Anti-infectives: Anti-infectives    None       Assessment/Plan  1. pSBO -patient has contrast and air in her colon, but it has appeared this way now for several  days.  Her small dilatation is improved though.  Will give suppositories today to try and help jump start her colonic motility to see if this will help.  She likely could have her NGT clamped and try some sips of liquids, but the daughter and nurse state that the patient chokes even with just ice chips with the NGT in place, so this is not safe. -recheck her films tomorrow after suppositories and see if things have progressed.   LOS: 7 days    Kamrynn Melott E 11/22/2014, 11:46 AM Pager: [phone]

## 2014-11-22 NOTE — Progress Notes (Signed)
Pt appears stable from cardiology standpoint.  Please call if problems develop.  Will plan an outpt appt with Dr. Johnsie Cancel for cardiology.

## 2014-11-22 NOTE — Evaluation (Signed)
Physical Therapy Evaluation Patient Details Name: Leslie Frey MRN: [id number] DOB: 1932/06/22 Today's Date: 11/22/2014   History of Present Illness  81 yo female admitted with SBO. Hx of HTn, hiatal hernia, MI  Clinical Impression  On eval, pt was Min assist for mobility-walked ~150 feet while holding onto IV pole. Pt is generally weak and not feeling well at all. Pt has walked in hallway with daughter. Encouraged them to continue to mobilize according to pt's tolerance. Will follow during hospital stay.     Follow Up Recommendations No PT follow up;Supervision - Intermittent    Equipment Recommendations  None recommended by PT    Recommendations for Other Services       Precautions / Restrictions Precautions Precautions: Fall Precaution Comments: due to general weakness Restrictions Weight Bearing Restrictions: No      Mobility  Bed Mobility               General bed mobility comments: oob in recliner  Transfers Overall transfer level: Needs assistance   Transfers: Sit to/from Stand Sit to Stand: Min guard         General transfer comment: close guard for safety  Ambulation/Gait Ambulation/Gait assistance: Min assist Ambulation Distance (Feet): 145 Feet Assistive device:  (IV pole) Gait Pattern/deviations: Step-through pattern;Decreased stride length     General Gait Details: slow gait speed. unsteady at times. intermittent assist to stabilize  Stairs            Wheelchair Mobility    Modified Rankin (Stroke Patients Only)       Balance Overall balance assessment: Needs assistance         Standing balance support: During functional activity Standing balance-Leahy Scale: Good                               Pertinent Vitals/Pain Pain Assessment: Faces Faces Pain Scale: Hurts little more Pain Location: abdomen Pain Descriptors / Indicators: Aching;Sore Pain Intervention(s): Limited activity within patient's  tolerance;Monitored during session    [place] expects to be discharged to:: Private residence Living Arrangements: Children Available Help at Discharge: Available 24 hours/day Type of Home: House Home Access: Stairs to enter   Technical brewer of Steps: 1+1 Home Layout: One level Home Equipment: Cane - single point      Prior Function Level of Independence: Independent               Hand Dominance        Extremity/Trunk Assessment   Upper Extremity Assessment: Generalized weakness           Lower Extremity Assessment: Generalized weakness      Cervical / Trunk Assessment: Normal  Communication   Communication: No difficulties  Cognition Arousal/Alertness: Awake/alert Behavior During Therapy: WFL for tasks assessed/performed Overall Cognitive Status: Within Functional Limits for tasks assessed                      General Comments      Exercises        Assessment/Plan    PT Assessment Patient needs continued PT services  PT Diagnosis Difficulty walking;Generalized weakness;Acute pain   PT Problem List Decreased strength;Decreased activity tolerance;Decreased mobility;Pain  PT Treatment Interventions Gait training;Functional mobility training;Therapeutic activities;Patient/family education;Therapeutic exercise   PT Goals (Current goals can be found in the Care Plan section) Acute Rehab PT Goals Patient Stated Goal: less pain. feel better PT Goal Formulation: With  patient/family Time For Goal Achievement: 12/06/14 Potential to Achieve Goals: Good    Frequency Min 3X/week   Barriers to discharge        Co-evaluation               End of Session   Activity Tolerance: Patient limited by fatigue Patient left: in chair;with call bell/phone within reach;with family/visitor present           Time: 2244-9753 PT Time Calculation (min) (ACUTE ONLY): 15 min   Charges:   PT Evaluation $Initial PT  Evaluation Tier I: 1 Procedure     PT G Codes:        Weston Anna, MPT Pager: [phone]

## 2014-11-22 NOTE — Progress Notes (Signed)
Progress Note   Leslie Frey POE:[id number] DOB: [birth year] DOA: 11/15/2014 PCP: No primary care provider on file.   Brief Narrative:   Leslie Frey is an 81 y.o. female with past medical history of hypertension, dyslipidemia, CAD who presented to [facility] ED with persistent nausea and vomiting associated with epigastric pain. She was subsequently found to have small bowel obstruction. She has been seen by surgery in consultation.  Initially we thought she many need surgery for which reason we consulted cardiology for preoperative clearance. Patient then had gastrografin study on 11/18/14 and it was noted that contrast had reached colon. At that point surgery has advanced the diet to clear liquids 11/19/14 and clamped NG tube but in less than 24 hours she had to have NG tube reconnected.  Patient continues to have NG tube output although improving in past 48 hours. Her abdominal X ray on 11/21/14 demonstrated improving small bowel obstruction.  Assessment/Plan:    Principal Problem:  SBO (small bowel obstruction) secondary to adhesions / Nausea and vomiting / Epigastric pain - Patient was admitted with nausea, vomiting and epigastric pain. SBO as seen on CT abdomen and abdominal x ray on admission. - Seen by surgery in consultation with recommendations for conservative management. - Conservative management including bowel rest and NG tube gastric decompression with IV fluids and anti-emetics ordered. - Gastrografin study 11/18/14  noted that the contrast has reached the colon. - She continues to have NG tube output although it is improving at least in past 24-48 hours. - Her most recent abdominal x ray 11/21/2014 demonstrated improving small bowel obstruction. - Surgery recommends NPO and to allow ice chips, encourage ambulation . - We will also continue IV fluids for hydration until she resumes PO intake. - Started on Dulcolax suppositories twice a day by general surgery with plans to repeat  abdominal films in the morning.  Active Problems:  HTN (hypertension), essential  - Blood pressure controlled on metoprolol 2.5 mg IV every 12 hours.   Hypokalemia / Hypomagnesemia  - Secondary to GI losses and nasogastric tube output. - Supplemented.  - Both magnesium and potassium WNL.   Normocytic anemia - Slight drop in hemoglobin noted 11/18/14 from normal to 11.2, likely dilutional from IV fluids received for hydration. - We'll continue to monitor.   Leukocytosis - Likely reactive, from small bowel obstruction. - Resolved spontaneously.   Thrombocytopenia - Mild, platelets stable at 144. - Using SCD's bilaterally.    CAD (coronary artery disease), native coronary artery - Stable. No chest pain. - ASA on hold since patient is still NPO. - Has been seen by cardiology in consultation for clearance for potential surgery.   Dyslipidemia - Will resume statin therapy once patient tolerates by mouth intake.  DVT Prophylaxis  - SCD's bilaterally in hospital.   Code Status: Full.  Family Communication:Daughter at the bedside Disposition Plan: Home when small bowel obstruction resolved, likely another 2-3 days.   IV Access:    Peripheral IV   Procedures and diagnostic studies:   Dg Chest 2 View  11/15/2014   CLINICAL DATA:  Mid chest pain, weakness, epigastric pain and emesis today.  EXAM: CHEST  2 VIEW  COMPARISON:  None.  FINDINGS: Lungs are hypoinflated without consolidation or effusion. Cardiomediastinal silhouette is within normal. There is calcified plaque over the thoracic aorta. There are mild degenerate changes of the spine.  IMPRESSION: Hypoinflation without acute cardiopulmonary disease.   Electronically Signed   By: Marin Olp M.D.  On: 11/15/2014 15:54   Dg Abd 1 View  11/17/2014   CLINICAL DATA:  Small bowel obstruction. Multiple abdominal surgeries. Constipation.  EXAM: ABDOMEN - 1 VIEW  COMPARISON:  11/16/2014  FINDINGS: Dilated loops of  small bowel for cyst, measuring up to 4.7 cm in diameter. Nasogastric tube noted, tip in the stomach body. The number of gas-filled small loops appears slightly less than on yesterday' s exam, with caliber of the visualized loops similar. No secondary signs free intraperitoneal gas.  IMPRESSION: 1. There is a reduced number of visible dilated air-filled loops of small bowel, but dilated loops are still present and of similar caliber to the prior exam. Abnormal appearance could reflect obstruction or ileus.   Electronically Signed   By: Van Clines M.D.   On: 11/17/2014 09:10   Dg Abd 1 View  11/16/2014   CLINICAL DATA:  Small bowel obstruction.  EXAM: ABDOMEN - 1 VIEW  COMPARISON:  11/15/2014  FINDINGS: A new nasogastric tube is seen with tip in the proximal stomach. Multiple dilated small bowel loops again seen, with mild decrease in degree of dilatation. No colonic gas visualized. Contrast seen within the urinary bladder from recent CT.  IMPRESSION: Nasogastric tube in appropriate position. Small bowel obstruction, with mild decrease in degree of dilatation since prior exam.   Electronically Signed   By: Earle Gell M.D.   On: 11/16/2014 12:21   Dg Abd 1 View  11/15/2014   CLINICAL DATA:  Vomiting, nausea, epigastric abdominal pain and severe constipation. Reported history of hiatal hernia, appendectomy, hysterectomy and cholecystectomy.  EXAM: ABDOMEN - 1 VIEW  COMPARISON:  None.  FINDINGS: There are prominently dilated small bowel loops throughout the abdomen measuring up to 5.5 cm name in diameter. There is an absence of colonic gas. No evidence of pneumatosis or pneumoperitoneum. Surgical clips are noted in the right upper quadrant. Curvilinear left upper quadrant calcifications are likely atherosclerotic splenic artery calcifications. Ovoid rim calcified structure overlying the right gluteal soft tissues, likely a gluteal granuloma. Visualized lung bases are clear. Moderate degenerative changes  are seen in the visualized thoracolumbar spine.  IMPRESSION: Prominently dilated small bowel loops throughout the abdomen. Paucity of colonic gas. Findings are most suggestive of a distal small bowel obstruction. Consider further evaluation with CT of the abdomen and pelvis with oral and intravenous contrast.  These results were called by telephone at the time of interpretation on 11/15/2014 at 4:47 pm to Dr. Theotis Burrow , who verbally acknowledged these results.   Electronically Signed   By: Ilona Sorrel M.D.   On: 11/15/2014 16:47   Ct Abdomen Pelvis W Contrast  11/15/2014   CLINICAL DATA:  Nausea and vomiting with last bowel movement 11/12/2014. Previous cholecystectomy and appendectomy as well as hysterectomy.  EXAM: CT ABDOMEN AND PELVIS WITH CONTRAST  TECHNIQUE: Multidetector CT imaging of the abdomen and pelvis was performed using the standard protocol following bolus administration of intravenous contrast.  CONTRAST:  168mL OMNIPAQUE IOHEXOL 300 MG/ML  SOLN  COMPARISON:  None.  FINDINGS: Lung bases are within normal. Minimal calcification of the mitral valve annulus. Moderate size hiatal hernia is present.  Abdominal images demonstrate surgical absence of the gallbladder. There is a subcentimeter hypodensity over the left lobe of the liver too small to characterize but likely a cyst. The spleen, pancreas and adrenal glands are normal.  Kidneys are normal in size without hydronephrosis or nephrolithiasis. There is a subcentimeter hypodensity over the lower pole and upper pole cortex  of the right kidney likely cysts but too small to characterize.  There are multiple dilated fluid in air-filled small bowel loops measuring up to 4.7 cm in diameter compatible with a small bowel obstruction. Site of obstruction is due to two areas of small bowel stricturing over the mid abdomen just anterior to the aortic bifurcation likely due to adhesions. There is obstruction of small bowel proximal to the more proximal  stricturing as well as dilatation of a short segment of small bowel between the 2 areas of stricturing likely a closed-loop component. No evidence of pneumatosis or free peritoneal air. No evidence of free fluid. Small bowel distal to these areas of stricturing is decompressed. Colon is decompressed.  Pelvic images demonstrate the bladder and rectum to be within normal. Surgical absence of the uterus. Tiny amount of free fluid in the pelvis.  There mild degenerate changes of the spine and hips.  IMPRESSION: Mid to distal small bowel obstruction likely due to adhesions over the mid abdomen just anterior to the aortic bifurcation. There is dilatation of the small bowel proximal to the more proximal area of stricturing. There is a second area of small bowel stricturing with a dilated loop caught between these 2 areas of stricturing likely a closed loop component. No pneumatosis or free air.  Subcentimeter liver hypodensity too small to characterize but likely a cyst. Couple subcentimeter right renal cortical hypodensities too small to characterize but likely cysts.  Postsurgical changes as described.  Moderate size hiatal hernia.  These results were called by telephone at the time of interpretation on 11/15/2014 at 7:05 pm to Dr. Theotis Burrow , who verbally acknowledged these results.   Electronically Signed   By: Marin Olp M.D.   On: 11/15/2014 19:05   Dg Abd 2 Views  11/21/2014   CLINICAL DATA:  Abdominal distention this morning. Follow-up small bowel obstruction.  EXAM: ABDOMEN - 2 VIEW  COMPARISON:  11/20/2014  FINDINGS: Degree of small bowel distention decreased since prior study. Mildly prominent sigmoid colon loops in the lower abdomen and upper pelvis. No free air. No organomegaly. Oral contrast material noted within the right colon and transverse colon.  IMPRESSION: Improving small bowel distention. Mild gaseous distention of the colon currently.   Electronically Signed   By: Rolm Baptise M.D.   On:  11/21/2014 08:46   Dg Abd 2 Views  11/20/2014   CLINICAL DATA:  Small bowel obstruction.  Upper abdominal pain.  EXAM: ABDOMEN - 2 VIEW  COMPARISON:  11/18/2014  FINDINGS: No intraperitoneal free air is identified. Enteric tube terminates over the expected region of the gastric body. Residual oral contrast material is again seen in the colon. There is persistent prominent dilatation of multiple small bowel loops measuring up to 6-6.5 cm in diameter, stable to slightly increased from the prior study. Upper abdominal surgical clips are noted.  IMPRESSION: Stable to slightly increased small bowel dilatation consistent with ongoing obstruction.   Electronically Signed   By: Logan Bores M.D.   On: 11/20/2014 12:13   Dg Abd Portable 1v  11/18/2014   CLINICAL DATA:  Small bowel obstruction protocol. Patient was given oral contrast at 10:30 a.m.  EXAM: PORTABLE ABDOMEN - 1 VIEW  COMPARISON:  11/18/2014 at 9:25 a.m.  FINDINGS: Contrast as extended into the colon. There is small bowel dilation similar to the earlier study.  Nasogastric tube tip projects in the mid stomach.  IMPRESSION: 1. Contrast as extended into the colon.  Colon is nondistended. 2.  There is persistent small bowel dilation similar to the earlier study. Findings are consistent with a partial small bowel obstruction.   Electronically Signed   By: Lajean Manes M.D.   On: 11/18/2014 18:59   Dg Abd Portable 1v-small Bowel Obstruction Protocol-initial, 8 Hr Delay  11/18/2014   CLINICAL DATA:  Abdominal pain and distension, small bowel obstruction on recent CT and plain film examination.  EXAM: PORTABLE ABDOMEN - 1 VIEW  COMPARISON:  11/17/2014  FINDINGS: Nasogastric catheter remains in the stomach. Scattered large and small bowel gas is noted. Small bowel is dilated to 5.2 cm. The overall appearance is similar to that seen on the prior exam. This likely represents a partial small bowel obstruction given the degree of colonic gas.  IMPRESSION:  Persistent small bowel obstruction with dilatation of the small bowel to 5.2 cm.   Electronically Signed   By: Inez Catalina M.D.   On: 11/18/2014 10:09     Medical Consultants:    Surgery: Armandina Gemma, MD  Cardiology: Josue Hector, MD  Anti-Infectives:   Anti-infectives    None      Subjective:   Circe Tiu denies abdominal pain, nausea.  Still has an NG tube in draining bilious fluid.  Has some throat irritation from NG tube and a cough. No flatus but reports having had a loose BM yesterday.  Objective:    Filed Vitals:   11/21/14 0518 11/21/14 1505 11/21/14 2204 11/22/14 0623  BP: 140/77 138/91 110/64 135/74  Pulse: 72 79 66 74  Temp: 98.9 F (37.2 C) 99.1 F (37.3 C) 97.8 F (36.6 C) 98.2 F (36.8 C)  TempSrc: Oral Oral Oral Oral  Resp: 18 18 18 18   Height:      Weight:      SpO2: 99% 98% 98% 97%    Intake/Output Summary (Last 24 hours) at 11/22/14 0724 Last data filed at 11/22/14 0700  Gross per 24 hour  Intake   1125 ml  Output   1850 ml  Net   -725 ml   Filed Weights   11/16/14 1348  Weight: 62.596 kg (138 lb)    Exam: Gen:  Uncomfortable appearing Cardiovascular:  RRR, No M/R/G Respiratory:  Lungs CTAB Gastrointestinal:  Abdomen softly distended, NT,  + BS Extremities:  No C/E/C   Data Reviewed:    Labs: Basic Metabolic Panel:  Recent Labs Lab 11/17/14 0905 11/18/14 0510 11/19/14 0540 11/21/14 1136 11/22/14 0540  NA 141 146* 144 138 139  K 3.1* 3.2* 3.8 4.4 4.2  CL 104 111 109 107 106  CO2 30 27 29 28 27   GLUCOSE 107* 86 133* 134* 115*  BUN 16 15 13  5* 7  CREATININE 0.88 0.79 0.83 0.85 0.81  CALCIUM 8.7* 8.6* 8.5* 8.5* 8.7*  MG  --  1.6* 2.1  --   --    GFR Estimated Creatinine Clearance: 45.7 mL/min (by C-G formula based on Cr of 0.81). Liver Function Tests:  Recent Labs Lab 11/15/14 1504  AST 31  ALT 17  ALKPHOS 68  BILITOT 0.6  PROT 7.8  ALBUMIN 4.8    Recent Labs Lab 11/15/14 1504  LIPASE 27    Coagulation profile  Recent Labs Lab 11/17/14 0905  INR 1.07    CBC:  Recent Labs Lab 11/17/14 0905 11/18/14 0510 11/19/14 0540 11/21/14 1136 11/22/14 0540  WBC 10.0 7.1 8.3 10.4 7.1  HGB 12.5 11.2* 12.0 11.3* 11.1*  HCT 37.9 34.8* 35.9* 34.3* 33.1*  MCV 93.8 95.6  94.7 93.0 93.2  PLT 133* 125* 125* 140* 144*   Sepsis Labs:  Recent Labs Lab 11/15/14 2007  11/18/14 0510 11/19/14 0540 11/21/14 1136 11/22/14 0540  WBC  --   < > 7.1 8.3 10.4 7.1  LATICACIDVEN 1.40  --   --   --   --   --   < > = values in this interval not displayed. Microbiology Recent Results (from the past 240 hour(s))  Culture, Urine     Status: None   Collection Time: 11/16/14 11:57 AM  Result Value Ref Range Status   Specimen Description URINE, RANDOM  Final   Special Requests NONE  Final   Culture   Final    MULTIPLE SPECIES PRESENT, SUGGEST RECOLLECTION Performed at [facility]    Report Status 11/17/2014 FINAL  Final     Medications:   . antiseptic oral rinse  7 mL Mouth Rinse q12n4p  . brimonidine  1 drop Both Eyes BID   And  . timolol  1 drop Both Eyes BID  . chlorhexidine  15 mL Mouth Rinse BID  . gabapentin  300 mg Oral BID  . latanoprost  1 drop Both Eyes QHS  . lubiprostone  24 mcg Oral BID WC  . metoprolol  2.5 mg Intravenous Q12H  . pantoprazole (PROTONIX) IV  40 mg Intravenous Daily  . traZODone  50 mg Oral QHS   Continuous Infusions: . dextrose 5 % and 0.45 % NaCl with KCl 40 mEq/L 75 mL/hr at 11/21/14 1728    Time spent: 25 minutes.   LOS: 7 days   [facility] Pager [phone]. If unable to reach me by pager, please call my cell phone at [phone].  *Please refer to [email or web address], password [email or web address] to get updated schedule on who will round on this patient, as hospitalists switch teams weekly. If 7PM-7AM, please contact night-coverage at [email or web address], password [email or web address] for any overnight needs.  11/22/2014, 7:24 AM

## 2014-11-23 ENCOUNTER — Inpatient Hospital Stay (HOSPITAL_COMMUNITY): Payer: Medicare Other | Admitting: Anesthesiology

## 2014-11-23 ENCOUNTER — Inpatient Hospital Stay (HOSPITAL_COMMUNITY): Payer: Medicare Other

## 2014-11-23 ENCOUNTER — Encounter (HOSPITAL_COMMUNITY)
Admission: EM | Disposition: A | Discharge status: Home / Self Care | Payer: Self-pay | Source: Home / Self Care | Attending: Internal Medicine

## 2014-11-23 ENCOUNTER — Encounter (HOSPITAL_COMMUNITY): Payer: Self-pay | Admitting: Anesthesiology

## 2014-11-23 DIAGNOSIS — I498 Other specified cardiac arrhythmias: Secondary | ICD-10-CM

## 2014-11-23 DIAGNOSIS — R008 Other abnormalities of heart beat: Secondary | ICD-10-CM | POA: Diagnosis not present

## 2014-11-23 DIAGNOSIS — I499 Cardiac arrhythmia, unspecified: Secondary | ICD-10-CM

## 2014-11-23 HISTORY — PX: LAPAROSCOPY: SHX197

## 2014-11-23 HISTORY — PX: LAPAROSCOPIC LYSIS OF ADHESIONS: SHX5905

## 2014-11-23 LAB — BASIC METABOLIC PANEL
ANION GAP: 9 (ref 5–15)
BUN: 5 mg/dL — AB (ref 6–20)
CALCIUM: 9.1 mg/dL (ref 8.9–10.3)
CO2: 26 mmol/L (ref 22–32)
CREATININE: 0.82 mg/dL (ref 0.44–1.00)
Chloride: 102 mmol/L (ref 101–111)
GFR calc Af Amer: >60 mL/min (ref >60)
GLUCOSE: 121 mg/dL — AB (ref 65–99)
Potassium: 4 mmol/L (ref 3.5–5.1)
Sodium: 137 mmol/L (ref 135–145)

## 2014-11-23 LAB — TYPE AND SCREEN
ABO/RH(D): A POS
Antibody Screen: NEGATIVE

## 2014-11-23 LAB — GLUCOSE, CAPILLARY
Glucose-Capillary: 138 mg/dL — ABNORMAL HIGH (ref 65–99)
Glucose-Capillary: 183 mg/dL — ABNORMAL HIGH (ref 65–99)

## 2014-11-23 SURGERY — LAPAROSCOPY, DIAGNOSTIC
Anesthesia: General | Site: Abdomen

## 2014-11-23 MED ORDER — FENTANYL CITRATE (PF) 100 MCG/2ML IJ SOLN
INTRAMUSCULAR | Status: DC | PRN
  Administered 2014-11-23 (×3): 50 ug via INTRAVENOUS

## 2014-11-23 MED ORDER — HYDROMORPHONE HCL 1 MG/ML IJ SOLN
INTRAMUSCULAR | Status: AC
  Filled 2014-11-23: qty 1

## 2014-11-23 MED ORDER — FENTANYL CITRATE (PF) 250 MCG/5ML IJ SOLN
INTRAMUSCULAR | Status: AC
  Filled 2014-11-23: qty 25

## 2014-11-23 MED ORDER — ROCURONIUM BROMIDE 100 MG/10ML IV SOLN
INTRAVENOUS | Status: AC
  Filled 2014-11-23: qty 1

## 2014-11-23 MED ORDER — DEXTROSE 5 % IV SOLN
INTRAVENOUS | Status: AC
Start: 2014-11-23 — End: 2014-11-23
  Filled 2014-11-23: qty 2

## 2014-11-23 MED ORDER — LACTATED RINGERS IV SOLN
INTRAVENOUS | Status: DC
  Administered 2014-11-23: 1000 mL via INTRAVENOUS

## 2014-11-23 MED ORDER — SODIUM CHLORIDE 0.9 % IJ SOLN
10.0000 mL | INTRAMUSCULAR | Status: DC | PRN
  Administered 2014-11-25 – 2014-11-30 (×6): 10 mL
  Filled 2014-11-23 (×6): qty 40

## 2014-11-23 MED ORDER — GLYCOPYRROLATE 0.2 MG/ML IJ SOLN
INTRAMUSCULAR | Status: DC | PRN
  Administered 2014-11-23: 0.6 mg via INTRAVENOUS

## 2014-11-23 MED ORDER — LACTATED RINGERS IV SOLN
INTRAVENOUS | Status: DC | PRN
  Administered 2014-11-23 (×2): via INTRAVENOUS

## 2014-11-23 MED ORDER — METOPROLOL TARTRATE 1 MG/ML IV SOLN
2.5000 mg | Freq: Four times a day (QID) | INTRAVENOUS | Status: DC
  Administered 2014-11-24 – 2014-11-25 (×2): 2.5 mg via INTRAVENOUS
  Filled 2014-11-23 (×10): qty 5

## 2014-11-23 MED ORDER — FAT EMULSION 20 % IV EMUL
250.0000 mL | INTRAVENOUS | Status: AC
  Administered 2014-11-23: 250 mL via INTRAVENOUS
  Filled 2014-11-23: qty 250

## 2014-11-23 MED ORDER — PHENYLEPHRINE HCL 10 MG/ML IJ SOLN
INTRAMUSCULAR | Status: DC | PRN
  Administered 2014-11-23: 40 ug via INTRAVENOUS

## 2014-11-23 MED ORDER — HYDROMORPHONE HCL 1 MG/ML IJ SOLN
0.2500 mg | INTRAMUSCULAR | Status: DC | PRN
  Administered 2014-11-23 (×2): 0.5 mg via INTRAVENOUS

## 2014-11-23 MED ORDER — MORPHINE SULFATE (PF) 2 MG/ML IV SOLN
2.0000 mg | INTRAVENOUS | Status: DC | PRN
  Administered 2014-11-26 – 2014-11-28 (×4): 2 mg via INTRAVENOUS
  Filled 2014-11-23 (×4): qty 1

## 2014-11-23 MED ORDER — PROPOFOL 10 MG/ML IV BOLUS
INTRAVENOUS | Status: DC | PRN
  Administered 2014-11-23: 100 mg via INTRAVENOUS

## 2014-11-23 MED ORDER — KCL IN DEXTROSE-NACL 40-5-0.45 MEQ/L-%-% IV SOLN
INTRAVENOUS | Status: DC
  Filled 2014-11-23 (×2): qty 1000

## 2014-11-23 MED ORDER — LIDOCAINE HCL (CARDIAC) 20 MG/ML IV SOLN
INTRAVENOUS | Status: DC | PRN
  Administered 2014-11-23: 50 mg via INTRAVENOUS

## 2014-11-23 MED ORDER — PHENYLEPHRINE HCL 10 MG/ML IJ SOLN
INTRAMUSCULAR | Status: AC
  Filled 2014-11-23: qty 1

## 2014-11-23 MED ORDER — DEXTROSE 5 % IV SOLN
2.0000 g | INTRAVENOUS | Status: AC
  Administered 2014-11-23: 2 g via INTRAVENOUS
  Filled 2014-11-23: qty 2

## 2014-11-23 MED ORDER — TRACE MINERALS CR-CU-MN-SE-ZN 10-1000-500-60 MCG/ML IV SOLN
INTRAVENOUS | Status: AC
Start: 2014-11-23 — End: 2014-11-24
  Administered 2014-11-23: 18:00:00 via INTRAVENOUS
  Filled 2014-11-23: qty 960

## 2014-11-23 MED ORDER — BUPIVACAINE-EPINEPHRINE (PF) 0.25% -1:200000 IJ SOLN
INTRAMUSCULAR | Status: AC
  Filled 2014-11-23: qty 30

## 2014-11-23 MED ORDER — FENTANYL CITRATE (PF) 100 MCG/2ML IJ SOLN
INTRAMUSCULAR | Status: AC
  Filled 2014-11-23: qty 2

## 2014-11-23 MED ORDER — FENTANYL CITRATE (PF) 100 MCG/2ML IJ SOLN
25.0000 ug | INTRAMUSCULAR | Status: DC | PRN
  Administered 2014-11-23 (×2): 50 ug via INTRAVENOUS

## 2014-11-23 MED ORDER — PROPOFOL 10 MG/ML IV BOLUS
INTRAVENOUS | Status: AC
  Filled 2014-11-23: qty 20

## 2014-11-23 MED ORDER — INSULIN ASPART 100 UNIT/ML ~~LOC~~ SOLN
0.0000 [IU] | SUBCUTANEOUS | Status: DC
  Administered 2014-11-23: 1 [IU] via SUBCUTANEOUS
  Administered 2014-11-23 – 2014-11-24 (×2): 2 [IU] via SUBCUTANEOUS
  Administered 2014-11-24 (×3): 1 [IU] via SUBCUTANEOUS
  Administered 2014-11-24: 2 [IU] via SUBCUTANEOUS
  Administered 2014-11-25: 1 [IU] via SUBCUTANEOUS
  Administered 2014-11-25: 2 [IU] via SUBCUTANEOUS
  Administered 2014-11-25 – 2014-11-26 (×4): 1 [IU] via SUBCUTANEOUS
  Administered 2014-11-26: 2 [IU] via SUBCUTANEOUS
  Administered 2014-11-26 – 2014-11-28 (×8): 1 [IU] via SUBCUTANEOUS

## 2014-11-23 MED ORDER — BUPIVACAINE-EPINEPHRINE (PF) 0.25% -1:200000 IJ SOLN
INTRAMUSCULAR | Status: DC | PRN
  Administered 2014-11-23: 15 mL

## 2014-11-23 MED ORDER — SUCCINYLCHOLINE CHLORIDE 20 MG/ML IJ SOLN
INTRAMUSCULAR | Status: DC | PRN
  Administered 2014-11-23: 80 mg via INTRAVENOUS

## 2014-11-23 MED ORDER — 0.9 % SODIUM CHLORIDE (POUR BTL) OPTIME
TOPICAL | Status: DC | PRN
  Administered 2014-11-23: 2000 mL

## 2014-11-23 MED ORDER — NEOSTIGMINE METHYLSULFATE 10 MG/10ML IV SOLN
INTRAVENOUS | Status: DC | PRN
  Administered 2014-11-23: 3 mg via INTRAVENOUS

## 2014-11-23 MED ORDER — ROCURONIUM BROMIDE 100 MG/10ML IV SOLN
INTRAVENOUS | Status: DC | PRN
  Administered 2014-11-23: 20 mg via INTRAVENOUS
  Administered 2014-11-23: 10 mg via INTRAVENOUS

## 2014-11-23 SURGICAL SUPPLY — 68 items
APPLIER CLIP 5 13 M/L LIGAMAX5 (MISCELLANEOUS)
APPLIER CLIP ROT 10 11.4 M/L (STAPLE)
BLADE EXTENDED COATED 6.5IN (ELECTRODE) IMPLANT
BLADE HEX COATED 2.75 (ELECTRODE) IMPLANT
BLADE SURG SZ10 CARB STEEL (BLADE) IMPLANT
CLIP APPLIE 5 13 M/L LIGAMAX5 (MISCELLANEOUS) IMPLANT
CLIP APPLIE ROT 10 11.4 M/L (STAPLE) IMPLANT
CLOSURE WOUND 1/2 X4 (GAUZE/BANDAGES/DRESSINGS) ×1
COVER MAYO STAND STRL (DRAPES) ×4 IMPLANT
COVER SURGICAL LIGHT HANDLE (MISCELLANEOUS) IMPLANT
DECANTER SPIKE VIAL GLASS SM (MISCELLANEOUS) ×4 IMPLANT
DRAPE LAPAROSCOPIC ABDOMINAL (DRAPES) ×4 IMPLANT
DRAPE WARM FLUID 44X44 (DRAPE) IMPLANT
ELECT PENCIL ROCKER SW 15FT (MISCELLANEOUS) ×4 IMPLANT
ELECT REM PT RETURN 9FT ADLT (ELECTROSURGICAL) ×4
ELECTRODE REM PT RTRN 9FT ADLT (ELECTROSURGICAL) ×2 IMPLANT
FILTER SMOKE EVAC LAPAROSHD (FILTER) IMPLANT
GAUZE SPONGE 4X4 12PLY STRL (GAUZE/BANDAGES/DRESSINGS) ×4 IMPLANT
GLOVE BIO SURGEON STRL SZ7 (GLOVE) ×4 IMPLANT
GLOVE BIO SURGEON STRL SZ7.5 (GLOVE) ×4 IMPLANT
GLOVE BIOGEL M 7.0 STRL (GLOVE) ×4 IMPLANT
GLOVE BIOGEL M STRL SZ7.5 (GLOVE) IMPLANT
GLOVE BIOGEL PI IND STRL 7.0 (GLOVE) ×2 IMPLANT
GLOVE BIOGEL PI INDICATOR 7.0 (GLOVE) ×2
GLOVE INDICATOR 8.0 STRL GRN (GLOVE) ×4 IMPLANT
GOWN STRL REUS W/ TWL XL LVL3 (GOWN DISPOSABLE) ×4 IMPLANT
GOWN STRL REUS W/TWL LRG LVL3 (GOWN DISPOSABLE) ×12 IMPLANT
GOWN STRL REUS W/TWL XL LVL3 (GOWN DISPOSABLE) ×4
KIT BASIN OR (CUSTOM PROCEDURE TRAY) ×4 IMPLANT
LIGASURE IMPACT 36 18CM CVD LR (INSTRUMENTS) IMPLANT
LIQUID BAND (GAUZE/BANDAGES/DRESSINGS) ×4 IMPLANT
NS IRRIG 1000ML POUR BTL (IV SOLUTION) ×4 IMPLANT
SEPRAFILM MEMBRANE 5X6 (MISCELLANEOUS) ×4 IMPLANT
SET IRRIG TUBING LAPAROSCOPIC (IRRIGATION / IRRIGATOR) IMPLANT
SHEARS HARMONIC ACE PLUS 36CM (ENDOMECHANICALS) IMPLANT
SOLUTION ANTI FOG 6CC (MISCELLANEOUS) ×4 IMPLANT
SPONGE LAP 18X18 X RAY DECT (DISPOSABLE) ×4 IMPLANT
STAPLER VISISTAT 35W (STAPLE) IMPLANT
STRIP CLOSURE SKIN 1/2X4 (GAUZE/BANDAGES/DRESSINGS) ×3 IMPLANT
SUCTION POOLE TIP (SUCTIONS) ×4 IMPLANT
SUT NOVA 1 T20/GS 25DT (SUTURE) ×8 IMPLANT
SUT NOVA NAB DX-16 0-1 5-0 T12 (SUTURE) ×4 IMPLANT
SUT PDS AB 1 TP1 96 (SUTURE) IMPLANT
SUT PROLENE 2 0 KS (SUTURE) IMPLANT
SUT PROLENE 2 0 SH DA (SUTURE) IMPLANT
SUT SILK 2 0 (SUTURE)
SUT SILK 2 0 SH CR/8 (SUTURE) IMPLANT
SUT SILK 2-0 18XBRD TIE 12 (SUTURE) IMPLANT
SUT SILK 3 0 (SUTURE)
SUT SILK 3 0 SH CR/8 (SUTURE) IMPLANT
SUT SILK 3-0 18XBRD TIE 12 (SUTURE) IMPLANT
SUT VIC AB 2-0 SH 27 (SUTURE) ×4
SUT VIC AB 2-0 SH 27X BRD (SUTURE) ×4 IMPLANT
SUT VIC AB 4-0 PS2 27 (SUTURE) ×4 IMPLANT
SYS LAPSCP GELPORT 120MM (MISCELLANEOUS)
SYSTEM LAPSCP GELPORT 120MM (MISCELLANEOUS) IMPLANT
TOWEL OR 17X26 10 PK STRL BLUE (TOWEL DISPOSABLE) ×4 IMPLANT
TRAY FOLEY W/METER SILVER 14FR (SET/KITS/TRAYS/PACK) ×4 IMPLANT
TRAY FOLEY W/METER SILVER 16FR (SET/KITS/TRAYS/PACK) IMPLANT
TRAY LAPAROSCOPIC (CUSTOM PROCEDURE TRAY) ×4 IMPLANT
TROCAR BLADELESS OPT 5 75 (ENDOMECHANICALS) ×4 IMPLANT
TROCAR XCEL 12X100 BLDLESS (ENDOMECHANICALS) IMPLANT
TROCAR XCEL BLUNT TIP 100MML (ENDOMECHANICALS) ×4 IMPLANT
TROCAR XCEL NON-BLD 11X100MML (ENDOMECHANICALS) IMPLANT
TROCAR XCEL UNIV SLVE 11M 100M (ENDOMECHANICALS) IMPLANT
TUBING INSUFFLATION 10FT LAP (TUBING) ×4 IMPLANT
YANKAUER SUCT BULB TIP 10FT TU (MISCELLANEOUS) IMPLANT
YANKAUER SUCT BULB TIP NO VENT (SUCTIONS) ×4 IMPLANT

## 2014-11-23 NOTE — Anesthesia Preprocedure Evaluation (Addendum)
Anesthesia Evaluation  Patient identified by MRN, date of birth, ID band Patient awake    Reviewed: Allergy & Precautions, NPO status , Patient's Chart, lab work & pertinent test results  Airway Mallampati: II  TM Distance: >3 FB Neck ROM: Full    Dental   Pulmonary former smoker,    breath sounds clear to auscultation       Cardiovascular hypertension, + CAD   Rhythm:Regular Rate:Normal     Neuro/Psych    GI/Hepatic Neg liver ROS, hiatal hernia, GERD  ,  Endo/Other  negative endocrine ROS  Renal/GU negative Renal ROS     Musculoskeletal  (+) Arthritis ,   Abdominal   Peds  Hematology  (+) anemia ,   Anesthesia Other Findings   Reproductive/Obstetrics                            Anesthesia Physical Anesthesia Plan  ASA: III  Anesthesia Plan: General   Post-op Pain Management:    Induction: Intravenous  Airway Management Planned: Oral ETT  Additional Equipment:   Intra-op Plan:   Post-operative Plan: Possible Post-op intubation/ventilation  Informed Consent: I have reviewed the patients History and Physical, chart, labs and discussed the procedure including the risks, benefits and alternatives for the proposed anesthesia with the patient or authorized representative who has indicated his/her understanding and acceptance.   Dental advisory given  Plan Discussed with: CRNA and Anesthesiologist  Anesthesia Plan Comments:         Anesthesia Quick Evaluation

## 2014-11-23 NOTE — Progress Notes (Signed)
Tele DC per order on 11/22/14.

## 2014-11-23 NOTE — Progress Notes (Signed)
Progress Note   Leslie Frey GQQ:[id number] DOB: [birth year] DOA: 11/15/2014 PCP: No primary care provider on file.   Brief Narrative:   Leslie Frey is an 81 y.o. female with past medical history of hypertension, dyslipidemia, CAD who presented to [facility] ED with persistent nausea and vomiting associated with epigastric pain. She was subsequently found to have small bowel obstruction. She has been seen by surgery in consultation.  Initially we thought she many need surgery for which reason we consulted cardiology for preoperative clearance. Patient then had gastrografin study on 11/18/14 and it was noted that contrast had reached colon. At that point surgery has advanced the diet to clear liquids 11/19/14 and clamped NG tube but in less than 24 hours she had to have NG tube reconnected.  Patient continues to have NG tube output although improving in past 48 hours. Her abdominal X ray on 11/21/14 demonstrated improving small bowel obstruction.  Assessment/Plan:    Principal Problem:  SBO (small bowel obstruction) secondary to adhesions / Nausea and vomiting / Epigastric pain - Patient was admitted with N/V and epigastric pain. SBO as seen on CT abdomen and abdominal x ray on admission. - Seen by surgery in consultation with recommendations for conservative management. - Continue bowel rest and NG tube gastric decompression with IVF and anti-emetics ordered. - Gastrografin study 11/18/14  noted that the contrast has reached the colon. - Her most recent abdominal x ray 11/21/2014 demonstrated improving small bowel obstruction. - For laparoscopy today, no resolution of SBO yet.  Active Problems:   Bigeminy/trigeminy - Noted on telemetry overnight. Had 2 D Echo 11/18/14.  Will increase beta blocker dose.   HTN (hypertension), essential  - Blood pressure controlled on metoprolol 2.5 mg IV every 12 hours.   Hypokalemia / Hypomagnesemia  - Secondary to GI losses and nasogastric tube output. -  Supplemented. K+ remains WNL.   Normocytic anemia - Slight drop in hemoglobin noted 11/18/14 from normal to 11.2, likely dilutional from IV fluids received for hydration. - We'll continue to monitor.   Leukocytosis - Likely reactive, from small bowel obstruction. - Resolved spontaneously.   Thrombocytopenia - Mild, platelets stable at 144. - Using SCD's bilaterally.    CAD (coronary artery disease), native coronary artery - Stable. No chest pain. - ASA on hold since patient is still NPO. - Has been seen by cardiology in consultation for clearance for potential surgery.   Dyslipidemia - Will resume statin therapy once patient tolerates by mouth intake.  DVT Prophylaxis  - SCD's bilaterally in hospital.   Code Status: Full.  Family Communication:Daughter at the bedside Disposition Plan: Home when small bowel obstruction resolved, likely another 2-3 days.   IV Access:    Peripheral IV   Procedures and diagnostic studies:   Dg Chest 2 View  11/15/2014   CLINICAL DATA:  Mid chest pain, weakness, epigastric pain and emesis today.  EXAM: CHEST  2 VIEW  COMPARISON:  None.  FINDINGS: Lungs are hypoinflated without consolidation or effusion. Cardiomediastinal silhouette is within normal. There is calcified plaque over the thoracic aorta. There are mild degenerate changes of the spine.  IMPRESSION: Hypoinflation without acute cardiopulmonary disease.   Electronically Signed   By: Marin Olp M.D.   On: 11/15/2014 15:54   Dg Abd 1 View  11/17/2014   CLINICAL DATA:  Small bowel obstruction. Multiple abdominal surgeries. Constipation.  EXAM: ABDOMEN - 1 VIEW  COMPARISON:  11/16/2014  FINDINGS: Dilated loops of small bowel for cyst,  measuring up to 4.7 cm in diameter. Nasogastric tube noted, tip in the stomach body. The number of gas-filled small loops appears slightly less than on yesterday' s exam, with caliber of the visualized loops similar. No secondary signs free  intraperitoneal gas.  IMPRESSION: 1. There is a reduced number of visible dilated air-filled loops of small bowel, but dilated loops are still present and of similar caliber to the prior exam. Abnormal appearance could reflect obstruction or ileus.   Electronically Signed   By: Van Clines M.D.   On: 11/17/2014 09:10   Dg Abd 1 View  11/16/2014   CLINICAL DATA:  Small bowel obstruction.  EXAM: ABDOMEN - 1 VIEW  COMPARISON:  11/15/2014  FINDINGS: A new nasogastric tube is seen with tip in the proximal stomach. Multiple dilated small bowel loops again seen, with mild decrease in degree of dilatation. No colonic gas visualized. Contrast seen within the urinary bladder from recent CT.  IMPRESSION: Nasogastric tube in appropriate position. Small bowel obstruction, with mild decrease in degree of dilatation since prior exam.   Electronically Signed   By: Earle Gell M.D.   On: 11/16/2014 12:21   Dg Abd 1 View  11/15/2014   CLINICAL DATA:  Vomiting, nausea, epigastric abdominal pain and severe constipation. Reported history of hiatal hernia, appendectomy, hysterectomy and cholecystectomy.  EXAM: ABDOMEN - 1 VIEW  COMPARISON:  None.  FINDINGS: There are prominently dilated small bowel loops throughout the abdomen measuring up to 5.5 cm name in diameter. There is an absence of colonic gas. No evidence of pneumatosis or pneumoperitoneum. Surgical clips are noted in the right upper quadrant. Curvilinear left upper quadrant calcifications are likely atherosclerotic splenic artery calcifications. Ovoid rim calcified structure overlying the right gluteal soft tissues, likely a gluteal granuloma. Visualized lung bases are clear. Moderate degenerative changes are seen in the visualized thoracolumbar spine.  IMPRESSION: Prominently dilated small bowel loops throughout the abdomen. Paucity of colonic gas. Findings are most suggestive of a distal small bowel obstruction. Consider further evaluation with CT of the abdomen  and pelvis with oral and intravenous contrast.  These results were called by telephone at the time of interpretation on 11/15/2014 at 4:47 pm to Dr. Theotis Burrow , who verbally acknowledged these results.   Electronically Signed   By: Ilona Sorrel M.D.   On: 11/15/2014 16:47   Ct Abdomen Pelvis W Contrast  11/15/2014   CLINICAL DATA:  Nausea and vomiting with last bowel movement 11/12/2014. Previous cholecystectomy and appendectomy as well as hysterectomy.  EXAM: CT ABDOMEN AND PELVIS WITH CONTRAST  TECHNIQUE: Multidetector CT imaging of the abdomen and pelvis was performed using the standard protocol following bolus administration of intravenous contrast.  CONTRAST:  177mL OMNIPAQUE IOHEXOL 300 MG/ML  SOLN  COMPARISON:  None.  FINDINGS: Lung bases are within normal. Minimal calcification of the mitral valve annulus. Moderate size hiatal hernia is present.  Abdominal images demonstrate surgical absence of the gallbladder. There is a subcentimeter hypodensity over the left lobe of the liver too small to characterize but likely a cyst. The spleen, pancreas and adrenal glands are normal.  Kidneys are normal in size without hydronephrosis or nephrolithiasis. There is a subcentimeter hypodensity over the lower pole and upper pole cortex of the right kidney likely cysts but too small to characterize.  There are multiple dilated fluid in air-filled small bowel loops measuring up to 4.7 cm in diameter compatible with a small bowel obstruction. Site of obstruction is due to two  areas of small bowel stricturing over the mid abdomen just anterior to the aortic bifurcation likely due to adhesions. There is obstruction of small bowel proximal to the more proximal stricturing as well as dilatation of a short segment of small bowel between the 2 areas of stricturing likely a closed-loop component. No evidence of pneumatosis or free peritoneal air. No evidence of free fluid. Small bowel distal to these areas of stricturing is  decompressed. Colon is decompressed.  Pelvic images demonstrate the bladder and rectum to be within normal. Surgical absence of the uterus. Tiny amount of free fluid in the pelvis.  There mild degenerate changes of the spine and hips.  IMPRESSION: Mid to distal small bowel obstruction likely due to adhesions over the mid abdomen just anterior to the aortic bifurcation. There is dilatation of the small bowel proximal to the more proximal area of stricturing. There is a second area of small bowel stricturing with a dilated loop caught between these 2 areas of stricturing likely a closed loop component. No pneumatosis or free air.  Subcentimeter liver hypodensity too small to characterize but likely a cyst. Couple subcentimeter right renal cortical hypodensities too small to characterize but likely cysts.  Postsurgical changes as described.  Moderate size hiatal hernia.  These results were called by telephone at the time of interpretation on 11/15/2014 at 7:05 pm to Dr. Theotis Burrow , who verbally acknowledged these results.   Electronically Signed   By: Marin Olp M.D.   On: 11/15/2014 19:05   Dg Abd 2 Views  11/21/2014   CLINICAL DATA:  Abdominal distention this morning. Follow-up small bowel obstruction.  EXAM: ABDOMEN - 2 VIEW  COMPARISON:  11/20/2014  FINDINGS: Degree of small bowel distention decreased since prior study. Mildly prominent sigmoid colon loops in the lower abdomen and upper pelvis. No free air. No organomegaly. Oral contrast material noted within the right colon and transverse colon.  IMPRESSION: Improving small bowel distention. Mild gaseous distention of the colon currently.   Electronically Signed   By: Rolm Baptise M.D.   On: 11/21/2014 08:46   Dg Abd 2 Views  11/20/2014   CLINICAL DATA:  Small bowel obstruction.  Upper abdominal pain.  EXAM: ABDOMEN - 2 VIEW  COMPARISON:  11/18/2014  FINDINGS: No intraperitoneal free air is identified. Enteric tube terminates over the expected region  of the gastric body. Residual oral contrast material is again seen in the colon. There is persistent prominent dilatation of multiple small bowel loops measuring up to 6-6.5 cm in diameter, stable to slightly increased from the prior study. Upper abdominal surgical clips are noted.  IMPRESSION: Stable to slightly increased small bowel dilatation consistent with ongoing obstruction.   Electronically Signed   By: Logan Bores M.D.   On: 11/20/2014 12:13   Dg Abd Portable 1v  11/18/2014   CLINICAL DATA:  Small bowel obstruction protocol. Patient was given oral contrast at 10:30 a.m.  EXAM: PORTABLE ABDOMEN - 1 VIEW  COMPARISON:  11/18/2014 at 9:25 a.m.  FINDINGS: Contrast as extended into the colon. There is small bowel dilation similar to the earlier study.  Nasogastric tube tip projects in the mid stomach.  IMPRESSION: 1. Contrast as extended into the colon.  Colon is nondistended. 2. There is persistent small bowel dilation similar to the earlier study. Findings are consistent with a partial small bowel obstruction.   Electronically Signed   By: Lajean Manes M.D.   On: 11/18/2014 18:59   Dg Abd Portable 1v-small  Bowel Obstruction Protocol-initial, 8 Hr Delay  11/18/2014   CLINICAL DATA:  Abdominal pain and distension, small bowel obstruction on recent CT and plain film examination.  EXAM: PORTABLE ABDOMEN - 1 VIEW  COMPARISON:  11/17/2014  FINDINGS: Nasogastric catheter remains in the stomach. Scattered large and small bowel gas is noted. Small bowel is dilated to 5.2 cm. The overall appearance is similar to that seen on the prior exam. This likely represents a partial small bowel obstruction given the degree of colonic gas.  IMPRESSION: Persistent small bowel obstruction with dilatation of the small bowel to 5.2 cm.   Electronically Signed   By: Inez Catalina M.D.   On: 11/18/2014 10:09     Medical Consultants:    Surgery: Armandina Gemma, MD  Cardiology: Josue Hector, MD  Anti-Infectives:    Anti-infectives    None      Subjective:   Leslie Frey denies abdominal pain, nausea.  Still has an NG tube in draining bilious fluid. No flatus or BMs.  No reports of palpitations or chest pain/discomfort.  Objective:    Filed Vitals:   11/22/14 0623 11/22/14 1500 11/22/14 2121 11/23/14 0536  BP: 135/74 133/74 129/54 134/71  Pulse: 74 84 46 79  Temp: 98.2 F (36.8 C) 98.3 F (36.8 C) 99 F (37.2 C) 98.6 F (37 C)  TempSrc: Oral Oral Oral Oral  Resp: 18 18 20 20   Height:      Weight:      SpO2: 97% 99% 99% 98%    Intake/Output Summary (Last 24 hours) at 11/23/14 0736 Last data filed at 11/23/14 5726  Gross per 24 hour  Intake      0 ml  Output   2425 ml  Net  -2425 ml   Filed Weights   11/16/14 1348  Weight: 62.596 kg (138 lb)    Exam: Gen:  NAD Cardiovascular:  RRR, No M/R/G Respiratory:  Lungs CTAB Gastrointestinal:  Abdomen softly distended, NT,  + BS Extremities:  No C/E/C   Data Reviewed:    Labs: Basic Metabolic Panel:  Recent Labs Lab 11/18/14 0510 11/19/14 0540 11/21/14 1136 11/22/14 0540 11/23/14 0610  NA 146* 144 138 139 137  K 3.2* 3.8 4.4 4.2 4.0  CL 111 109 107 106 102  CO2 27 29 28 27 26   GLUCOSE 86 133* 134* 115* 121*  BUN 15 13 5* 7 5*  CREATININE 0.79 0.83 0.85 0.81 0.82  CALCIUM 8.6* 8.5* 8.5* 8.7* 9.1  MG 1.6* 2.1  --   --   --    GFR Estimated Creatinine Clearance: 45.2 mL/min (by C-G formula based on Cr of 0.82). Liver Function Tests: No results for input(s): AST, ALT, ALKPHOS, BILITOT, PROT, ALBUMIN in the last 168 hours. No results for input(s): LIPASE, AMYLASE in the last 168 hours. Coagulation profile  Recent Labs Lab 11/17/14 0905  INR 1.07    CBC:  Recent Labs Lab 11/17/14 0905 11/18/14 0510 11/19/14 0540 11/21/14 1136 11/22/14 0540  WBC 10.0 7.1 8.3 10.4 7.1  HGB 12.5 11.2* 12.0 11.3* 11.1*  HCT 37.9 34.8* 35.9* 34.3* 33.1*  MCV 93.8 95.6 94.7 93.0 93.2  PLT 133* 125* 125* 140* 144*    Sepsis Labs:  Recent Labs Lab 11/18/14 0510 11/19/14 0540 11/21/14 1136 11/22/14 0540  WBC 7.1 8.3 10.4 7.1   Microbiology Recent Results (from the past 240 hour(s))  Culture, Urine     Status: None   Collection Time: 11/16/14 11:57 AM  Result Value  Ref Range Status   Specimen Description URINE, RANDOM  Final   Special Requests NONE  Final   Culture   Final    MULTIPLE SPECIES PRESENT, SUGGEST RECOLLECTION Performed at [facility]    Report Status 11/17/2014 FINAL  Final     Medications:   . antiseptic oral rinse  7 mL Mouth Rinse q12n4p  . bisacodyl  10 mg Rectal BID  . brimonidine  1 drop Both Eyes BID   And  . timolol  1 drop Both Eyes BID  . chlorhexidine  15 mL Mouth Rinse BID  . gabapentin  300 mg Oral BID  . latanoprost  1 drop Both Eyes QHS  . lubiprostone  24 mcg Oral BID WC  . metoprolol  2.5 mg Intravenous Q12H  . pantoprazole (PROTONIX) IV  40 mg Intravenous Daily  . traZODone  50 mg Oral QHS   Continuous Infusions: . dextrose 5 % and 0.45 % NaCl with KCl 40 mEq/L 75 mL/hr at 11/22/14 2130    Time spent: 25 minutes.   LOS: 8 days   [facility] Pager [phone]. If unable to reach me by pager, please call my cell phone at [phone].  *Please refer to [email or web address], password [email or web address] to get updated schedule on who will round on this patient, as hospitalists switch teams weekly. If 7PM-7AM, please contact night-coverage at [email or web address], password [email or web address] for any overnight needs.  11/23/2014, 7:36 AM

## 2014-11-23 NOTE — Op Note (Signed)
11/15/2014 - 11/23/2014  2:19 PM  PATIENT:  Leslie Frey  81 y.o. female  No care team member to display  PRE-OPERATIVE DIAGNOSIS:  SMALL BOWEL OBSTRUCTION  POST-OPERATIVE DIAGNOSIS:  SMALL BOWEL OBSTRUCTION  PROCEDURE:  DIAGNOSTIC LAPAROSCOPY LAPAROSCOPIC LYSIS OF ADHESIONS   Surgeon(s): Leighton Ruff, MD  ASSISTANT: none   ANESTHESIA:   local and general  EBL: 20ml Total I/O In: 1250 [I.V.:1250] Out: 10 [Urine:10]  DRAINS: none   SPECIMEN:  No Specimen  DISPOSITION OF SPECIMEN:  N/A  COUNTS:  YES  PLAN OF CARE: patient already admitted  PATIENT DISPOSITION:  PACU - hemodynamically stable.  INDICATION: 81 y.o. F with persistent partial bowel obstruction.  She did not fully resolve with NG decompression.  I recommended surgery.     OR FINDINGS: adhesions to midline wound causing partial obstruction  DESCRIPTION: the patient was identified in the preoperative holding area and taken to the OR where they were laid supine on the operating room table.  General anesthesia was induced without difficulty. SCDs were also noted to be in place prior to the initiation of anesthesia.  The patient was then prepped and draped in the usual sterile fashion.   A surgical timeout was performed indicating the correct patient, procedure, positioning and need for preoperative antibiotics.   An infraumbilical incision was made using a scalpel. Dissection was carried down to the level of the fascia. The fascia was elevated with 2 Kocher clamps and incised at midline. A Hassan port was placed and secured. The abdomen was insufflated to approximately 15 mmHg. I evaluated the abdomen completely. 2 more 5 mm ports were placed in the patient's left upper and lower quadrants under direct visualization. The omentum was adherent to the upper abdominal wall. There was 3 loops of bowel that were adherent to the previous midline incision. This was taken down using laparoscopic scissors.  I took down the  omentum partially from the left upper quadrant abdominal wall to evaluate NG placement. NG appeared to be within the stomach after manipulation.  I ran the bowel from the ileocecal valve to the ligament of Treitz. There were no other adhesions noted.  I desufflated the abdomen and enlarged the umbilical incision slightly. A brought out the 3 loops of bowel that were contained within the adhesions to the abdominal wall. I separated these loops of bowel using Metzenbaum scissors. I inspected them closely for any injuries. None were identified. I placed this back into the abdomen and irrigated the abdomen with normal saline. I placed a piece of Seprafilm over the pieces of bowel sitting near the incision site. I then closed the peritoneum using a 2-0 Vicryl suture. The fascia was closed using 2 #1 Novafil sutures. The subcutaneous tissue was closed using interrupted 2-0 Vicryl sutures. The skin was closed using a running 4-0 subcuticular suture and Dermabond.  I reinsufflated the abdomen and evaluated this completely. Hemostasis was good. The abdomen was desufflated and the 5 mm ports were removed. The port sites were closed using interrupted 4-0 Vicryl suture. All counts were correct per operating room staff. The patient was awakened from anesthesia and sent to the post anesthesia care unit in stable condition.

## 2014-11-23 NOTE — Anesthesia Procedure Notes (Signed)
Procedure Name: Intubation Date/Time: 11/23/2014 12:55 PM Performed by: Glory Buff Pre-anesthesia Checklist: Patient identified, Emergency Drugs available, Suction available and Patient being monitored Patient Re-evaluated:Patient Re-evaluated prior to inductionOxygen Delivery Method: Circle System Utilized Preoxygenation: Pre-oxygenation with 100% oxygen Intubation Type: IV induction Ventilation: Mask ventilation without difficulty Laryngoscope Size: Miller and 3 Grade View: Grade I Tube type: Oral Tube size: 7.0 mm Number of attempts: 1 Airway Equipment and Method: Stylet and Oral airway Placement Confirmation: ETT inserted through vocal cords under direct vision,  positive ETCO2 and breath sounds checked- equal and bilateral Secured at: 21 cm Tube secured with: Tape Dental Injury: Teeth and Oropharynx as per pre-operative assessment

## 2014-11-23 NOTE — Plan of Care (Signed)
Problem: Consults Goal: General Surgical Patient Education (See Patient Education module for education specifics)  Outcome: Progressing s/p ex lap with LOA

## 2014-11-23 NOTE — Progress Notes (Signed)
PARENTERAL NUTRITION CONSULT NOTE - INITIAL  Pharmacy Consult for TPN Indication: small bowel obstruction  No Known Allergies  Patient Measurements: Height: 5' (152.4 cm) Weight: 138 lb (62.596 kg) IBW/kg (Calculated) : 45.5  Adjusted body weight: 51kg  Vital Signs: Temp: 98.6 F (37 C) (09/27 1128) Temp Source: Oral (09/27 1128) BP: 126/76 mmHg (09/27 1128) Pulse Rate: 81 (09/27 1128) Intake/Output from previous day: 09/26 0701 - 09/27 0700 In: -  Out: 2425 [Urine:1075; Emesis/NG output:1350] Intake/Output from this shift:    Labs:  Recent Labs  11/21/14 1136 11/22/14 0540  WBC 10.4 7.1  HGB 11.3* 11.1*  HCT 34.3* 33.1*  PLT 140* 144*     Recent Labs  11/21/14 1136 11/22/14 0540 11/23/14 0610  NA 138 139 137  K 4.4 4.2 4.0  CL 107 106 102  CO2 28 27 26   GLUCOSE 134* 115* 121*  BUN 5* 7 5*  CREATININE 0.85 0.81 0.82  CALCIUM 8.5* 8.7* 9.1   Estimated Creatinine Clearance: 45.2 mL/min (by C-G formula based on Cr of 0.82).   No results for input(s): GLUCAP in the last 72 hours.  Medical History: Past Medical History  Diagnosis Date  . Hiatal hernia   . Hypertension   . Heart disease   . Hypercholesterolemia   . Arthritis   . IBS (irritable bowel syndrome)   . GERD (gastroesophageal reflux disease)     Medications:  Scheduled:  . [facility] Hold] antiseptic oral rinse  7 mL Mouth Rinse q12n4p  . [MAR Hold] brimonidine  1 drop Both Eyes BID   And  . [MAR Hold] timolol  1 drop Both Eyes BID  . cefTRIAXone (ROCEPHIN)  IV  2 g Intravenous On Call to OR  . [MAR Hold] chlorhexidine  15 mL Mouth Rinse BID  . [MAR Hold] gabapentin  300 mg Oral BID  . [MAR Hold] latanoprost  1 drop Both Eyes QHS  . [MAR Hold] lubiprostone  24 mcg Oral BID WC  . [MAR Hold] metoprolol  2.5 mg Intravenous 4 times per day  . [MAR Hold] pantoprazole (PROTONIX) IV  40 mg Intravenous Daily  . [MAR Hold] traZODone  50 mg Oral QHS   Infusions:  . dextrose 5 % and 0.45 % NaCl  with KCl 40 mEq/L 75 mL/hr at 11/22/14 2130   Insulin Requirements: none  Current Nutrition: none  IVF: D51/2 40K at 40ml/hr  Central access: PICC TPN start date: 9/27  ASSESSMENT                                                                                                          81yo F admitted w/ N/V and abd pain. CT revealed SBO. She failed conservative management. Surgery is planning diagnostic laparoscopy today with possible colectomy/LOA. Pharmacy is asked to start TPN.  Significant events:   Today:   Glucose - at goal <150   Electrolytes - wnl   Renal - SCr wnl  LFTs - no result  TGs - no result  Prealbumin - no result  NUTRITIONAL GOALS  RD recs: 1300-1500 Kcal/d and 50-60g/d protein, 2-2.2L/d fluid. Clinimix E 5/15 at a goal rate of 23ml/hr + 20% fat emulsion at 62ml/hr to provide: 72g/day protein, 1502Kcal/day.  PLAN                                                                                                                         At 1800 today:  Start Clinimix E 5/15 at 13ml/hr.  20% fat emulsion at 31ml/hr.  Plan to advance as tolerated to the goal rate.  TPN to contain standard multivitamins and trace elements.  Reduce IVF to 13ml/hr.  Add Sens SSI q4h.   TPN lab panels on Mondays & Thursdays.  F/u daily.  Romeo Rabon, PharmD, pager [phone]. 11/23/2014,12:54 PM.

## 2014-11-23 NOTE — Transfer of Care (Signed)
Immediate Anesthesia Transfer of Care Note  Patient: Leslie Frey  Procedure(s) Performed: Procedure(s): DIAGNOSTIC LAPAROSCOPY (N/A) LAPAROSCOPIC LYSIS OF ADHESIONS  Patient Location: PACU  Anesthesia Type:General  Level of Consciousness: awake, alert  and oriented  Airway & Oxygen Therapy: Patient Spontanous Breathing and Patient connected to face mask oxygen  Post-op Assessment: Report given to RN and Post -op Vital signs reviewed and stable  Post vital signs: Reviewed and stable  Last Vitals:  Filed Vitals:   11/23/14 1128  BP: 126/76  Pulse: 81  Temp: 37 C  Resp: 18    Complications: No apparent anesthesia complications

## 2014-11-23 NOTE — Progress Notes (Signed)
CM called to notify that pt has been having bigeminy and trigeminy heart beats throughout the night.  Pt is resting comfortably with no distress noted. MD has been notified and will continue to monitor pt closely.

## 2014-11-23 NOTE — Progress Notes (Signed)
Patient ID: Leslie Frey, female   DOB: 1933/01/11, 81 y.o.   MRN: [id number] 4 Days Post-Op  Subjective: Pt feels well today.  Small BM with suppository yesterday she thinks.  No abdominal pain.  Drinking lots of water  Objective: Vital signs in last 24 hours: Temp:  [98.3 F (36.8 C)-99 F (37.2 C)] 98.6 F (37 C) (09/27 0536) Pulse Rate:  [46-84] 79 (09/27 0536) Resp:  [18-20] 20 (09/27 0536) BP: (129-134)/(54-74) 134/71 mmHg (09/27 0536) SpO2:  [98 %-99 %] 98 % (09/27 0536) Last BM Date: Dec 08, 2014  Intake/Output from previous day: 09/26 0701 - 09/27 0700 In: -  Out: 2425 [Urine:1075; Emesis/NG output:1350] Intake/Output this shift:    PE: Abd: soft, NT, ND, +BS, NGT with some watered down brown output Heart: regular Lungs: CTAB  Lab Results:   Recent Labs  08-Dec-2014 1136 11/22/14 0540  WBC 10.4 7.1  HGB 11.3* 11.1*  HCT 34.3* 33.1*  PLT 140* 144*   BMET  Recent Labs  11/22/14 0540 11/23/14 0610  NA 139 137  K 4.2 4.0  CL 106 102  CO2 27 26  GLUCOSE 115* 121*  BUN 7 5*  CREATININE 0.81 0.82  CALCIUM 8.7* 9.1   PT/INR No results for input(s): LABPROT, INR in the last 72 hours. CMP     Component Value Date/Time   NA 137 11/23/2014 0610   K 4.0 11/23/2014 0610   CL 102 11/23/2014 0610   CO2 26 11/23/2014 0610   GLUCOSE 121* 11/23/2014 0610   BUN 5* 11/23/2014 0610   CREATININE 0.82 11/23/2014 0610   CALCIUM 9.1 11/23/2014 0610   PROT 7.8 11/15/2014 1504   ALBUMIN 4.8 11/15/2014 1504   AST 31 11/15/2014 1504   ALT 17 11/15/2014 1504   ALKPHOS 68 11/15/2014 1504   BILITOT 0.6 11/15/2014 1504   GFRNONAA >60 11/23/2014 0610   GFRAA >60 11/23/2014 0610   Lipase     Component Value Date/Time   LIPASE 27 11/15/2014 1504       Studies/Results: Dg Abd 2 Views  December 08, 2014   CLINICAL DATA:  Abdominal distention this morning. Follow-up small bowel obstruction.  EXAM: ABDOMEN - 2 VIEW  COMPARISON:  11/20/2014  FINDINGS: Degree of small bowel  distention decreased since prior study. Mildly prominent sigmoid colon loops in the lower abdomen and upper pelvis. No free air. No organomegaly. Oral contrast material noted within the right colon and transverse colon.  IMPRESSION: Improving small bowel distention. Mild gaseous distention of the colon currently.   Electronically Signed   By: Rolm Baptise M.D.   On: 08-Dec-2014 08:46    Anti-infectives: Anti-infectives    None       Assessment/Plan  1. pSBO -check films today -hopefully can DC NGT.  Suspect high output is secondary to many cups of water she is drinking and not secondary to obstruction. -mobilize and pulm toilet   ADDENDUM: x-rays show no significant improvement.  I have discussed the situation with Dr. Marcello Moores as well as the family.  They would like to proceed with surgery today.  Will plan on dx lap possible laparotomy with LOA.  I have discussed the procedure including expected outcomes and risks and complications.  They are both (daughter and patient) agreeable to proceed.  All questions answered.  LOS: 8 days    OSBORNE,KELLY E 11/23/2014, 8:37 AM Pager: [phone]

## 2014-11-23 NOTE — Progress Notes (Signed)
Peripherally Inserted Central Catheter/Midline Placement  The IV Nurse has discussed with the patient and/or persons authorized to consent for the patient, the purpose of this procedure and the potential benefits and risks involved with this procedure.  The benefits include less needle sticks, lab draws from the catheter and patient may be discharged home with the catheter.  Risks include, but not limited to, infection, bleeding, blood clot (thrombus formation), and puncture of an artery; nerve damage and irregular heat beat.  Alternatives to this procedure were also discussed.  PICC/Midline Placement Documentation        Cothren, Nicolette Bang 11/23/2014, 5:30 PM

## 2014-11-24 ENCOUNTER — Inpatient Hospital Stay (HOSPITAL_COMMUNITY): Payer: Medicare Other

## 2014-11-24 DIAGNOSIS — D649 Anemia, unspecified: Secondary | ICD-10-CM

## 2014-11-24 DIAGNOSIS — E876 Hypokalemia: Secondary | ICD-10-CM

## 2014-11-24 DIAGNOSIS — D696 Thrombocytopenia, unspecified: Secondary | ICD-10-CM

## 2014-11-24 DIAGNOSIS — I499 Cardiac arrhythmia, unspecified: Secondary | ICD-10-CM

## 2014-11-24 DIAGNOSIS — R1110 Vomiting, unspecified: Secondary | ICD-10-CM

## 2014-11-24 LAB — GLUCOSE, CAPILLARY
GLUCOSE-CAPILLARY: 146 mg/dL — AB (ref 65–99)
GLUCOSE-CAPILLARY: 138 mg/dL — AB (ref 65–99)
Glucose-Capillary: 164 mg/dL — ABNORMAL HIGH (ref 65–99)
Glucose-Capillary: 157 mg/dL — ABNORMAL HIGH (ref 65–99)
Glucose-Capillary: 112 mg/dL — ABNORMAL HIGH (ref 65–99)
Glucose-Capillary: 129 mg/dL — ABNORMAL HIGH (ref 65–99)

## 2014-11-24 LAB — MAGNESIUM: Magnesium: 1.6 mg/dL — ABNORMAL LOW (ref 1.7–2.4)

## 2014-11-24 LAB — COMPREHENSIVE METABOLIC PANEL
ALBUMIN: 2.6 g/dL — AB (ref 3.5–5.0)
ALK PHOS: 46 U/L (ref 38–126)
ALT: 12 U/L — AB (ref 14–54)
AST: 20 U/L (ref 15–41)
Anion gap: 7 (ref 5–15)
BILIRUBIN TOTAL: 0.5 mg/dL (ref 0.3–1.2)
BUN: 11 mg/dL (ref 6–20)
CALCIUM: 8.5 mg/dL — AB (ref 8.9–10.3)
CO2: 26 mmol/L (ref 22–32)
Chloride: 101 mmol/L (ref 101–111)
Creatinine, Ser: 0.82 mg/dL (ref 0.44–1.00)
GFR calc Af Amer: >60 mL/min (ref >60)
GFR calc non Af Amer: >60 mL/min (ref >60)
GLUCOSE: 154 mg/dL — AB (ref 65–99)
Potassium: 4.6 mmol/L (ref 3.5–5.1)
SODIUM: 134 mmol/L — AB (ref 135–145)
TOTAL PROTEIN: 5.2 g/dL — AB (ref 6.5–8.1)

## 2014-11-24 LAB — CBC
HEMATOCRIT: 36.3 % (ref 36.0–46.0)
HEMOGLOBIN: 12.2 g/dL (ref 12.0–15.0)
MCH: 31.1 pg (ref 26.0–34.0)
MCHC: 33.6 g/dL (ref 30.0–36.0)
MCV: 92.6 fL (ref 78.0–100.0)
Platelets: 172 10*3/uL (ref 150–400)
RBC: 3.92 MIL/uL (ref 3.87–5.11)
RDW: 15.5 % (ref 11.5–15.5)
WBC: 9.4 10*3/uL (ref 4.0–10.5)

## 2014-11-24 LAB — DIFFERENTIAL
BASOS ABS: 0 10*3/uL (ref 0.0–0.1)
Basophils Relative: 0 %
EOS ABS: 0.1 10*3/uL (ref 0.0–0.7)
EOS PCT: 1 %
LYMPHS ABS: 1.4 10*3/uL (ref 0.7–4.0)
LYMPHS PCT: 15 %
MONOS PCT: 10 %
Monocytes Absolute: 0.9 10*3/uL (ref 0.1–1.0)
NEUTROS PCT: 74 %
Neutro Abs: 7 10*3/uL (ref 1.7–7.7)

## 2014-11-24 LAB — PREALBUMIN: Prealbumin: 9.1 mg/dL — ABNORMAL LOW (ref 18–38)

## 2014-11-24 LAB — TRIGLYCERIDES: Triglycerides: 93 mg/dL (ref <150)

## 2014-11-24 LAB — PHOSPHORUS: PHOSPHORUS: 3.3 mg/dL (ref 2.5–4.6)

## 2014-11-24 MED ORDER — MAGNESIUM SULFATE 2 GM/50ML IV SOLN
2.0000 g | Freq: Once | INTRAVENOUS | Status: AC
  Administered 2014-11-24: 2 g via INTRAVENOUS
  Filled 2014-11-24: qty 50

## 2014-11-24 MED ORDER — ENOXAPARIN SODIUM 40 MG/0.4ML ~~LOC~~ SOLN
40.0000 mg | SUBCUTANEOUS | Status: DC
  Administered 2014-11-24 – 2014-11-29 (×6): 40 mg via SUBCUTANEOUS
  Filled 2014-11-24 (×7): qty 0.4

## 2014-11-24 MED ORDER — GI COCKTAIL ~~LOC~~
30.0000 mL | Freq: Once | ORAL | Status: AC
  Administered 2014-11-24: 30 mL via ORAL
  Filled 2014-11-24 (×2): qty 30

## 2014-11-24 MED ORDER — KCL IN DEXTROSE-NACL 40-5-0.45 MEQ/L-%-% IV SOLN
INTRAVENOUS | Status: DC
  Administered 2014-11-24 – 2014-11-25 (×3): via INTRAVENOUS
  Filled 2014-11-24 (×4): qty 1000

## 2014-11-24 MED ORDER — FAT EMULSION 20 % IV EMUL
240.0000 mL | INTRAVENOUS | Status: AC
  Administered 2014-11-24: 240 mL via INTRAVENOUS
  Filled 2014-11-24: qty 250

## 2014-11-24 MED ORDER — TRACE MINERALS CR-CU-MN-SE-ZN 10-1000-500-60 MCG/ML IV SOLN
INTRAVENOUS | Status: AC
  Administered 2014-11-24: 17:00:00 via INTRAVENOUS
  Filled 2014-11-24: qty 1560

## 2014-11-24 MED ORDER — CALCIUM CARBONATE ANTACID 500 MG PO CHEW
400.0000 mg | CHEWABLE_TABLET | Freq: Three times a day (TID) | ORAL | Status: DC | PRN
  Administered 2014-11-24: 400 mg via ORAL
  Filled 2014-11-24: qty 2

## 2014-11-24 NOTE — Anesthesia Postprocedure Evaluation (Signed)
  Anesthesia Post-op Note  Patient: Leslie Frey  Procedure(s) Performed: Procedure(s): DIAGNOSTIC LAPAROSCOPY (N/A) LAPAROSCOPIC LYSIS OF ADHESIONS  Patient Location: PACU  Anesthesia Type:General  Level of Consciousness: awake  Airway and Oxygen Therapy: Patient Spontanous Breathing  Post-op Pain: mild  Post-op Assessment: Post-op Vital signs reviewed              Post-op Vital Signs: Reviewed  Last Vitals:  Filed Vitals:   11/24/14 1232  BP: 77/49  Pulse: 88  Temp: 37 C  Resp: 19    Complications: No apparent anesthesia complications

## 2014-11-24 NOTE — Progress Notes (Signed)
Patient's NG clamped at 0900 for 6 hours. Returned back to suction at 1500 for 2 hours, output 50 ml. NG tube d/c'd at 1700 per MD order. Patient tolerated well will continue plan of care.

## 2014-11-24 NOTE — Progress Notes (Addendum)
[facility] PROGRESS NOTE Interim History: 81 year-old with past medical history of essential hypertension who presents to the ED with a small bowel obstruction surgery was consulted she had been treated conservatively. Surgery was considering surgical intervention but a repeated abdominal x-ray on 11/22/2014 showed the contrast moving long. So NG tube was clamped and start her on diet which she did not tolerate an NG tube had to be restarted to intermittent suction within 24 hours.    SBO (small bowel obstruction) due to adhesion: - Patient was started on IV fluids NG tube was placed intermittent intermittent suction, CT scan of the abdomen was done on admission. - Surgery was consulted and recommended conservative management, she has been on bowel rest with NG tube for gastric decompression. - Gastrografin done 9/22 and contrast has reached the colon so for now per surgery clamp NGT and advance diet to clears.  - Last abdominal x ray 9/22 demonstrated contrast extended into the colon, persistent small bowel dilation similar to the earlier study. Findings consistent with a partial small bowelAppreciate surgery's assistance NG tube has been clamped.  Bigeminy/ Trigeminy: - Beta-blocker was increased yesterday. Likely due to #1.  Essential hypertension: - Continue metoprolol IV.  Hypomagnesemia/Hypokalemia: - Secondary to GI losses nasogastric tube output has decreased. Continue to monitor electrolytes.  Normocytic anemia - Mild drop likely dilutional continue to monitor.  Leukocytosis - Likely reactive.  Thrombocytopenia Resolved, patient on SCDs.  CAD (coronary artery disease), native coronary artery Asymptomatic, aspirin on hold.  Dyslipidemia: Resume statins once patient is able to take orals    Code Status: full Family Communication: daughter  Disposition Plan: Home once small bowel obstruction  resolved.  Consultants:  Surgery  Cardiology  Procedures:  CXR  Abd x-ray's  CT abd and pelvis  Antibiotics:  none  HPI/Subjective: Patient complaining of the surgical area is sore. She has not had a bowel movement or passing gas.   Objective: Filed Vitals:   11/23/14 2358 11/24/14 0409 11/24/14 0632 11/24/14 0813  BP: 110/61 110/54 102/61 116/62  Pulse: 60 50 50 48  Temp: 98.3 F (36.8 C) 98.1 F (36.7 C) 99 F (37.2 C) 99.7 F (37.6 C)  TempSrc: Oral Oral Oral Oral  Resp: 15 16 14 18   Height:      Weight:      SpO2: 99% 96% 96% 98%    Intake/Output Summary (Last 24 hours) at 11/24/14 0913 Last data filed at 11/24/14 0900  Gross per 24 hour  Intake 3138.34 ml  Output  1455 ml  Net 1683.34 ml   Filed Weights   11/16/14 1348  Weight: 62.596 kg (138 lb)    Exam:  General: Alert, awake, oriented x3, in no acute distress.  HEENT: No bruits, no goiter.  Heart: Regular rate and rhythm. Lungs: Good air movement, clear  Abdomen: Soft,tenderness diffuse, nondistended, positive bowel sounds.  Neuro: Grossly intact, nonfocal.   Data Reviewed: Basic Metabolic Panel:  Last Labs      Recent Labs Lab 11/18/14 0510 11/19/14 0540 11/21/14 1136 11/22/14 0540 11/23/14 0610 11/24/14 0540  NA 146* 144 138 139 137 134*  K 3.2* 3.8 4.4 4.2 4.0 4.6  CL 111 109 107 106 102 101  CO2 27 29 28 27 26 26   GLUCOSE 86 133* 134* 115* 121* 154*  BUN 15 13 5* 7 5* 11  CREATININE 0.79 0.83 0.85 0.81 0.82 0.82  CALCIUM 8.6* 8.5* 8.5* 8.7* 9.1 8.5*  MG 1.6* 2.1 --  --  --  1.6*  PHOS --  --  --  --  --  3.3     Liver Function Tests:  Last Labs      Recent Labs Lab 11/24/14 0540  AST 20  ALT 12*  ALKPHOS 46  BILITOT 0.5  PROT 5.2*  ALBUMIN 2.6*      Last Labs     No results for input(s):  LIPASE, AMYLASE in the last 168 hours.    Last Labs     No results for input(s): AMMONIA in the last 168 hours.   CBC:  Last Labs      Recent Labs Lab 11/18/14 0510 11/19/14 0540 11/21/14 1136 11/22/14 0540 11/24/14 0540  WBC 7.1 8.3 10.4 7.1 9.4  NEUTROABS --  --  --  --  7.0  HGB 11.2* 12.0 11.3* 11.1* 12.2  HCT 34.8* 35.9* 34.3* 33.1* 36.3  MCV 95.6 94.7 93.0 93.2 92.6  PLT 125* 125* 140* 144* 172     Cardiac Enzymes:  Last Labs     No results for input(s): CKTOTAL, CKMB, CKMBINDEX, TROPONINI in the last 168 hours.   BNP (last 3 results)  Recent Labs (within last 365 days)    No results for input(s): BNP in the last 8760 hours.    ProBNP (last 3 results)  Recent Labs (within last 365 days)    No results for input(s): PROBNP in the last 8760 hours.    CBG:  Last Labs      Recent Labs Lab 11/23/14 2013 11/23/14 2357 11/24/14 0409 11/24/14 0850  GLUCAP 183* 138* 164* 146*      Recent Results (from the past 240 hour(s))  Culture, Urine Status: None   Collection Time: 11/16/14 11:57 AM  Result Value Ref Range Status   Specimen Description URINE, RANDOM  Final   Special Requests NONE  Final   Culture   Final    MULTIPLE SPECIES PRESENT, SUGGEST RECOLLECTION Performed at [facility]    Report Status 11/17/2014 FINAL  Final     Studies:  Imaging Results (Last 48 hours)    Dg Abd 2 Views  11/23/2014 CLINICAL DATA: Mid abdominal pain. Small-bowel obstruction. EXAM: ABDOMEN - 2 VIEW COMPARISON: Plain films of the abdomen 11/18/2014, 11/20/2014 and 11/21/2014. FINDINGS: No free intraperitoneal air is identified. Contrast material in the colon seen on the prior examination has progressed somewhat. There is dilatation of small bowel with loops measuring up to 4.0 cm. Gaseous distention of small bowel has improved since yesterday's examination. NG tube is  in place. IMPRESSION: Continued improvement in small bowel obstruction. No new abnormality. Electronically Signed By: Inge Rise M.D. On: 11/23/2014 10:25     Scheduled Meds: . antiseptic oral rinse 7 mL Mouth Rinse q12n4p  . brimonidine 1 drop Both Eyes BID   And  . timolol 1 drop Both Eyes BID  . chlorhexidine 15 mL Mouth Rinse BID  . enoxaparin (LOVENOX) injection 40 mg Subcutaneous Q24H  . gabapentin 300 mg Oral BID  . insulin aspart 0-9 Units Subcutaneous 6 times per day  . latanoprost 1 drop Both Eyes QHS  . lubiprostone 24 mcg Oral BID WC  . metoprolol 2.5 mg Intravenous 4 times per day  . pantoprazole (PROTONIX) IV 40 mg Intravenous Daily  . traZODone 50 mg Oral QHS   Continuous Infusions: . dextrose 5 % and 0.45 % NaCl with KCl 40 mEq/L 75 mL/hr at 11/24/14 0409  . Marland KitchenTPN (CLINIMIX-E) Adult 40 mL/hr at 11/23/14 1756   And  .  fat emulsion 250 mL (11/23/14 1756)    Time Spent: 25 min   FELIZ Marguarite Arbour [facility] Pager [phone].  If 7PM-7AM, please contact night-coverage at [email or web address], password [facility] 11/24/2014, 9:13 AM  LOS: 9 days

## 2014-11-24 NOTE — Progress Notes (Signed)
Patient ID: Leslie Frey, female   DOB: 1933-02-19, 81 y.o.   MRN: [id number] 1 Day Post-Op  Subjective: Pt having some pain, but mostly c/o pain in her throat from NGT.  No flatus yet  Objective: Vital signs in last 24 hours: Temp:  [97.4 F (36.3 C)-99.7 F (37.6 C)] 99.7 F (37.6 C) (09/28 0813) Pulse Rate:  [46-90] 48 (09/28 0813) Resp:  [12-18] 18 (09/28 0813) BP: (102-173)/(45-111) 116/62 mmHg (09/28 0813) SpO2:  [96 %-100 %] 98 % (09/28 0813) Last BM Date: 11/22/14  Intake/Output from previous day: December 10, 2022 0701 - 09/28 0700 In: 3138.3 [I.V.:2525; NG/GT:30; IV Piggyback:50; TPN:533.3] Out: 880 [Urine:785; Emesis/NG output:85; Blood:10] Intake/Output this shift:    PE: Abd: soft, appropriately tender, +BS, NGT with minimal output documented.  About 400cc in cannister right now, but mostly watered down output from ice chips.  Incisions are c/d/i HEart: irregular, unclear whether she is irregularly irregular or having a lot of ectopic beats Lungs: CTAB  Lab Results:   Recent Labs  11/22/14 0540 11/24/14 0540  WBC 7.1 9.4  HGB 11.1* 12.2  HCT 33.1* 36.3  PLT 144* 172   BMET  Recent Labs  2014-12-10 0610 11/24/14 0540  NA 137 134*  K 4.0 4.6  CL 102 101  CO2 26 26  GLUCOSE 121* 154*  BUN 5* 11  CREATININE 0.82 0.82  CALCIUM 9.1 8.5*   PT/INR No results for input(s): LABPROT, INR in the last 72 hours. CMP     Component Value Date/Time   NA 134* 11/24/2014 0540   K 4.6 11/24/2014 0540   CL 101 11/24/2014 0540   CO2 26 11/24/2014 0540   GLUCOSE 154* 11/24/2014 0540   BUN 11 11/24/2014 0540   CREATININE 0.82 11/24/2014 0540   CALCIUM 8.5* 11/24/2014 0540   PROT 5.2* 11/24/2014 0540   ALBUMIN 2.6* 11/24/2014 0540   AST 20 11/24/2014 0540   ALT 12* 11/24/2014 0540   ALKPHOS 46 11/24/2014 0540   BILITOT 0.5 11/24/2014 0540   GFRNONAA >60 11/24/2014 0540   GFRAA >60 11/24/2014 0540   Lipase     Component Value Date/Time   LIPASE 27 11/15/2014 1504        Studies/Results: Dg Abd 2 Views  10-Dec-2014   CLINICAL DATA:  Mid abdominal pain.  Small-bowel obstruction.  EXAM: ABDOMEN - 2 VIEW  COMPARISON:  Plain films of the abdomen 11/18/2014, 11/20/2014 and 11/21/2014.  FINDINGS: No free intraperitoneal air is identified. Contrast material in the colon seen on the prior examination has progressed somewhat. There is dilatation of small bowel with loops measuring up to 4.0 cm. Gaseous distention of small bowel has improved since yesterday's examination. NG tube is in place.  IMPRESSION: Continued improvement in small bowel obstruction. No new abnormality.   Electronically Signed   By: Inge Rise M.D.   On: December 10, 2014 10:25    Anti-infectives: Anti-infectives    Start     Dose/Rate Route Frequency Ordered Stop   10-Dec-2014 1100  cefTRIAXone (ROCEPHIN) 2 g in dextrose 5 % 50 mL IVPB     2 g 100 mL/hr over 30 Minutes Intravenous On call to O.R. December 10, 2014 1059 2014-12-10 1258       Assessment/Plan  POD 1, s/p laparoscopic LOA with mini-laparotomy, Dr. Marcello Moores -doing quite well today.  Minimal NGT output and output she does have is mostly watered down -will clamp NGT.  If she does well with minimal residual will DC NGT later today -cont NPO even if  NGT comes out -DC foley -PT eval and treatment for post op mobilization -IS -IV pain meds for now Irregular heart beat -EKG to determine if she is in new onset a fib vs a lot of ectopic beats.  DVT prophylaxis -SCDs/start lovenox today  LOS: 9 days    Leslie Frey E 11/24/2014, 8:44 AM Pager: [phone]

## 2014-11-24 NOTE — Progress Notes (Signed)
Physical Therapy Treatment Patient Details Name: Leslie Frey MRN: [id number] DOB: 05/29/1932 Today's Date: 11/24/2014    History of Present Illness 81 yo female admitted with SBO. Hx of HTn, hiatal hernia, MI    PT Comments    Pt sitting in recliner upright with NG tube clamped and granddaughter in room.  Assisted with amb a greater distance in hallway.  Used a RW just for safety/comfort and recent ABD surgery.     Follow Up Recommendations  No PT follow up;Supervision - Intermittent     Equipment Recommendations  None recommended by PT    Recommendations for Other Services       Precautions / Restrictions Precautions Precautions: Fall Precaution Comments: NPO   NG tube(clamped) Restrictions Weight Bearing Restrictions: No    Mobility  Bed Mobility               General bed mobility comments: oob in recliner  Transfers Overall transfer level: Needs assistance   Transfers: Sit to/from Stand Sit to Stand: Supervision;Min guard         General transfer comment: close guard for safety  Ambulation/Gait Ambulation/Gait assistance: Min guard;Supervision Ambulation Distance (Feet): 155 Feet Assistive device: Rolling walker (2 wheeled) Gait Pattern/deviations: Step-through pattern;Decreased stride length;Trunk flexed     General Gait Details: used RW just for safety and recent ABD surgery.  Slow.    Stairs            Wheelchair Mobility    Modified Rankin (Stroke Patients Only)       Balance                                    Cognition Arousal/Alertness: Awake/alert Behavior During Therapy: WFL for tasks assessed/performed Overall Cognitive Status: Within Functional Limits for tasks assessed                      Exercises      General Comments        Pertinent Vitals/Pain Pain Assessment: Faces Faces Pain Scale: Hurts little more Pain Location: ABD Pain Descriptors / Indicators: Cramping Pain  Intervention(s): Monitored during session;Repositioned    Home Living                      Prior Function            PT Goals (current goals can now be found in the care plan section) Progress towards PT goals: Progressing toward goals    Frequency  Min 3X/week    PT Plan      Co-evaluation             End of Session Equipment Utilized During Treatment: Gait belt Activity Tolerance: Patient tolerated treatment well Patient left: in chair;with call bell/phone within reach;with family/visitor present     Time: 1520-1540 PT Time Calculation (min) (ACUTE ONLY): 20 min  Charges:  $Gait Training: 8-22 mins                    G Codes:      Rica Koyanagi  PTA [facility]  Acute  Rehab Pager      [phone]

## 2014-11-24 NOTE — Progress Notes (Signed)
PARENTERAL NUTRITION CONSULT NOTE  Pharmacy Consult for TPN Indication: small bowel obstruction  No Known Allergies  Patient Measurements: Height: 5' (152.4 cm) Weight: 138 lb (62.596 kg) IBW/kg (Calculated) : 45.5  Adjusted body weight: 51kg  Vital Signs: Temp: 99.7 F (37.6 C) (09/28 0813) Temp Source: Oral (09/28 0813) BP: 116/62 mmHg (09/28 0813) Pulse Rate: 48 (09/28 0813) Intake/Output from previous day: 09/27 0701 - 09/28 0700 In: 3138.3 [I.V.:2525; NG/GT:30; IV Piggyback:50; TPN:533.3] Out: 880 [Urine:785; Emesis/NG output:85; Blood:10] Intake/Output from this shift: Total I/O In: -  Out: 725 [Urine:225; Emesis/NG output:500]  Labs:  Recent Labs  11/21/14 1136 11/22/14 0540 11/24/14 0540  WBC 10.4 7.1 9.4  HGB 11.3* 11.1* 12.2  HCT 34.3* 33.1* 36.3  PLT 140* 144* 172     Recent Labs  11/22/14 0540 11/23/14 0610 11/24/14 0540  NA 139 137 134*  K 4.2 4.0 4.6  CL 106 102 101  CO2 27 26 26   GLUCOSE 115* 121* 154*  BUN 7 5* 11  CREATININE 0.81 0.82 0.82  CALCIUM 8.7* 9.1 8.5*  MG  --   --  1.6*  PHOS  --   --  3.3  PROT  --   --  5.2*  ALBUMIN  --   --  2.6*  AST  --   --  20  ALT  --   --  12*  ALKPHOS  --   --  46  BILITOT  --   --  0.5  PREALBUMIN  --   --  9.1*  TRIG  --   --  93   Estimated Creatinine Clearance: 45.2 mL/min (by C-G formula based on Cr of 0.82).    Recent Labs  11/23/14 2357 11/24/14 0409 11/24/14 0850  GLUCAP 138* 164* 146*    Medical History: Past Medical History  Diagnosis Date  . Hiatal hernia   . Hypertension   . Heart disease   . Hypercholesterolemia   . Arthritis   . IBS (irritable bowel syndrome)   . GERD (gastroesophageal reflux disease)     Medications:  Scheduled:  . antiseptic oral rinse  7 mL Mouth Rinse q12n4p  . brimonidine  1 drop Both Eyes BID   And  . timolol  1 drop Both Eyes BID  . chlorhexidine  15 mL Mouth Rinse BID  . enoxaparin (LOVENOX) injection  40 mg Subcutaneous Q24H   . gabapentin  300 mg Oral BID  . insulin aspart  0-9 Units Subcutaneous 6 times per day  . latanoprost  1 drop Both Eyes QHS  . lubiprostone  24 mcg Oral BID WC  . metoprolol  2.5 mg Intravenous 4 times per day  . pantoprazole (PROTONIX) IV  40 mg Intravenous Daily  . traZODone  50 mg Oral QHS   Infusions:  . dextrose 5 % and 0.45 % NaCl with KCl 40 mEq/L 75 mL/hr at 11/24/14 0409  . Marland KitchenTPN (CLINIMIX-E) Adult 40 mL/hr at 11/23/14 1756   And  . fat emulsion 250 mL (11/23/14 1756)   Insulin Requirements: 3 units Novolog 9/27  Current Nutrition: none  IVF: D51/2 40K at 50ml/hr  Central access: PICC TPN start date: 9/27  ASSESSMENT  81yo F admitted w/ N/V and abd pain. CT revealed SBO. She failed conservative management. Now s/p LOA with laparotomy. Pharmacy is asked to start TPN.  Significant events:  9/28: To clamp NG tube today, will remain NPO.   Today:   Glucose - mostly at goal < 150; max 164  Electrolytes - Mg borderline low; otherwise wnl  Renal - SCr wnl; CrCl low d/t age  LFTs - wnl, albumin low  TGs - wnl  Prealbumin - 9.1 (9/28)  NUTRITIONAL GOALS                                                                                             RD recs: 1550-1750 Kcal/d and 70-80 g/d protein, 2-2.2 L/d fluid. Clinimix E 5/15 at a goal rate of 65 ml/hr + 20% fat emulsion at 10 ml/hr to provide: 78 g/day protein, 1588 Kcal/day.  PLAN                                                                                                                         At 1800 today:  Advance Clinimix E 5/15 to 65 ml/hr.  Continue 20% fat emulsion at 10 ml/hr.  TPN to contain standard multivitamins and trace elements.  Mg 2g IV x 1  IVF per MD/surgical management  Continue sens SSI q4h.   TPN lab panels on Mondays & Thursdays.  F/u daily.  Reuel Boom, PharmD,  BCPS Pager: [phone] 11/24/2014, 10:20 AM

## 2014-11-24 NOTE — Progress Notes (Signed)
Nutrition Follow-up  DOCUMENTATION CODES:   Not applicable  INTERVENTION:  - TPN per pharmacy - RD will continue to monitor for needs  NUTRITION DIAGNOSIS:   Inadequate oral intake related to inability to eat as evidenced by NPO status. -ongoing  GOAL:   Patient will meet greater than or equal to 90% of their needs -unmet at this time  MONITOR:   Weight trends, Labs, Skin, I & O's, Other (Comment) (TPN regimen)  REASON FOR ASSESSMENT:   Consult New TPN/TNA  ASSESSMENT:   81 y.o. female with h/o HTN, HLD, hiatal hernia. Patient presents to ED with epigastric pain and vomiting. Vomiting has been non-stop since last evening. Has been having trouble with constipation over last week or 2 and has been many days since last BM. Pain is moderate in intensity, located in epigastric area, non-radiating.  9/28 New consult for TPN received yesterday while pt was in OR. NGT remains in place with 500cc medium brown drainage at time of RD visit. Pt POD #1ex laparoscopic LOA and mini-laparotomy. Pt reports abdominal pain this AM.  She is currently receiving Clinimix E 5/15 @ 40 mL/hr with 20% lipids @ 10 mL/hr which is providing 48 grams protein and 1162 kcal which is not meeting needs. Needs updated based on s/p surgery.   Per pharmacy note yesterday AM: At 1800 today:  Start Clinimix E 5/15 at 83ml/hr.  20% fat emulsion at 19ml/hr.  Plan to advance as tolerated to the goal rate.  TPN to contain standard multivitamins and trace elements.  Reduce IVF to 37ml/hr.  Add Sens SSI q4h.   Plan for Clinimix E 5/15 @ 60 mL/hr with 20% lipids @ 10 mL/hr which will provide 72 grams protein, 1502 kcal (this will meet 100% protein and 97% minimum estimated kcal needs)  Medication reviewed. Labs reviewed; CBGs: 138-183 mg/dL, Na: 134 mmol/L, Ca: 8.5 mg/dL.    9/26 - Diet advanced to CLD on 9/23 with no documented intakes. Diet again NPO as of 9/24 @ 0953.  -Pt has been NPO since  admission other than 9/23 (6 days total). - Pt states that she is feeling nauseated this AM and as if she is going to vomit. - Per surgery note this AM: She likely could have her NGT clamped and try some sips of liquids, but the daughter and nurse state that the patient chokes even with just ice chips with the NGT in place, so this is not safe. - ~100cc tan-colored drainage in canister at time of visit; pt and daughter state that canister was recently changed.   9/20 - Pt has been NPO since admission with NGT to suction placed last night.  - Wall canister full at time of RD visit.  - Pt denies abdominal pain after Morphine given recently and denies nausea.  - She states PTA she had abdominal pain x1.5 days with no associated nausea or vomiting.  - Pt's daughter at bedside reports pt began living with her 1 month ago.  - She states pt has never been a big eater and that she has not noticed any changes in appetite recently.  - Pt denies swallowing difficulty but states she has problems chewing meat and needs to "chew until it does not even taste good."  - No muscle or fat wasting present at this time.  - She states she had ice chips today which she tolerated well; will monitor for ability for diet to be advanced.  Diet Order:  Diet NPO time specified  TPN (CLINIMIX-E) Adult  Skin:  Reviewed, no issues  Last BM:  9/26  Height:   Ht Readings from Last 1 Encounters:  11/16/14 5' (1.524 m)    Weight:   Wt Readings from Last 1 Encounters:  11/16/14 138 lb (62.596 kg)    Ideal Body Weight:  45.45 kg (kg)  BMI:  Body mass index is 26.95 kg/(m^2).  Estimated Nutritional Needs:   Kcal:  1550-1750  Protein:  70-80 grams  Fluid:  2-2.2 L/day  EDUCATION NEEDS:   No education needs identified at this time     Jarome Matin, RD, LDN Inpatient Clinical Dietitian Pager # [phone] After hours/weekend pager # [phone]

## 2014-11-25 DIAGNOSIS — E785 Hyperlipidemia, unspecified: Secondary | ICD-10-CM

## 2014-11-25 LAB — COMPREHENSIVE METABOLIC PANEL
ALBUMIN: 2.4 g/dL — AB (ref 3.5–5.0)
ALK PHOS: 47 U/L (ref 38–126)
ALT: 10 U/L — AB (ref 14–54)
AST: 15 U/L (ref 15–41)
Anion gap: 3 — ABNORMAL LOW (ref 5–15)
BILIRUBIN TOTAL: 0.3 mg/dL (ref 0.3–1.2)
BUN: 19 mg/dL (ref 6–20)
CALCIUM: 7.9 mg/dL — AB (ref 8.9–10.3)
CO2: 25 mmol/L (ref 22–32)
CREATININE: 0.73 mg/dL (ref 0.44–1.00)
Chloride: 103 mmol/L (ref 101–111)
GFR calc Af Amer: >60 mL/min (ref >60)
GFR calc non Af Amer: >60 mL/min (ref >60)
GLUCOSE: 139 mg/dL — AB (ref 65–99)
POTASSIUM: 4.5 mmol/L (ref 3.5–5.1)
Sodium: 131 mmol/L — ABNORMAL LOW (ref 135–145)
TOTAL PROTEIN: 4.8 g/dL — AB (ref 6.5–8.1)

## 2014-11-25 LAB — GLUCOSE, CAPILLARY
GLUCOSE-CAPILLARY: 135 mg/dL — AB (ref 65–99)
GLUCOSE-CAPILLARY: 123 mg/dL — AB (ref 65–99)
GLUCOSE-CAPILLARY: 131 mg/dL — AB (ref 65–99)
Glucose-Capillary: 127 mg/dL — ABNORMAL HIGH (ref 65–99)
Glucose-Capillary: 158 mg/dL — ABNORMAL HIGH (ref 65–99)

## 2014-11-25 LAB — PHOSPHORUS: Phosphorus: 2.7 mg/dL (ref 2.5–4.6)

## 2014-11-25 LAB — MAGNESIUM: Magnesium: 2.1 mg/dL (ref 1.7–2.4)

## 2014-11-25 MED ORDER — SODIUM CHLORIDE 0.9 % IV SOLN: INTRAVENOUS | Status: DC

## 2014-11-25 MED ORDER — SIMVASTATIN 40 MG PO TABS
40.0000 mg | ORAL_TABLET | Freq: Every evening | ORAL | Status: DC
  Administered 2014-11-25 – 2014-11-29 (×5): 40 mg via ORAL
  Filled 2014-11-25 (×6): qty 1

## 2014-11-25 MED ORDER — PANTOPRAZOLE SODIUM 40 MG IV SOLR
40.0000 mg | Freq: Two times a day (BID) | INTRAVENOUS | Status: DC
  Administered 2014-11-25 – 2014-11-29 (×9): 40 mg via INTRAVENOUS
  Filled 2014-11-25 (×11): qty 40

## 2014-11-25 MED ORDER — METOPROLOL TARTRATE 1 MG/ML IV SOLN: 2.5000 mg | Freq: Four times a day (QID) | INTRAVENOUS | Status: DC | PRN

## 2014-11-25 MED ORDER — TRAZODONE HCL 50 MG PO TABS: 60.0000 mg | ORAL_TABLET | Freq: Every day | ORAL | Status: DC

## 2014-11-25 MED ORDER — FAT EMULSION 20 % IV EMUL
240.0000 mL | INTRAVENOUS | Status: AC
  Administered 2014-11-25: 240 mL via INTRAVENOUS
  Filled 2014-11-25: qty 250

## 2014-11-25 MED ORDER — TRACE MINERALS CR-CU-MN-SE-ZN 10-1000-500-60 MCG/ML IV SOLN
INTRAVENOUS | Status: AC
  Administered 2014-11-25: 18:00:00 via INTRAVENOUS
  Filled 2014-11-25: qty 1560

## 2014-11-25 NOTE — Care Management Important Message (Signed)
Important Message  Patient Details  Name: Leslie Frey MRN: [id number] Date of Birth: 1932/04/24   Medicare Important Message Given:  Yes-fourth notification given    Shelda Altes 11/25/2014, 3:42 [place] Message  Patient Details  Name: Leslie Frey MRN: [id number] Date of Birth: 1932/10/31   Medicare Important Message Given:  Yes-fourth notification given    Shelda Altes 11/25/2014, 3:42 PM

## 2014-11-25 NOTE — Progress Notes (Signed)
PARENTERAL NUTRITION CONSULT NOTE  Pharmacy Consult for TPN Indication: small bowel obstruction  No Known Allergies  Patient Measurements: Height: 5' (152.4 cm) Weight: 138 lb (62.596 kg) IBW/kg (Calculated) : 45.5  Adjusted body weight: 51kg  Vital Signs: Temp: 98.8 F (37.1 C) (09/29 0835) Temp Source: Oral (09/29 0835) BP: 105/61 mmHg (09/29 0835) Pulse Rate: 82 (09/29 0835) Intake/Output from previous day: 09/28 0701 - 09/29 0700 In: 0  Out: 1295 [Urine:745; Emesis/NG output:550] Intake/Output from this shift: Total I/O In: -  Out: 600 [Urine:600]  Labs:  Recent Labs  11/24/14 0540  WBC 9.4  HGB 12.2  HCT 36.3  PLT 172     Recent Labs  11/23/14 0610 11/24/14 0540 11/25/14 0556  NA 137 134* 131*  K 4.0 4.6 4.5  CL 102 101 103  CO2 26 26 25   GLUCOSE 121* 154* 139*  BUN 5* 11 19  CREATININE 0.82 0.82 0.73  CALCIUM 9.1 8.5* 7.9*  MG  --  1.6* 2.1  PHOS  --  3.3 2.7  PROT  --  5.2* 4.8*  ALBUMIN  --  2.6* 2.4*  AST  --  20 15  ALT  --  12* 10*  ALKPHOS  --  46 47  BILITOT  --  0.5 0.3  PREALBUMIN  --  9.1*  --   TRIG  --  93  --    Estimated Creatinine Clearance: 46.3 mL/min (by C-G formula based on Cr of 0.73).    Recent Labs  11/24/14 2317 11/25/14 0419 11/25/14 0747  GLUCAP 138* 127* 135*    Medical History: Past Medical History  Diagnosis Date  . Hiatal hernia   . Hypertension   . Heart disease   . Hypercholesterolemia   . Arthritis   . IBS (irritable bowel syndrome)   . GERD (gastroesophageal reflux disease)     Medications:  Scheduled:  . antiseptic oral rinse  7 mL Mouth Rinse q12n4p  . brimonidine  1 drop Both Eyes BID   And  . timolol  1 drop Both Eyes BID  . chlorhexidine  15 mL Mouth Rinse BID  . enoxaparin (LOVENOX) injection  40 mg Subcutaneous Q24H  . gabapentin  300 mg Oral BID  . insulin aspart  0-9 Units Subcutaneous 6 times per day  . latanoprost  1 drop Both Eyes QHS  . lubiprostone  24 mcg Oral BID WC   . pantoprazole (PROTONIX) IV  40 mg Intravenous Daily  . simvastatin  40 mg Oral QPM  . traZODone  50 mg Oral QHS   Infusions:  . Marland KitchenTPN (CLINIMIX-E) Adult 65 mL/hr at 11/24/14 1720   And  . fat emulsion 240 mL (11/24/14 1720)   Insulin Requirements: 7 units Novolog 9/28  Current Nutrition: CLD, no supplements ordered  IVF: none  Central access: PICC TPN start date: 9/27  ASSESSMENT                                                                                                          81yo F admitted  w/ N/V and abd pain. CT revealed SBO. She failed conservative management. Now s/p LOA with laparotomy. Pharmacy is asked to start TPN.  Significant events:  9/28: To clamp NG tube today, will remain NPO.   Today:   Glucose - mostly at goal < 150; max 157 x 1  Electrolytes - Na sl low, otherwise wnl  Renal - SCr wnl; CrCl low d/t age  LFTs - wnl, albumin low  TGs - wnl  Prealbumin - 9.1 (9/28)  NUTRITIONAL GOALS                                                                                             RD recs: 1550-1750 Kcal/d and 70-80 g/d protein, 2-2.2 L/d fluid. Clinimix E 5/15 at a goal rate of 65 ml/hr + 20% fat emulsion at 10 ml/hr to provide: 78 g/day protein, 1588 Kcal/day.  PLAN                                                                                                                         At 1800 today:  Continue Clinimix E 5/15 at goal rate of 65 ml/hr.  Diet appears to be advancing appropriately.  Continue 20% fat emulsion at 10 ml/hr.  TPN to contain standard multivitamins and trace elements.  IVF per MD/surgical management  Continue sens SSI q4h  TPN lab panels on Mondays & Thursdays.  No labs for tomorrow, consider rechecking lytes Saturday  F/u daily.  Reuel Boom, PharmD, BCPS Pager: [phone] 11/25/2014, 9:50 AM

## 2014-11-25 NOTE — Progress Notes (Signed)
Patient ID: Leslie Frey, female   DOB: [birth year], 81 y.o.   MRN: [id number] 2 Days Post-Op  Subjective: Pt having less pain. No flatus yet  Objective: Vital signs in last 24 hours: Temp:  [98.1 F (36.7 C)-99.7 F (37.6 C)] 98.1 F (36.7 C) (09/29 0425) Pulse Rate:  [80-95] 80 (09/29 0425) Resp:  [19-20] 20 (09/29 0425) BP: (77-117)/(49-79) 108/70 mmHg (09/29 0425) SpO2:  [96 %-99 %] 99 % (09/29 0425) Last BM Date: 11/22/14  Intake/Output from previous day: 09/28 0701 - 09/29 0700 In: 0  Out: 1295 [Urine:745; Emesis/NG output:550] Intake/Output this shift: Total I/O In: -  Out: 400 [Urine:400]  PE: Abd: soft, appropriately tender, +BS.  Incisions are c/d/i  Lungs: CTAB  Lab Results:   Recent Labs  11/24/14 0540  WBC 9.4  HGB 12.2  HCT 36.3  PLT 172   BMET  Recent Labs  11/24/14 0540 11/25/14 0556  NA 134* 131*  K 4.6 4.5  CL 101 103  CO2 26 25  GLUCOSE 154* 139*  BUN 11 19  CREATININE 0.82 0.73  CALCIUM 8.5* 7.9*   PT/INR No results for input(s): LABPROT, INR in the last 72 hours. CMP     Component Value Date/Time   NA 131* 11/25/2014 0556   K 4.5 11/25/2014 0556   CL 103 11/25/2014 0556   CO2 25 11/25/2014 0556   GLUCOSE 139* 11/25/2014 0556   BUN 19 11/25/2014 0556   CREATININE 0.73 11/25/2014 0556   CALCIUM 7.9* 11/25/2014 0556   PROT 4.8* 11/25/2014 0556   ALBUMIN 2.4* 11/25/2014 0556   AST 15 11/25/2014 0556   ALT 10* 11/25/2014 0556   ALKPHOS 47 11/25/2014 0556   BILITOT 0.3 11/25/2014 0556   GFRNONAA >60 11/25/2014 0556   GFRAA >60 11/25/2014 0556   Lipase     Component Value Date/Time   LIPASE 27 11/15/2014 1504       Studies/Results: Dg Chest Port 1 View  11/24/2014   ADDENDUM REPORT: 11/24/2014 11:29 ADDENDUM: OG tube projects over the stomach and dilated small bowel loops are partially visualized on this examination consistent with history of small bowel obstruction. Electronically Signed   By: Skipper Cliche M.D.    On: 11/24/2014 11:29  11/24/2014   CLINICAL DATA:  Small bowel obstruction, cough today  EXAM: PORTABLE CHEST 1 VIEW  COMPARISON:  11/15/2014  FINDINGS: Mild cardiac enlargement stable. Right PICC line identified with tip at the cavoatrial junction. Moderate to large hiatal hernia. Vascular pattern normal. Mild bibasilar atelectasis.  IMPRESSION: No acute findings.  Mild bilateral lower lobe atelectasis.  Electronically Signed: By: Skipper Cliche M.D. On: 11/24/2014 11:25   Dg Abd 2 Views  11/23/2014   CLINICAL DATA:  Mid abdominal pain.  Small-bowel obstruction.  EXAM: ABDOMEN - 2 VIEW  COMPARISON:  Plain films of the abdomen 11/18/2014, 11/20/2014 and 11/21/2014.  FINDINGS: No free intraperitoneal air is identified. Contrast material in the colon seen on the prior examination has progressed somewhat. There is dilatation of small bowel with loops measuring up to 4.0 cm. Gaseous distention of small bowel has improved since yesterday's examination. NG tube is in place.  IMPRESSION: Continued improvement in small bowel obstruction. No new abnormality.   Electronically Signed   By: Inge Rise M.D.   On: 11/23/2014 10:25    Anti-infectives: Anti-infectives    Start     Dose/Rate Route Frequency Ordered Stop   11/23/14 1100  cefTRIAXone (ROCEPHIN) 2 g in dextrose 5 % 50  mL IVPB     2 g 100 mL/hr over 30 Minutes Intravenous On call to O.R. 11/23/14 1059 11/23/14 1258       Assessment/Plan  POD 2, s/p laparoscopic LOA with mini-laparotomy, Dr. Marcello Moores -doing quite well today.  NG out -will try some clears today -PT eval and treatment for post op mobilization -IS -IV pain meds for now -Cont TPN DVT prophylaxis -SCDs/start lovenox today  LOS: 10 days    Rosario Adie. 9/38/1017, 5:10 AM Pager: [phone]

## 2014-11-25 NOTE — Progress Notes (Addendum)
[facility] PROGRESS NOTE Interim History: 81 year-old with past medical history of essential hypertension who presents to the ED with a small bowel obstruction surgery was consulted she had been treated conservatively. Surgery was considering surgical intervention but a repeated abdominal x-ray on 11/22/2014 showed the contrast moving long. So NG tube was clamped and start her on diet which she did not tolerate an NG tube had to be restarted to intermittent suction within 24 hours.   Assessment/Plan: SBO (small bowel obstruction) due to adhesion: - NG tube d'cd, no further nausea or vomiting. - Appreciate surgery's assistance. - Cont TNA per surgery.  Bigeminy/ Trigeminy: - Continue Beta-blocker .  Essential hypertension: - Continue metoprolol IV for a HR > 110.  Hypomagnesemia/Hypokalemia: - Secondary to GI losses nasogastric tube output has decreased. Continue to monitor electrolytes. - KVO IV fluids.  Normocytic anemia - Mild drop likely dilutional continue to monitor.  Leukocytosis - Likely reactive, resolved.  Thrombocytopenia Resolved, patient on SCDs.  CAD (coronary artery disease), native coronary artery Asymptomatic, aspirin on hold.  Dyslipidemia: Resume statins.    Code Status: full Family Communication: daughter  Disposition Plan: Home once small bowel obstruction resolved.  Consultants:  Surgery  Cardiology  Procedures:  CXR  Abd x-ray's  CT abd and pelvis  Antibiotics:  none  HPI/Subjective: She denies any BM or flatus.  Objective: Filed Vitals:   11/24/14 1706 11/24/14 2023 11/25/14 0425 11/25/14 0835  BP: 117/79 115/56 108/70 105/61  Pulse: 85 95 80 82  Temp: 98.5 F (36.9 C) 99.7 F (37.6 C) 98.1 F (36.7 C) 98.8 F (37.1 C)  TempSrc: Oral Oral Oral Oral  Resp: 20 20 20 20   Height:      Weight:      SpO2: 99% 99% 99% 99%    Intake/Output Summary (Last 24 hours) at 11/25/14 1003 Last data filed at 11/25/14  0917  Gross per 24 hour  Intake      0 ml  Output   1170 ml  Net  -1170 ml   Filed Weights   11/16/14 1348  Weight: 62.596 kg (138 lb)    Exam:  General: Alert, awake, oriented x3, in no acute distress.  HEENT: No bruits, no goiter.  Heart: Regular rate and rhythm. Lungs: Good air movement, clear, no rales. Abdomen: Soft,tenderness diffuse, nondistended, positive bowel sounds.  Neuro: Grossly intact, nonfocal.   Data Reviewed: Basic Metabolic Panel:  Recent Labs Lab 11/19/14 0540 11/21/14 1136 11/22/14 0540 11/23/14 0610 11/24/14 0540 11/25/14 0556  NA 144 138 139 137 134* 131*  K 3.8 4.4 4.2 4.0 4.6 4.5  CL 109 107 106 102 101 103  CO2 29 28 27 26 26 25   GLUCOSE 133* 134* 115* 121* 154* 139*  BUN 13 5* 7 5* 11 19  CREATININE 0.83 0.85 0.81 0.82 0.82 0.73  CALCIUM 8.5* 8.5* 8.7* 9.1 8.5* 7.9*  MG 2.1  --   --   --  1.6* 2.1  PHOS  --   --   --   --  3.3 2.7   Liver Function Tests:  Recent Labs Lab 11/24/14 0540 11/25/14 0556  AST 20 15  ALT 12* 10*  ALKPHOS 46 47  BILITOT 0.5 0.3  PROT 5.2* 4.8*  ALBUMIN 2.6* 2.4*   No results for input(s): LIPASE, AMYLASE in the last 168 hours. No results for input(s): AMMONIA in the last 168 hours. CBC:  Recent Labs Lab 11/19/14 0540 11/21/14 1136 11/22/14 0540 11/24/14 0540  WBC 8.3 10.4 7.1 9.4  NEUTROABS  --   --   --  7.0  HGB 12.0 11.3* 11.1* 12.2  HCT 35.9* 34.3* 33.1* 36.3  MCV 94.7 93.0 93.2 92.6  PLT 125* 140* 144* 172   Cardiac Enzymes: No results for input(s): CKTOTAL, CKMB, CKMBINDEX, TROPONINI in the last 168 hours. BNP (last 3 results) No results for input(s): BNP in the last 8760 hours.  ProBNP (last 3 results) No results for input(s): PROBNP in the last 8760 hours.  CBG:  Recent Labs Lab 11/24/14 1704 11/24/14 2022 11/24/14 2317 11/25/14 0419 11/25/14 0747  GLUCAP 112* 129* 138* 127* 135*    Recent Results (from the past 240 hour(s))  Culture, Urine     Status: None    Collection Time: 11/16/14 11:57 AM  Result Value Ref Range Status   Specimen Description URINE, RANDOM  Final   Special Requests NONE  Final   Culture   Final    MULTIPLE SPECIES PRESENT, SUGGEST RECOLLECTION Performed at [facility]    Report Status 11/17/2014 FINAL  Final     Studies: Dg Chest Port 1 View  11/24/2014   ADDENDUM REPORT: 11/24/2014 11:29 ADDENDUM: OG tube projects over the stomach and dilated small bowel loops are partially visualized on this examination consistent with history of small bowel obstruction. Electronically Signed   By: Skipper Cliche M.D.   On: 11/24/2014 11:29  11/24/2014   CLINICAL DATA:  Small bowel obstruction, cough today  EXAM: PORTABLE CHEST 1 VIEW  COMPARISON:  11/15/2014  FINDINGS: Mild cardiac enlargement stable. Right PICC line identified with tip at the cavoatrial junction. Moderate to large hiatal hernia. Vascular pattern normal. Mild bibasilar atelectasis.  IMPRESSION: No acute findings.  Mild bilateral lower lobe atelectasis.  Electronically Signed: By: Skipper Cliche M.D. On: 11/24/2014 11:25   Dg Abd 2 Views  11/23/2014   CLINICAL DATA:  Mid abdominal pain.  Small-bowel obstruction.  EXAM: ABDOMEN - 2 VIEW  COMPARISON:  Plain films of the abdomen 11/18/2014, 11/20/2014 and 11/21/2014.  FINDINGS: No free intraperitoneal air is identified. Contrast material in the colon seen on the prior examination has progressed somewhat. There is dilatation of small bowel with loops measuring up to 4.0 cm. Gaseous distention of small bowel has improved since yesterday's examination. NG tube is in place.  IMPRESSION: Continued improvement in small bowel obstruction. No new abnormality.   Electronically Signed   By: Inge Rise M.D.   On: 11/23/2014 10:25    Scheduled Meds: . antiseptic oral rinse  7 mL Mouth Rinse q12n4p  . brimonidine  1 drop Both Eyes BID   And  . timolol  1 drop Both Eyes BID  . chlorhexidine  15 mL Mouth Rinse BID  .  enoxaparin (LOVENOX) injection  40 mg Subcutaneous Q24H  . gabapentin  300 mg Oral BID  . insulin aspart  0-9 Units Subcutaneous 6 times per day  . latanoprost  1 drop Both Eyes QHS  . lubiprostone  24 mcg Oral BID WC  . pantoprazole (PROTONIX) IV  40 mg Intravenous Daily  . simvastatin  40 mg Oral QPM  . traZODone  50 mg Oral QHS   Continuous Infusions: . Marland KitchenTPN (CLINIMIX-E) Adult 65 mL/hr at 11/24/14 1720   And  . fat emulsion 240 mL (11/24/14 1720)  . Marland KitchenTPN (CLINIMIX-E) Adult     And  . fat emulsion      Time Spent: 25 min   Leslie Frey, ABRAHAM  [facility] Pager [phone].  If 7PM-7AM, please contact night-coverage at [email or web address], password [facility] 11/25/2014, 10:03 AM  LOS: 10 days

## 2014-11-26 LAB — GLUCOSE, CAPILLARY
GLUCOSE-CAPILLARY: 103 mg/dL — AB (ref 65–99)
GLUCOSE-CAPILLARY: 108 mg/dL — AB (ref 65–99)
GLUCOSE-CAPILLARY: 139 mg/dL — AB (ref 65–99)
GLUCOSE-CAPILLARY: 149 mg/dL — AB (ref 65–99)
Glucose-Capillary: 134 mg/dL — ABNORMAL HIGH (ref 65–99)
Glucose-Capillary: 135 mg/dL — ABNORMAL HIGH (ref 65–99)
Glucose-Capillary: 152 mg/dL — ABNORMAL HIGH (ref 65–99)

## 2014-11-26 MED ORDER — M.V.I. ADULT IV INJ
INJECTION | INTRAVENOUS | Status: AC
  Administered 2014-11-26: 18:00:00 via INTRAVENOUS
  Filled 2014-11-26: qty 1560

## 2014-11-26 MED ORDER — FAT EMULSION 20 % IV EMUL
240.0000 mL | INTRAVENOUS | Status: AC
  Administered 2014-11-26: 240 mL via INTRAVENOUS
  Filled 2014-11-26: qty 240

## 2014-11-26 NOTE — Progress Notes (Signed)
PARENTERAL NUTRITION CONSULT NOTE  Pharmacy Consult for TPN Indication: small bowel obstruction  No Known Allergies  Patient Measurements: Height: 5' (152.4 cm) Weight: 138 lb (62.596 kg) IBW/kg (Calculated) : 45.5  Adjusted body weight: 51kg  Vital Signs: Temp: 98 F (36.7 C) (09/30 0602) Temp Source: Oral (09/30 0602) BP: 119/47 mmHg (09/30 0602) Pulse Rate: 84 (09/30 0602) Intake/Output from previous day: 09/29 0701 - 09/30 0700 In: 120 [P.O.:120] Out: 3050 [Urine:3050] Intake/Output from this shift:    Labs:  Recent Labs  11/24/14 0540  WBC 9.4  HGB 12.2  HCT 36.3  PLT 172     Recent Labs  11/24/14 0540 11/25/14 0556  NA 134* 131*  K 4.6 4.5  CL 101 103  CO2 26 25  GLUCOSE 154* 139*  BUN 11 19  CREATININE 0.82 0.73  CALCIUM 8.5* 7.9*  MG 1.6* 2.1  PHOS 3.3 2.7  PROT 5.2* 4.8*  ALBUMIN 2.6* 2.4*  AST 20 15  ALT 12* 10*  ALKPHOS 46 47  BILITOT 0.5 0.3  PREALBUMIN 9.1*  --   TRIG 93  --    Estimated Creatinine Clearance: 46.3 mL/min (by C-G formula based on Cr of 0.73).    Recent Labs  11/25/14 2358 11/26/14 0418 11/26/14 0741  GLUCAP 134* 135* 152*    Medical History: Past Medical History  Diagnosis Date  . Hiatal hernia   . Hypertension   . Heart disease   . Hypercholesterolemia   . Arthritis   . IBS (irritable bowel syndrome)   . GERD (gastroesophageal reflux disease)     Medications:  Scheduled:  . antiseptic oral rinse  7 mL Mouth Rinse q12n4p  . brimonidine  1 drop Both Eyes BID   And  . timolol  1 drop Both Eyes BID  . chlorhexidine  15 mL Mouth Rinse BID  . enoxaparin (LOVENOX) injection  40 mg Subcutaneous Q24H  . gabapentin  300 mg Oral BID  . insulin aspart  0-9 Units Subcutaneous 6 times per day  . latanoprost  1 drop Both Eyes QHS  . lubiprostone  24 mcg Oral BID WC  . pantoprazole (PROTONIX) IV  40 mg Intravenous Q12H  . simvastatin  40 mg Oral QPM  . traZODone  50 mg Oral QHS   Infusions:  . Marland KitchenTPN  (CLINIMIX-E) Adult 65 mL/hr at 11/25/14 1745   And  . fat emulsion 240 mL (11/25/14 1744)   Insulin Requirements: 6 units Novolog 9/29  Current Nutrition: CLD, no supplements ordered  IVF: none  Central access: PICC TPN start date: 9/27  ASSESSMENT                                                                                                          82yo F admitted w/ N/V and abd pain. CT revealed SBO. She failed conservative management. Now s/p LOA with laparotomy. Pharmacy is asked to start TPN.  Significant events:  9/28: To clamp NG tube today, will remain NPO.  9/29: Tolerated some clears but not advancing diet today, Possible ileus  Today: no new labs, labs from 9/29  Glucose - mostly at goal < 150; max 157 x 1  Electrolytes - Na sl low, otherwise wnl  Renal - SCr wnl; CrCl low d/t age  LFTs - wnl, albumin low  TGs - wnl  Prealbumin - 9.1 (9/28)  NUTRITIONAL GOALS                                                                                             RD recs: 1550-1750 Kcal/d and 70-80 g/d protein, 2-2.2 L/d fluid. Clinimix E 5/15 at a goal rate of 65 ml/hr + 20% fat emulsion at 10 ml/hr to provide: 78 g/day protein, 1588 Kcal/day.  PLAN                                                                                                                         At 1800 today:  Continue Clinimix E 5/15 at goal rate of 65 ml/hr.  Continue 20% fat emulsion at 10 ml/hr.  TPN to contain standard multivitamins and trace elements.  IVF per MD/surgical management  Continue sens SSI q4h  TPN lab panels on Mondays & Thursdays.  Will order BMP, Mg, Phos for tomorrow AM  F/u daily.  Viann Fish, PharmD Candidate  11/26/2014, 11:29 AM

## 2014-11-26 NOTE — Discharge Instructions (Signed)
[facility]      [facility] [phone]  OPEN ABDOMINAL SURGERY: POST OP INSTRUCTIONS  Always review your discharge instruction sheet given to you by the facility where your surgery was performed.  IF YOU HAVE DISABILITY OR FAMILY LEAVE FORMS, YOU MUST BRING THEM TO THE OFFICE FOR PROCESSING.  PLEASE DO NOT GIVE THEM TO YOUR DOCTOR.  1. A prescription for pain medication may be given to you upon discharge.  Take your pain medication as prescribed, if needed.  If narcotic pain medicine is not needed, then you may take acetaminophen (Tylenol) or ibuprofen (Advil) as needed. 2. Take your usually prescribed medications unless otherwise directed. 3. If you need a refill on your pain medication, please contact your pharmacy. They will contact our office to request authorization.  Prescriptions will not be filled after 5pm or on week-ends. 4. You should follow a light diet the first few days after arrival home, such as soup and crackers, pudding, etc.unless your doctor has advised otherwise. A high-fiber, low fat diet can be resumed as tolerated.   Be sure to include lots of fluids daily. Most patients will experience some swelling and bruising on the chest and neck area.  Ice packs will help.  Swelling and bruising can take several days to resolve 5. Most patients will experience some swelling and bruising in the area of the incision. Ice pack will help. Swelling and bruising can take several days to resolve..  6. It is common to experience some constipation if taking pain medication after surgery.  Increasing fluid intake and taking a stool softener will usually help or prevent this problem from occurring.  A mild laxative (Milk of Magnesia or Miralax) should be taken according to package directions if there are no bowel movements after 48 hours. 7.  You may have steri-strips (small skin tapes) in place directly over the incision.  These strips should be left on the skin for 7-10 days.  If your  surgeon used skin glue on the incision, you may shower in 24 hours.  The glue will flake off over the next 2-3 weeks.  Any sutures or staples will be removed at the office during your follow-up visit. You may find that a light gauze bandage over your incision may keep your staples from being rubbed or pulled. You may shower and replace the bandage daily. 8. ACTIVITIES:  You may resume regular (light) daily activities beginning the next day--such as daily self-care, walking, climbing stairs--gradually increasing activities as tolerated.  You may have sexual intercourse when it is comfortable.  Refrain from any heavy lifting or straining until approved by your doctor. a. You may drive when you no longer are taking prescription pain medication, you can comfortably wear a seatbelt, and you can safely maneuver your car and apply brakes b. Return to Work: ___________________________________ 9. You should see your doctor in the office for a follow-up appointment approximately two weeks after your surgery.  Make sure that you call for this appointment within a day or two after you arrive home to insure a convenient appointment time. OTHER INSTRUCTIONS:  _____________________________________________________________ _____________________________________________________________  WHEN TO CALL YOUR DOCTOR: 1. Fever over 101.0 2. Inability to urinate 3. Nausea and/or vomiting 4. Extreme swelling or bruising 5. Continued bleeding from incision. 6. Increased pain, redness, or drainage from the incision. 7. Difficulty swallowing or breathing 8. Muscle cramping or spasms. 9. Numbness or tingling in hands or feet or around lips.  The clinic staff is available to   answer your questions during regular business hours.  Please don't hesitate to call and ask to speak to one of the nurses if you have concerns.  For further questions, please visit [email or web address]   

## 2014-11-26 NOTE — Progress Notes (Signed)
Patient ID: Leslie Frey, female   DOB: 1932-07-10, 81 y.o.   MRN: [id number] 3 Days Post-Op  Subjective: Pt feels a little nauseated, but no emesis.  Ate a small amount of breakfast.  No flatus or BM  Objective: Vital signs in last 24 hours: Temp:  [98 F (36.7 C)-98.7 F (37.1 C)] 98 F (36.7 C) (09/30 0602) Pulse Rate:  [78-94] 84 (09/30 0602) Resp:  [16-20] 20 (09/30 0602) BP: (98-119)/(47-69) 119/47 mmHg (09/30 0602) SpO2:  [96 %-99 %] 98 % (09/30 0602) Last BM Date: 11/22/14  Intake/Output from previous day: 09/29 0701 - 09/30 0700 In: 120 [P.O.:120] Out: 3050 [Urine:3050] Intake/Output this shift:    PE: Abd: soft, appropriately tender, few BS, ND Heart: regular  Lab Results:   Recent Labs  11/24/14 0540  WBC 9.4  HGB 12.2  HCT 36.3  PLT 172   BMET  Recent Labs  11/24/14 0540 11/25/14 0556  NA 134* 131*  K 4.6 4.5  CL 101 103  CO2 26 25  GLUCOSE 154* 139*  BUN 11 19  CREATININE 0.82 0.73  CALCIUM 8.5* 7.9*   PT/INR No results for input(s): LABPROT, INR in the last 72 hours. CMP     Component Value Date/Time   NA 131* 11/25/2014 0556   K 4.5 11/25/2014 0556   CL 103 11/25/2014 0556   CO2 25 11/25/2014 0556   GLUCOSE 139* 11/25/2014 0556   BUN 19 11/25/2014 0556   CREATININE 0.73 11/25/2014 0556   CALCIUM 7.9* 11/25/2014 0556   PROT 4.8* 11/25/2014 0556   ALBUMIN 2.4* 11/25/2014 0556   AST 15 11/25/2014 0556   ALT 10* 11/25/2014 0556   ALKPHOS 47 11/25/2014 0556   BILITOT 0.3 11/25/2014 0556   GFRNONAA >60 11/25/2014 0556   GFRAA >60 11/25/2014 0556   Lipase     Component Value Date/Time   LIPASE 27 11/15/2014 1504       Studies/Results: Dg Chest Port 1 View  11/24/2014   ADDENDUM REPORT: 11/24/2014 11:29 ADDENDUM: OG tube projects over the stomach and dilated small bowel loops are partially visualized on this examination consistent with history of small bowel obstruction. Electronically Signed   By: Skipper Cliche M.D.   On:  11/24/2014 11:29  11/24/2014   CLINICAL DATA:  Small bowel obstruction, cough today  EXAM: PORTABLE CHEST 1 VIEW  COMPARISON:  11/15/2014  FINDINGS: Mild cardiac enlargement stable. Right PICC line identified with tip at the cavoatrial junction. Moderate to large hiatal hernia. Vascular pattern normal. Mild bibasilar atelectasis.  IMPRESSION: No acute findings.  Mild bilateral lower lobe atelectasis.  Electronically Signed: By: Skipper Cliche M.D. On: 11/24/2014 11:25    Anti-infectives: Anti-infectives    Start     Dose/Rate Route Frequency Ordered Stop   11/23/14 1100  cefTRIAXone (ROCEPHIN) 2 g in dextrose 5 % 50 mL IVPB     2 g 100 mL/hr over 30 Minutes Intravenous On call to O.R. 11/23/14 1059 11/23/14 1258       Assessment/Plan   POD 3, s/p laparoscopic LOA with mini-laparotomy, Dr. Marcello Moores -some nausea, but no emesis.  Ok to keep on clears, but would definitely not advance past this at this point.  She may have a mild ileus. -cont to mobilize and pulm toilet -iv pain meds for now while still on clears DVT prophylaxis -SCDs/Lovenox  LOS: 11 days    Shalah Estelle E 11/26/2014, 9:02 AM Pager: [phone]

## 2014-11-26 NOTE — Progress Notes (Signed)
PT Cancellation Note  Patient Details Name: Leslie Frey MRN: [id number] DOB: 1932-04-19   Cancelled Treatment:    Reason Eval/Treat Not Completed: Pain limiting ability to participate--pt requested PT check back another time. She has a headache right now. Will check back as schedule allows. Thanks.    Weston Anna, MPT Pager: [phone]

## 2014-11-26 NOTE — Progress Notes (Signed)
[facility] PROGRESS NOTE Interim History: 81 year-old with past medical history of essential hypertension who presents to the ED with a small bowel obstruction surgery was consulted she had been treated conservatively. Surgery was considering surgical intervention but a repeated abdominal x-ray on 11/22/2014 showed the contrast moving long. So NG tube was clamped and start her on diet which she did not tolerate an NG tube had to be restarted to intermittent suction within 24 hours.   Assessment/Plan: SBO (small bowel obstruction) due to adhesion: - NG tube d'cd, no further nausea or vomiting. - Appreciate surgery's assistance. She is tolerating her clears have not had a bowel movement or passing gas denies abdominal pain. - Cont TNA per surgery.  Bigeminy/ Trigeminy: - Continue Beta-blocker .  Essential hypertension: - Continue metoprolol IV for a HR > 110.  Hypomagnesemia/Hypokalemia: - Secondary to GI losses nasogastric tube output has decreased. Continue to monitor electrolytes. - KVO IV fluids.  Normocytic anemia - Mild drop likely dilutional continue to monitor.  Leukocytosis - Likely reactive, resolved.  Thrombocytopenia Resolved, patient on SCDs.  CAD (coronary artery disease), native coronary artery Asymptomatic, aspirin on hold.  Dyslipidemia: Resume statins.    Code Status: full Family Communication: daughter  Disposition Plan: Home in a.m.  Consultants:  Surgery  Cardiology  Procedures:  CXR  Abd x-ray's  CT abd and pelvis  Antibiotics:  none  HPI/Subjective: She denies any BM or flatus. Tolerating diet denies abdominal pain.  Objective: Filed Vitals:   11/25/14 0835 11/25/14 1345 11/25/14 2028 11/26/14 0602  BP: 105/61 104/58 98/69 119/47  Pulse: 82 78 94 84  Temp: 98.8 F (37.1 C) 98.7 F (37.1 C) 98.5 F (36.9 C) 98 F (36.7 C)  TempSrc: Oral Oral Oral Oral  Resp: 20 20 16 20   Height:      Weight:      SpO2: 99%  99% 96% 98%    Intake/Output Summary (Last 24 hours) at 11/26/14 0829 Last data filed at 11/26/14 0602  Gross per 24 hour  Intake    120 ml  Output   2650 ml  Net  -2530 ml   Filed Weights   11/16/14 1348  Weight: 62.596 kg (138 lb)    Exam:  General: Alert, awake, oriented x3, in no acute distress.  HEENT: No bruits, no goiter.  Heart: Regular rate and rhythm. Lungs: Good air movement, clear, no rales. Abdomen: Soft, not tender to palpation, nondistended, positive bowel sounds.  Neuro: Grossly intact, nonfocal.   Data Reviewed: Basic Metabolic Panel:  Recent Labs Lab 11/21/14 1136 11/22/14 0540 11/23/14 0610 11/24/14 0540 11/25/14 0556  NA 138 139 137 134* 131*  K 4.4 4.2 4.0 4.6 4.5  CL 107 106 102 101 103  CO2 28 27 26 26 25   GLUCOSE 134* 115* 121* 154* 139*  BUN 5* 7 5* 11 19  CREATININE 0.85 0.81 0.82 0.82 0.73  CALCIUM 8.5* 8.7* 9.1 8.5* 7.9*  MG  --   --   --  1.6* 2.1  PHOS  --   --   --  3.3 2.7   Liver Function Tests:  Recent Labs Lab 11/24/14 0540 11/25/14 0556  AST 20 15  ALT 12* 10*  ALKPHOS 46 47  BILITOT 0.5 0.3  PROT 5.2* 4.8*  ALBUMIN 2.6* 2.4*   No results for input(s): LIPASE, AMYLASE in the last 168 hours. No results for input(s): AMMONIA in the last 168 hours. CBC:  Recent Labs Lab 11/21/14 1136 11/22/14  0540 11/24/14 0540  WBC 10.4 7.1 9.4  NEUTROABS  --   --  7.0  HGB 11.3* 11.1* 12.2  HCT 34.3* 33.1* 36.3  MCV 93.0 93.2 92.6  PLT 140* 144* 172   Cardiac Enzymes: No results for input(s): CKTOTAL, CKMB, CKMBINDEX, TROPONINI in the last 168 hours. BNP (last 3 results) No results for input(s): BNP in the last 8760 hours.  ProBNP (last 3 results) No results for input(s): PROBNP in the last 8760 hours.  CBG:  Recent Labs Lab 11/25/14 1736 11/25/14 2027 11/25/14 2358 11/26/14 0418 11/26/14 0741  GLUCAP 123* 131* 134* 135* 152*    Recent Results (from the past 240 hour(s))  Culture, Urine     Status: None     Collection Time: 11/16/14 11:57 AM  Result Value Ref Range Status   Specimen Description URINE, RANDOM  Final   Special Requests NONE  Final   Culture   Final    MULTIPLE SPECIES PRESENT, SUGGEST RECOLLECTION Performed at [facility]    Report Status 11/17/2014 FINAL  Final     Studies: Dg Chest Port 1 View  11/24/2014   ADDENDUM REPORT: 11/24/2014 11:29 ADDENDUM: OG tube projects over the stomach and dilated small bowel loops are partially visualized on this examination consistent with history of small bowel obstruction. Electronically Signed   By: Skipper Cliche M.D.   On: 11/24/2014 11:29  11/24/2014   CLINICAL DATA:  Small bowel obstruction, cough today  EXAM: PORTABLE CHEST 1 VIEW  COMPARISON:  11/15/2014  FINDINGS: Mild cardiac enlargement stable. Right PICC line identified with tip at the cavoatrial junction. Moderate to large hiatal hernia. Vascular pattern normal. Mild bibasilar atelectasis.  IMPRESSION: No acute findings.  Mild bilateral lower lobe atelectasis.  Electronically Signed: By: Skipper Cliche M.D. On: 11/24/2014 11:25    Scheduled Meds: . antiseptic oral rinse  7 mL Mouth Rinse q12n4p  . brimonidine  1 drop Both Eyes BID   And  . timolol  1 drop Both Eyes BID  . chlorhexidine  15 mL Mouth Rinse BID  . enoxaparin (LOVENOX) injection  40 mg Subcutaneous Q24H  . gabapentin  300 mg Oral BID  . insulin aspart  0-9 Units Subcutaneous 6 times per day  . latanoprost  1 drop Both Eyes QHS  . lubiprostone  24 mcg Oral BID WC  . pantoprazole (PROTONIX) IV  40 mg Intravenous Q12H  . simvastatin  40 mg Oral QPM  . traZODone  50 mg Oral QHS   Continuous Infusions: . Marland KitchenTPN (CLINIMIX-E) Adult 65 mL/hr at 11/25/14 1745   And  . fat emulsion 240 mL (11/25/14 1744)    Time Spent: 15 min   FELIZ Marguarite Arbour  [facility] Pager [phone].  If 7PM-7AM, please contact night-coverage at [email or web address], password [facility] 11/26/2014, 8:29 AM  LOS: 11 days

## 2014-11-26 NOTE — Progress Notes (Signed)
Physical Therapy Treatment Patient Details Name: Leslie Frey MRN: [id number] DOB: 1932-08-21 Today's Date: 11/26/2014    History of Present Illness 81 yo female admitted with SBO. Hx of HTn, hiatal hernia, MI    PT Comments    Progressing with mobility. Pt tolerated activity fairly well. Encouraged pt to ambulate as often as possible. Can do this with nursing or family.   Follow Up Recommendations  No PT follow up;Supervision - Intermittent     Equipment Recommendations  None recommended by PT    Recommendations for Other Services       Precautions / Restrictions Precautions Precautions: Fall Restrictions Weight Bearing Restrictions: No    Mobility  Bed Mobility               General bed mobility comments: oob in recliner  Transfers Overall transfer level: Needs assistance   Transfers: Sit to/from Stand Sit to Stand: Supervision         General transfer comment: supervision for safety. Increased time  Ambulation/Gait Ambulation/Gait assistance: Min guard Ambulation Distance (Feet): 350 Feet Assistive device:  (IV pole) Gait Pattern/deviations: Step-to pattern;Decreased stride length     General Gait Details: close guard for safety. slow gait speed. slightly unsteady at times.   Stairs            Wheelchair Mobility    Modified Rankin (Stroke Patients Only)       Balance                                    Cognition Arousal/Alertness: Awake/alert Behavior During Therapy: WFL for tasks assessed/performed Overall Cognitive Status: Within Functional Limits for tasks assessed                      Exercises      General Comments        Pertinent Vitals/Pain Pain Assessment: Faces Faces Pain Scale: Hurts a little bit Pain Location: abdomen Pain Descriptors / Indicators: Sore Pain Intervention(s): Monitored during session;Repositioned    Home Living                      Prior Function           PT Goals (current goals can now be found in the care plan section) Progress towards PT goals: Progressing toward goals    Frequency  Min 3X/week    PT Plan Current plan remains appropriate    Co-evaluation             End of Session   Activity Tolerance: Patient tolerated treatment well Patient left: in chair;with call bell/phone within reach     Time: 4097-3532 PT Time Calculation (min) (ACUTE ONLY): 11 min  Charges:  $Gait Training: 8-22 mins                    G Codes:      Weston Anna, MPT Pager: [phone]

## 2014-11-26 NOTE — Progress Notes (Signed)
Nutrition Follow-up  DOCUMENTATION CODES:   Not applicable  INTERVENTION:  - Continue CLD and advance diet per surgery - TPN per pharmacy - RD will continue to monitor for needs  NUTRITION DIAGNOSIS:   Inadequate oral intake related to inability to eat as evidenced by NPO status. -ongoing despite advancement to CLD  GOAL:   Patient will meet greater than or equal to 90% of their needs -met with TPN  MONITOR:   Weight trends, Labs, Skin, I & O's, Other (Comment) (TPN regimen)  ASSESSMENT:   81 y.o. female with h/o HTN, HLD, hiatal hernia. Patient presents to ED with epigastric pain and vomiting. Vomiting has been non-stop since last evening. Has been having trouble with constipation over last week or 2 and has been many days since last BM. Pain is moderate in intensity, located in epigastric area, non-radiating.  9/30 NGT now removed and diet advanced to CLD yesterday (9/29) @ 0859. Pt ate a few bites of jello this AM and states that she tolerated this well but that she was unable to tolerate consuming any other liquids this morning. Surgery note this AM indicates plan to keep pt on CLD and states that possible ileus present.  Spoke with pharmacist who states plan to keep current TPN regimen the same. Pt is currently receiving Clinimix E 5/15 @ 65 mL/hr with 20% lipids @ 10 mL/hr which is providing 78 grams protein and 1588 kcal which is meeting needs.  Medications reviewed. Labs reviewed; CBGs: 112-158 mg/dL, Na: 131 mmol/L, Ca: 7.9 mg/dL.    9/28 - New consult for TPN received yesterday while pt was in OR.  - NGT remains in place with 500cc medium brown drainage at time of RD visit.  - Pt POD #1ex laparoscopic LOA and mini-laparotomy. Pt reports abdominal pain this AM. - She is currently receiving Clinimix E 5/15 @ 40 mL/hr with 20% lipids @ 10 mL/hr which is providing 48 grams protein and 1162 kcal which is not meeting needs. Needs updated based on s/p surgery.  - Per  pharmacy note yesterday AM: At 1800 today:  Start Clinimix E 5/15 at 52ml/hr.  20% fat emulsion at 18ml/hr.  Plan to advance as tolerated to the goal rate.  TPN to contain standard multivitamins and trace elements.  Reduce IVF to 20ml/hr.  Add Sens SSI q4h.   Plan for Clinimix E 5/15 @ 60 mL/hr with 20% lipids @ 10 mL/hr which will provide 72 grams protein, 1502 kcal (this will meet 100% protein and 97% minimum estimated kcal needs)  9/26 - Diet advanced to CLD on 9/23 with no documented intakes. Diet again NPO as of 9/24 @ 0953.  -Pt has been NPO since admission other than 9/23 (6 days total). - Pt states that she is feeling nauseated this AM and as if she is going to vomit. - Per surgery note this AM: She likely could have her NGT clamped and try some sips of liquids, but the daughter and nurse state that the patient chokes even with just ice chips with the NGT in place, so this is not safe. - ~100cc tan-colored drainage in canister at time of visit; pt and daughter state that canister was recently changed.   9/20 - Pt has been NPO since admission with NGT to suction placed last night.  - Wall canister full at time of RD visit.  - Pt denies abdominal pain after Morphine given recently and denies nausea.  - She states PTA she had abdominal pain  x1.5 days with no associated nausea or vomiting.  - Pt's daughter at bedside reports pt began living with her 1 month ago.  - She states pt has never been a big eater and that she has not noticed any changes in appetite recently.  - Pt denies swallowing difficulty but states she has problems chewing meat and needs to "chew until it does not even taste good."  - No muscle or fat wasting present at this time.  - She states she had ice chips today which she tolerated well; will monitor for ability for diet to be advanced.   Diet Order:  Diet clear liquid Room service appropriate?: Yes; Fluid consistency:: Thin .TPN (CLINIMIX-E)  Adult  Skin:  Reviewed, no issues  Last BM:  9/26  Height:   Ht Readings from Last 1 Encounters:  11/16/14 5' (1.524 m)    Weight:   Wt Readings from Last 1 Encounters:  11/16/14 138 lb (62.596 kg)    Ideal Body Weight:  45.45 kg (kg)  BMI:  Body mass index is 26.95 kg/(m^2).  Estimated Nutritional Needs:   Kcal:  1550-1750  Protein:  70-80 grams  Fluid:  2-2.2 L/day  EDUCATION NEEDS:   No education needs identified at this time      Jarome Matin, RD, LDN Inpatient Clinical Dietitian Pager # [phone] After hours/weekend pager # [phone]

## 2014-11-27 DIAGNOSIS — D72829 Elevated white blood cell count, unspecified: Secondary | ICD-10-CM

## 2014-11-27 LAB — GLUCOSE, CAPILLARY
GLUCOSE-CAPILLARY: 127 mg/dL — AB (ref 65–99)
Glucose-Capillary: 118 mg/dL — ABNORMAL HIGH (ref 65–99)
Glucose-Capillary: 141 mg/dL — ABNORMAL HIGH (ref 65–99)
Glucose-Capillary: 111 mg/dL — ABNORMAL HIGH (ref 65–99)

## 2014-11-27 LAB — BASIC METABOLIC PANEL
Anion gap: 4 — ABNORMAL LOW (ref 5–15)
BUN: 21 mg/dL — ABNORMAL HIGH (ref 6–20)
CALCIUM: 8.3 mg/dL — AB (ref 8.9–10.3)
CO2: 27 mmol/L (ref 22–32)
CREATININE: 0.72 mg/dL (ref 0.44–1.00)
Chloride: 105 mmol/L (ref 101–111)
GFR calc non Af Amer: >60 mL/min (ref >60)
Glucose, Bld: 114 mg/dL — ABNORMAL HIGH (ref 65–99)
Potassium: 4.4 mmol/L (ref 3.5–5.1)
SODIUM: 136 mmol/L (ref 135–145)

## 2014-11-27 LAB — PHOSPHORUS: PHOSPHORUS: 3.8 mg/dL (ref 2.5–4.6)

## 2014-11-27 LAB — MAGNESIUM: Magnesium: 2.1 mg/dL (ref 1.7–2.4)

## 2014-11-27 MED ORDER — TRACE MINERALS CR-CU-MN-SE-ZN 10-1000-500-60 MCG/ML IV SOLN
INTRAVENOUS | Status: AC
  Administered 2014-11-27: 17:00:00 via INTRAVENOUS
  Filled 2014-11-27: qty 1560

## 2014-11-27 MED ORDER — FAT EMULSION 20 % IV EMUL
240.0000 mL | INTRAVENOUS | Status: AC
  Administered 2014-11-27: 240 mL via INTRAVENOUS
  Filled 2014-11-27: qty 250

## 2014-11-27 NOTE — Progress Notes (Signed)
[facility] PROGRESS NOTE Interim History: 81 year-old with past medical history of essential hypertension who presents to the ED with a small bowel obstruction surgery was consulted she had been treated conservatively. Surgery was considering surgical intervention but a repeated abdominal x-ray on 11/22/2014 showed the contrast moving long. So NG tube was clamped and start her on diet which she did not tolerate an NG tube had to be restarted to intermittent suction within 24 hours.   Assessment/Plan: SBO (small bowel obstruction) due to adhesion: - NG tube d'cd, no further nausea or vomiting. - Appreciate surgery's assistance.  - She is tolerating her clears. - Cont TNA per surgery.  Bigeminy/ Trigeminy: - Continue Beta-blocker .  Essential hypertension: - Continue metoprolol IV for a HR > 110.  Hypomagnesemia/Hypokalemia: - Secondary to GI losses nasogastric tube output has decreased. Continue to monitor electrolytes. - KVO IV fluids. - I's continue to be stable.  Normocytic anemia - Mild drop likely dilutional continue to monitor.  Leukocytosis - Likely reactive, resolved.  Thrombocytopenia Resolved, patient on SCDs.  CAD (coronary artery disease), native coronary artery Asymptomatic, aspirin on hold.  Dyslipidemia: Resume statins.    Code Status: full Family Communication: daughter  Disposition Plan: Home in a.m.  Consultants:  Surgery  Cardiology  Procedures:  CXR  Abd x-ray's  CT abd and pelvis  Antibiotics:  none  HPI/Subjective: Tolerating diet denies abdominal pain.  Objective: Filed Vitals:   11/26/14 0602 11/26/14 1338 11/26/14 2142 11/27/14 0430  BP: 119/47 99/47 100/55 101/44  Pulse: 84 78 91 82  Temp: 98 F (36.7 C) 98.4 F (36.9 C) 98.3 F (36.8 C) 98.5 F (36.9 C)  TempSrc: Oral Oral Oral Oral  Resp: 20 16 16 18   Height:      Weight:      SpO2: 98% 100% 97% 99%    Intake/Output Summary (Last 24 hours) at  11/27/14 1030 Last data filed at 11/27/14 0505  Gross per 24 hour  Intake     10 ml  Output   1300 ml  Net  -1290 ml   Filed Weights   11/16/14 1348  Weight: 62.596 kg (138 lb)    Exam:  General: Alert, awake, oriented x3, in no acute distress.  HEENT: No bruits, no goiter.  Heart: Regular rate and rhythm. Lungs: Good air movement, clear, no rales. Abdomen: Soft, not tender to palpation, nondistended, positive bowel sounds.  Neuro: Grossly intact, nonfocal.   Data Reviewed: Basic Metabolic Panel:  Recent Labs Lab 11/22/14 0540 11/23/14 0610 11/24/14 0540 11/25/14 0556 11/27/14 0510  NA 139 137 134* 131* 136  K 4.2 4.0 4.6 4.5 4.4  CL 106 102 101 103 105  CO2 27 26 26 25 27   GLUCOSE 115* 121* 154* 139* 114*  BUN 7 5* 11 19 21*  CREATININE 0.81 0.82 0.82 0.73 0.72  CALCIUM 8.7* 9.1 8.5* 7.9* 8.3*  MG  --   --  1.6* 2.1 2.1  PHOS  --   --  3.3 2.7 3.8   Liver Function Tests:  Recent Labs Lab 11/24/14 0540 11/25/14 0556  AST 20 15  ALT 12* 10*  ALKPHOS 46 47  BILITOT 0.5 0.3  PROT 5.2* 4.8*  ALBUMIN 2.6* 2.4*   No results for input(s): LIPASE, AMYLASE in the last 168 hours. No results for input(s): AMMONIA in the last 168 hours. CBC:  Recent Labs Lab 11/21/14 1136 11/22/14 0540 11/24/14 0540  WBC 10.4 7.1 9.4  NEUTROABS  --   --  7.0  HGB 11.3* 11.1* 12.2  HCT 34.3* 33.1* 36.3  MCV 93.0 93.2 92.6  PLT 140* 144* 172   Cardiac Enzymes: No results for input(s): CKTOTAL, CKMB, CKMBINDEX, TROPONINI in the last 168 hours. BNP (last 3 results) No results for input(s): BNP in the last 8760 hours.  ProBNP (last 3 results) No results for input(s): PROBNP in the last 8760 hours.  CBG:  Recent Labs Lab 11/26/14 1717 11/26/14 2003 11/26/14 2339 11/27/14 0434 11/27/14 0801  GLUCAP 108* 103* 139* 118* 141*    No results found for this or any previous visit (from the past 240 hour(s)).   Studies: No results found.  Scheduled Meds: .  antiseptic oral rinse  7 mL Mouth Rinse q12n4p  . brimonidine  1 drop Both Eyes BID   And  . timolol  1 drop Both Eyes BID  . chlorhexidine  15 mL Mouth Rinse BID  . enoxaparin (LOVENOX) injection  40 mg Subcutaneous Q24H  . gabapentin  300 mg Oral BID  . insulin aspart  0-9 Units Subcutaneous 6 times per day  . latanoprost  1 drop Both Eyes QHS  . lubiprostone  24 mcg Oral BID WC  . pantoprazole (PROTONIX) IV  40 mg Intravenous Q12H  . simvastatin  40 mg Oral QPM  . traZODone  50 mg Oral QHS   Continuous Infusions: . Marland KitchenTPN (CLINIMIX-E) Adult 65 mL/hr at 11/26/14 1752   And  . fat emulsion 240 mL (11/26/14 1753)  . Marland KitchenTPN (CLINIMIX-E) Adult     And  . fat emulsion      Time Spent: 15 min   Charlynne Cousins  [facility] Pager [phone].  If 7PM-7AM, please contact night-coverage at [email or web address], password [facility] 11/27/2014, 10:30 AM  LOS: 12 days

## 2014-11-27 NOTE — Progress Notes (Signed)
[place] NOTE  Pharmacy Consult for TPN Indication: small bowel obstruction  No Known Allergies  Patient Measurements: Height: 5' (152.4 cm) Weight: 138 lb (62.596 kg) IBW/kg (Calculated) : 45.5  Adjusted body weight: 51kg  Vital Signs: Temp: 98.5 F (36.9 C) (10/01 0430) Temp Source: Oral (10/01 0430) BP: 101/44 mmHg (10/01 0430) Pulse Rate: 82 (10/01 0430) Intake/Output from previous day: 09/30 0701 - 10/01 0700 In: 10 [I.V.:10] Out: 1300 [Urine:1300] Intake/Output from this shift:    Labs: No results for input(s): WBC, HGB, HCT, PLT, APTT, INR in the last 72 hours.   Recent Labs  11/25/14 0556 11/27/14 0510  NA 131* 136  K 4.5 4.4  CL 103 105  CO2 25 27  GLUCOSE 139* 114*  BUN 19 21*  CREATININE 0.73 0.72  CALCIUM 7.9* 8.3*  MG 2.1 2.1  PHOS 2.7 3.8  PROT 4.8*  --   ALBUMIN 2.4*  --   AST 15  --   ALT 10*  --   ALKPHOS 47  --   BILITOT 0.3  --    Estimated Creatinine Clearance: 46.3 mL/min (by C-G formula based on Cr of 0.72).    Recent Labs  11/26/14 2339 11/27/14 0434 11/27/14 0801  GLUCAP 139* 118* 141*    Medical History: Past Medical History  Diagnosis Date  . Hiatal hernia   . Hypertension   . Heart disease   . Hypercholesterolemia   . Arthritis   . IBS (irritable bowel syndrome)   . GERD (gastroesophageal reflux disease)     Medications:  Scheduled:  . antiseptic oral rinse  7 mL Mouth Rinse q12n4p  . brimonidine  1 drop Both Eyes BID   And  . timolol  1 drop Both Eyes BID  . chlorhexidine  15 mL Mouth Rinse BID  . enoxaparin (LOVENOX) injection  40 mg Subcutaneous Q24H  . gabapentin  300 mg Oral BID  . insulin aspart  0-9 Units Subcutaneous 6 times per day  . latanoprost  1 drop Both Eyes QHS  . lubiprostone  24 mcg Oral BID WC  . pantoprazole (PROTONIX) IV  40 mg Intravenous Q12H  . simvastatin  40 mg Oral QPM  . traZODone  50 mg Oral QHS   Infusions:  . Marland KitchenTPN (CLINIMIX-E) Adult 65 mL/hr at  11/26/14 1752   And  . fat emulsion 240 mL (11/26/14 1753)   Insulin Requirements: 5 units Novolog over last 24 hours  Current Nutrition: CLD, no supplements ordered  IVF: none  Central access: PICC TPN start date: 9/27  ASSESSMENT                                                                                                          82yo F admitted w/ N/V and abd pain. CT revealed SBO. She failed conservative management. Now s/p LOA with laparotomy. Pharmacy is asked to start TPN.  Significant events:  9/28: To clamp NG tube today, will remain NPO.  9/29: Tolerated some clears but not advancing diet today, Possible ileus 9/30: Advanced  to FLD  Today:   Glucose -at goal < 150  Electrolytes - WNL  Renal - SCr wnl; CrCl low d/t age  LFTs - wnl, albumin low  TGs - wnl  Prealbumin - 9.1 (9/28)  NUTRITIONAL GOALS                                                                                             RD recs: 1550-1750 Kcal/d and 70-80 g/d protein, 2-2.2 L/d fluid. Clinimix E 5/15 at a goal rate of 65 ml/hr + 20% fat emulsion at 10 ml/hr to provide: 78 g/day protein, 1588 Kcal/day.  PLAN                                                                                                                         At 1800 today:  Continue Clinimix E 5/15 at goal rate of 65 ml/hr.  Continue 20% fat emulsion at 10 ml/hr.  TPN to contain standard multivitamins and trace elements.  IVF per MD/surgical management  Continue sens SSI q4h  TPN lab panels on Mondays & Thursdays.  F/u daily  Ralene Bathe, PharmD, BCPS 11/27/2014, 10:10 AM  Pager: [phone]

## 2014-11-27 NOTE — Progress Notes (Signed)
Patient ID: Leslie Frey, female   DOB: 07-12-1932, 81 y.o.   MRN: [id number] [facility] Surgery Progress Note:   4 Days Post-Op  Subjective: Mental status is clear.   Objective: Vital signs in last 24 hours: Temp:  [98.3 F (36.8 C)-98.5 F (36.9 C)] 98.5 F (36.9 C) (10/01 0430) Pulse Rate:  [78-91] 82 (10/01 0430) Resp:  [16-18] 18 (10/01 0430) BP: (99-101)/(44-55) 101/44 mmHg (10/01 0430) SpO2:  [97 %-100 %] 99 % (10/01 0430)  Intake/Output from previous day: 09/30 0701 - 10/01 0700 In: 10 [I.V.:10] Out: 1300 [Urine:1300] Intake/Output this shift:    Physical Exam: Work of breathing is normal.  No flatus yet.    Lab Results:  Results for orders placed or performed during the hospital encounter of 11/15/14 (from the past 48 hour(s))  Glucose, capillary     Status: Abnormal   Collection Time: 11/25/14 12:07 PM  Result Value Ref Range   Glucose-Capillary 158 (H) 65 - 99 mg/dL  Glucose, capillary     Status: Abnormal   Collection Time: 11/25/14  5:36 PM  Result Value Ref Range   Glucose-Capillary 123 (H) 65 - 99 mg/dL  Glucose, capillary     Status: Abnormal   Collection Time: 11/25/14  8:27 PM  Result Value Ref Range   Glucose-Capillary 131 (H) 65 - 99 mg/dL  Glucose, capillary     Status: Abnormal   Collection Time: 11/25/14 11:58 PM  Result Value Ref Range   Glucose-Capillary 134 (H) 65 - 99 mg/dL  Glucose, capillary     Status: Abnormal   Collection Time: 11/26/14  4:18 AM  Result Value Ref Range   Glucose-Capillary 135 (H) 65 - 99 mg/dL  Glucose, capillary     Status: Abnormal   Collection Time: 11/26/14  7:41 AM  Result Value Ref Range   Glucose-Capillary 152 (H) 65 - 99 mg/dL  Glucose, capillary     Status: Abnormal   Collection Time: 11/26/14 11:32 AM  Result Value Ref Range   Glucose-Capillary 149 (H) 65 - 99 mg/dL  Glucose, capillary     Status: Abnormal   Collection Time: 11/26/14  5:17 PM  Result Value Ref Range   Glucose-Capillary 108 (H) 65 -  99 mg/dL  Glucose, capillary     Status: Abnormal   Collection Time: 11/26/14  8:03 PM  Result Value Ref Range   Glucose-Capillary 103 (H) 65 - 99 mg/dL  Glucose, capillary     Status: Abnormal   Collection Time: 11/26/14 11:39 PM  Result Value Ref Range   Glucose-Capillary 139 (H) 65 - 99 mg/dL  Glucose, capillary     Status: Abnormal   Collection Time: 11/27/14  4:34 AM  Result Value Ref Range   Glucose-Capillary 118 (H) 65 - 99 mg/dL  Basic metabolic panel     Status: Abnormal   Collection Time: 11/27/14  5:10 AM  Result Value Ref Range   Sodium 136 135 - 145 mmol/L   Potassium 4.4 3.5 - 5.1 mmol/L   Chloride 105 101 - 111 mmol/L   CO2 27 22 - 32 mmol/L   Glucose, Bld 114 (H) 65 - 99 mg/dL   BUN 21 (H) 6 - 20 mg/dL   Creatinine, Ser 0.72 0.44 - 1.00 mg/dL   Calcium 8.3 (L) 8.9 - 10.3 mg/dL   GFR calc non Af Amer >60 >60 mL/min   GFR calc Af Amer >60 >60 mL/min    Comment: (NOTE) The eGFR has been calculated using the  CKD EPI equation. This calculation has not been validated in all clinical situations. eGFR's persistently <60 mL/min signify possible Chronic Kidney Disease.    Anion gap 4 (L) 5 - 15  Magnesium     Status: None   Collection Time: 11/27/14  5:10 AM  Result Value Ref Range   Magnesium 2.1 1.7 - 2.4 mg/dL  Phosphorus     Status: None   Collection Time: 11/27/14  5:10 AM  Result Value Ref Range   Phosphorus 3.8 2.5 - 4.6 mg/dL  Glucose, capillary     Status: Abnormal   Collection Time: 11/27/14  8:01 AM  Result Value Ref Range   Glucose-Capillary 141 (H) 65 - 99 mg/dL    Radiology/Results: No results found.  Anti-infectives: Anti-infectives    Start     Dose/Rate Route Frequency Ordered Stop   11/23/14 1100  cefTRIAXone (ROCEPHIN) 2 g in dextrose 5 % 50 mL IVPB     2 g 100 mL/hr over 30 Minutes Intravenous On call to O.R. 11/23/14 1059 11/23/14 1258      Assessment/Plan: Problem List: Patient Active Problem List   Diagnosis Date Noted  .  Bigeminy 11/23/2014  . Trigeminy 11/23/2014  . Essential hypertension 11/21/2014  . Hypomagnesemia 11/18/2014  . Normocytic anemia 11/18/2014  . Hypokalemia 11/17/2014  . Leukocytosis 11/17/2014  . Thrombocytopenia 11/17/2014  . Nausea and vomiting 11/16/2014  . Dyslipidemia 11/16/2014  . SBO (small bowel obstruction) 11/15/2014  . CAD (coronary artery disease), native coronary artery 11/15/2014    No flatus;  Slow progress.   4 Days Post-Op    LOS: 12 days   Matt B. Hassell Done, MD, [facility] [phone] beeper [phone]  11/27/2014 10:04 AM

## 2014-11-28 DIAGNOSIS — E440 Moderate protein-calorie malnutrition: Secondary | ICD-10-CM

## 2014-11-28 LAB — GLUCOSE, CAPILLARY
GLUCOSE-CAPILLARY: 128 mg/dL — AB (ref 65–99)
GLUCOSE-CAPILLARY: 127 mg/dL — AB (ref 65–99)
GLUCOSE-CAPILLARY: 113 mg/dL — AB (ref 65–99)
Glucose-Capillary: 135 mg/dL — ABNORMAL HIGH (ref 65–99)
Glucose-Capillary: 136 mg/dL — ABNORMAL HIGH (ref 65–99)

## 2014-11-28 MED ORDER — ENSURE ENLIVE PO LIQD
237.0000 mL | Freq: Two times a day (BID) | ORAL | Status: DC
  Administered 2014-11-28 – 2014-11-30 (×5): 237 mL via ORAL

## 2014-11-28 MED ORDER — INSULIN ASPART 100 UNIT/ML ~~LOC~~ SOLN
0.0000 [IU] | Freq: Four times a day (QID) | SUBCUTANEOUS | Status: DC
  Administered 2014-11-28 – 2014-11-29 (×3): 1 [IU] via SUBCUTANEOUS

## 2014-11-28 MED ORDER — FAT EMULSION 20 % IV EMUL
240.0000 mL | INTRAVENOUS | Status: AC
  Administered 2014-11-28: 240 mL via INTRAVENOUS
  Filled 2014-11-28: qty 250

## 2014-11-28 MED ORDER — TRACE MINERALS CR-CU-MN-SE-ZN 10-1000-500-60 MCG/ML IV SOLN
INTRAVENOUS | Status: AC
  Administered 2014-11-28: 18:00:00 via INTRAVENOUS
  Filled 2014-11-28: qty 720

## 2014-11-28 NOTE — Progress Notes (Signed)
[place] NOTE  Pharmacy Consult for TPN Indication: small bowel obstruction  No Known Allergies  Patient Measurements: Height: 5' (152.4 cm) Weight: 138 lb (62.596 kg) IBW/kg (Calculated) : 45.5  Adjusted body weight: 51kg  Vital Signs: Temp: 98.6 F (37 C) (10/02 0512) Temp Source: Oral (10/02 0512) BP: 114/58 mmHg (10/02 0512) Pulse Rate: 82 (10/02 0512) Intake/Output from previous day: 10/01 0701 - 10/02 0700 In: 360 [P.O.:360] Out: -  Intake/Output from this shift: Total I/O In: 120 [P.O.:120] Out: -   Labs: No results for input(s): WBC, HGB, HCT, PLT, APTT, INR in the last 72 hours.   Recent Labs  11/27/14 0510  NA 136  K 4.4  CL 105  CO2 27  GLUCOSE 114*  BUN 21*  CREATININE 0.72  CALCIUM 8.3*  MG 2.1  PHOS 3.8   Estimated Creatinine Clearance: 46.3 mL/min (by C-G formula based on Cr of 0.72).    Recent Labs  11/27/14 2356 11/28/14 0407 11/28/14 0801  GLUCAP 136* 128* 127*    Medical History: Past Medical History  Diagnosis Date  . Hiatal hernia   . Hypertension   . Heart disease   . Hypercholesterolemia   . Arthritis   . IBS (irritable bowel syndrome)   . GERD (gastroesophageal reflux disease)     Medications:  Scheduled:  . antiseptic oral rinse  7 mL Mouth Rinse q12n4p  . brimonidine  1 drop Both Eyes BID   And  . timolol  1 drop Both Eyes BID  . chlorhexidine  15 mL Mouth Rinse BID  . enoxaparin (LOVENOX) injection  40 mg Subcutaneous Q24H  . feeding supplement (ENSURE ENLIVE)  237 mL Oral BID BM  . gabapentin  300 mg Oral BID  . insulin aspart  0-9 Units Subcutaneous 6 times per day  . latanoprost  1 drop Both Eyes QHS  . lubiprostone  24 mcg Oral BID WC  . pantoprazole (PROTONIX) IV  40 mg Intravenous Q12H  . simvastatin  40 mg Oral QPM  . traZODone  50 mg Oral QHS   Infusions:  . Marland KitchenTPN (CLINIMIX-E) Adult 65 mL/hr at 11/27/14 1729   And  . fat emulsion 240 mL (11/27/14 1729)   Insulin  Requirements: 5 units Novolog over last 24 hours  Current Nutrition: FLD (100% meals charted), no supplements ordered  IVF: none  Central access: PICC TPN start date: 9/27  ASSESSMENT                                                                                                          81yo F admitted w/ N/V and abd pain. CT revealed SBO. She failed conservative management. Now s/p LOA with laparotomy. Pharmacy is asked to start TPN.  Significant events:  9/28: To clamp NG tube today, will remain NPO.  9/29: Tolerated some clears but not advancing diet today, Possible ileus 9/30: Advanced to FLD 10/1: Having bowel function, advanced to regular diet.  Per surgery, cut TPN rate in 1/2 tonight with plan to stop tomorrow if doing well  Today:   Glucose -at goal < 150  Electrolytes - WNL  Renal - SCr wnl; CrCl low d/t age  LFTs - wnl, albumin low  TGs - wnl  Prealbumin - 9.1 (9/28)  NUTRITIONAL GOALS                                                                                             RD recs: 1550-1750 Kcal/d and 70-80 g/d protein, 2-2.2 L/d fluid. Clinimix E 5/15 at a goal rate of 65 ml/hr + 20% fat emulsion at 10 ml/hr to provide: 78 g/day protein, 1588 Kcal/day.  PLAN                                                                                                                         At 1800 today:  Decrease to Clinimix E 5/15 at 30 ml/hr.  Continue 20% fat emulsion at 10 ml/hr.  TPN to contain standard multivitamins and trace elements.  IVF per MD/surgical management  Change to sens SSI q6h  TPN lab panels on Mondays & Thursdays.  F/u plans to stop TPN tomorrow if pt doing well  Ralene Bathe, PharmD, BCPS 11/28/2014, 10:31 AM  Pager: [phone]

## 2014-11-28 NOTE — Progress Notes (Signed)
Patient ID: Leslie Frey, female   DOB: 08-Nov-1932, 81 y.o.   MRN: [id number] [facility] Surgery Progress Note:   4 Days Post-Op  Subjective: Doing well.  Denies nausea.  Having bowel function Objective: Vital signs in last 24 hours: Temp:  [97.8 F (36.6 C)-98.6 F (37 C)] 98.6 F (37 C) (10/02 0512) Pulse Rate:  [68-84] 82 (10/02 0512) Resp:  [17-18] 18 (10/02 0512) BP: (104-115)/(56-68) 114/58 mmHg (10/02 0512) SpO2:  [97 %-100 %] 99 % (10/02 0512)  Intake/Output from previous day: 10/01 0701 - 10/02 0700 In: 360 [P.O.:360] Out: -  Intake/Output this shift: Total I/O In: 120 [P.O.:120] Out: -   Physical Exam: Work of breathing is normal.  Abd soft.  Incisions clean, dry, intact    Lab Results:  Results for orders placed or performed during the hospital encounter of 11/15/14 (from the past 48 hour(s))  Glucose, capillary     Status: Abnormal   Collection Time: 11/26/14 11:32 AM  Result Value Ref Range   Glucose-Capillary 149 (H) 65 - 99 mg/dL  Glucose, capillary     Status: Abnormal   Collection Time: 11/26/14  5:17 PM  Result Value Ref Range   Glucose-Capillary 108 (H) 65 - 99 mg/dL  Glucose, capillary     Status: Abnormal   Collection Time: 11/26/14  8:03 PM  Result Value Ref Range   Glucose-Capillary 103 (H) 65 - 99 mg/dL  Glucose, capillary     Status: Abnormal   Collection Time: 11/26/14 11:39 PM  Result Value Ref Range   Glucose-Capillary 139 (H) 65 - 99 mg/dL  Glucose, capillary     Status: Abnormal   Collection Time: 11/27/14  4:34 AM  Result Value Ref Range   Glucose-Capillary 118 (H) 65 - 99 mg/dL  Basic metabolic panel     Status: Abnormal   Collection Time: 11/27/14  5:10 AM  Result Value Ref Range   Sodium 136 135 - 145 mmol/L   Potassium 4.4 3.5 - 5.1 mmol/L   Chloride 105 101 - 111 mmol/L   CO2 27 22 - 32 mmol/L   Glucose, Bld 114 (H) 65 - 99 mg/dL   BUN 21 (H) 6 - 20 mg/dL   Creatinine, Ser 0.72 0.44 - 1.00 mg/dL   Calcium 8.3 (L) 8.9 -  10.3 mg/dL   GFR calc non Af Amer >60 >60 mL/min   GFR calc Af Amer >60 >60 mL/min    Comment: (NOTE) The eGFR has been calculated using the CKD EPI equation. This calculation has not been validated in all clinical situations. eGFR's persistently <60 mL/min signify possible Chronic Kidney Disease.    Anion gap 4 (L) 5 - 15  Magnesium     Status: None   Collection Time: 11/27/14  5:10 AM  Result Value Ref Range   Magnesium 2.1 1.7 - 2.4 mg/dL  Phosphorus     Status: None   Collection Time: 11/27/14  5:10 AM  Result Value Ref Range   Phosphorus 3.8 2.5 - 4.6 mg/dL  Glucose, capillary     Status: Abnormal   Collection Time: 11/27/14  8:01 AM  Result Value Ref Range   Glucose-Capillary 141 (H) 65 - 99 mg/dL  Glucose, capillary     Status: Abnormal   Collection Time: 11/27/14 12:36 PM  Result Value Ref Range   Glucose-Capillary 127 (H) 65 - 99 mg/dL  Glucose, capillary     Status: Abnormal   Collection Time: 11/27/14  8:10 PM  Result Value  Ref Range   Glucose-Capillary 111 (H) 65 - 99 mg/dL  Glucose, capillary     Status: Abnormal   Collection Time: 11/27/14 11:56 PM  Result Value Ref Range   Glucose-Capillary 136 (H) 65 - 99 mg/dL  Glucose, capillary     Status: Abnormal   Collection Time: 11/28/14  4:07 AM  Result Value Ref Range   Glucose-Capillary 128 (H) 65 - 99 mg/dL  Glucose, capillary     Status: Abnormal   Collection Time: 11/28/14  8:01 AM  Result Value Ref Range   Glucose-Capillary 127 (H) 65 - 99 mg/dL    Radiology/Results: No results found.  Anti-infectives: Anti-infectives    Start     Dose/Rate Route Frequency Ordered Stop   11/23/14 1100  cefTRIAXone (ROCEPHIN) 2 g in dextrose 5 % 50 mL IVPB     2 g 100 mL/hr over 30 Minutes Intravenous On call to O.R. 11/23/14 1059 11/23/14 1258      Assessment/Plan: Problem List: Patient Active Problem List   Diagnosis Date Noted  . Moderate protein-calorie malnutrition ([place]) 11/28/2014  . Bigeminy 11/23/2014   . Trigeminy 11/23/2014  . Essential hypertension 11/21/2014  . Hypomagnesemia 11/18/2014  . Normocytic anemia 11/18/2014  . Hypokalemia 11/17/2014  . Leukocytosis 11/17/2014  . Thrombocytopenia ([place]) 11/17/2014  . Nausea and vomiting 11/16/2014  . Dyslipidemia 11/16/2014  . SBO (small bowel obstruction) ([place]) 11/15/2014  . CAD (coronary artery disease), native coronary artery 11/15/2014    Seems to be opening up.  Advance to reg diet.  1/2 rate TPN tonight then off if still doing well tom.  Hopefully home on Tues  4 Days Post-Op lap LOA   LOS: 13 days   Rosario Adie, MD  [facility] [place] [facility]   11/28/2014 10:27 AM

## 2014-11-28 NOTE — Progress Notes (Signed)
[facility] PROGRESS NOTE Interim History: 81 year-old with past medical history of essential hypertension who presents to the ED with a small bowel obstruction surgery was consulted she had been treated conservatively. Surgery was considering surgical intervention but a repeated abdominal x-ray on 11/22/2014 showed the contrast moving long. So NG tube was clamped and start her on diet which she did not tolerate an NG tube had to be restarted to intermittent suction within 24 hours.   Assessment/Plan: SBO (small bowel obstruction) due to adhesion: - NG tube d'cd, no further nausea or vomiting. - Appreciate surgery's assistance.  - Had one bowel movement overnight her diet was advanced. We'll continue on diet and TPN for at least 2 days.  Bigeminy/ Trigeminy: - Continue Beta-blocker .  Essential hypertension: - Continue metoprolol IV for a HR > 110.  Hypomagnesemia/Hypokalemia: - Secondary to GI losses nasogastric tube output has decreased. Continue to monitor electrolytes. - KVO IV fluids. - I's continue to be stable.  Normocytic anemia - Mild drop likely dilutional.  Leukocytosis - Likely reactive, resolved.  Thrombocytopenia Resolved, patient on SCDs.  CAD (coronary artery disease), native coronary artery Asymptomatic, aspirin on hold.  Dyslipidemia: Resume statins.   Moderate protein caloric malnutrition; Continue T&A her diet has been advanced to regular diet. We'll as ensure 3 times a day. Code Status: full Family Communication: daughter  Disposition Plan: Home on Tuesday  Consultants:  Surgery  Cardiology  Procedures:  CXR  Abd x-ray's  CT abd and pelvis  Antibiotics:  none  HPI/Subjective: Tolerating diet denies abdominal pain. At one bowel movement overnight. She relates she feels better.  Objective: Filed Vitals:   11/27/14 0430 11/27/14 1500 11/27/14 2145 11/28/14 0512  BP: 101/44 115/68 104/56 114/58  Pulse: 82 68 84 82   Temp: 98.5 F (36.9 C) 97.8 F (36.6 C) 98.2 F (36.8 C) 98.6 F (37 C)  TempSrc: Oral Oral Oral Oral  Resp: 18 18 17 18   Height:      Weight:      SpO2: 99% 97% 100% 99%    Intake/Output Summary (Last 24 hours) at 11/28/14 0950 Last data filed at 11/27/14 2146  Gross per 24 hour  Intake    240 ml  Output      0 ml  Net    240 ml   Filed Weights   11/16/14 1348  Weight: 62.596 kg (138 lb)    Exam:  General: Alert, awake, oriented x3, in no acute distress. Cachectic appearing. HEENT: No bruits, no goiter.  Heart: Regular rate and rhythm. Lungs: Good air movement, clea. Abdomen: Soft, not tender to palpation, nondistended, positive bowel sounds.     Data Reviewed: Basic Metabolic Panel:  Recent Labs Lab 11/22/14 0540 11/23/14 0610 11/24/14 0540 11/25/14 0556 11/27/14 0510  NA 139 137 134* 131* 136  K 4.2 4.0 4.6 4.5 4.4  CL 106 102 101 103 105  CO2 27 26 26 25 27   GLUCOSE 115* 121* 154* 139* 114*  BUN 7 5* 11 19 21*  CREATININE 0.81 0.82 0.82 0.73 0.72  CALCIUM 8.7* 9.1 8.5* 7.9* 8.3*  MG  --   --  1.6* 2.1 2.1  PHOS  --   --  3.3 2.7 3.8   Liver Function Tests:  Recent Labs Lab 11/24/14 0540 11/25/14 0556  AST 20 15  ALT 12* 10*  ALKPHOS 46 47  BILITOT 0.5 0.3  PROT 5.2* 4.8*  ALBUMIN 2.6* 2.4*   No results for input(s): LIPASE,  AMYLASE in the last 168 hours. No results for input(s): AMMONIA in the last 168 hours. CBC:  Recent Labs Lab 11/21/14 1136 11/22/14 0540 11/24/14 0540  WBC 10.4 7.1 9.4  NEUTROABS  --   --  7.0  HGB 11.3* 11.1* 12.2  HCT 34.3* 33.1* 36.3  MCV 93.0 93.2 92.6  PLT 140* 144* 172   Cardiac Enzymes: No results for input(s): CKTOTAL, CKMB, CKMBINDEX, TROPONINI in the last 168 hours. BNP (last 3 results) No results for input(s): BNP in the last 8760 hours.  ProBNP (last 3 results) No results for input(s): PROBNP in the last 8760 hours.  CBG:  Recent Labs Lab 11/27/14 1236 11/27/14 2010 11/27/14 2356  11/28/14 0407 11/28/14 0801  GLUCAP 127* 111* 136* 128* 127*    No results found for this or any previous visit (from the past 240 hour(s)).   Studies: No results found.  Scheduled Meds: . antiseptic oral rinse  7 mL Mouth Rinse q12n4p  . brimonidine  1 drop Both Eyes BID   And  . timolol  1 drop Both Eyes BID  . chlorhexidine  15 mL Mouth Rinse BID  . enoxaparin (LOVENOX) injection  40 mg Subcutaneous Q24H  . gabapentin  300 mg Oral BID  . insulin aspart  0-9 Units Subcutaneous 6 times per day  . latanoprost  1 drop Both Eyes QHS  . lubiprostone  24 mcg Oral BID WC  . pantoprazole (PROTONIX) IV  40 mg Intravenous Q12H  . simvastatin  40 mg Oral QPM  . traZODone  50 mg Oral QHS   Continuous Infusions: . Marland KitchenTPN (CLINIMIX-E) Adult 65 mL/hr at 11/27/14 1729   And  . fat emulsion 240 mL (11/27/14 1729)    Time Spent: 15 min   Charlynne Cousins  [facility] Pager [phone].  If 7PM-7AM, please contact night-coverage at [email or web address], password [facility] 11/28/2014, 9:50 AM  LOS: 13 days

## 2014-11-29 LAB — COMPREHENSIVE METABOLIC PANEL
ALT: 42 U/L (ref 14–54)
ANION GAP: 4 — AB (ref 5–15)
AST: 48 U/L — ABNORMAL HIGH (ref 15–41)
Albumin: 2.4 g/dL — ABNORMAL LOW (ref 3.5–5.0)
Alkaline Phosphatase: 60 U/L (ref 38–126)
BUN: 19 mg/dL (ref 6–20)
CHLORIDE: 105 mmol/L (ref 101–111)
CO2: 27 mmol/L (ref 22–32)
CREATININE: 0.73 mg/dL (ref 0.44–1.00)
Calcium: 8.2 mg/dL — ABNORMAL LOW (ref 8.9–10.3)
GFR calc non Af Amer: >60 mL/min (ref >60)
Glucose, Bld: 124 mg/dL — ABNORMAL HIGH (ref 65–99)
POTASSIUM: 4.1 mmol/L (ref 3.5–5.1)
SODIUM: 136 mmol/L (ref 135–145)
Total Bilirubin: 0.3 mg/dL (ref 0.3–1.2)
Total Protein: 5.1 g/dL — ABNORMAL LOW (ref 6.5–8.1)

## 2014-11-29 LAB — DIFFERENTIAL
BASOS ABS: 0 10*3/uL (ref 0.0–0.1)
Basophils Relative: 0 %
Eosinophils Absolute: 0.1 10*3/uL (ref 0.0–0.7)
Eosinophils Relative: 2 %
LYMPHS ABS: 2 10*3/uL (ref 0.7–4.0)
LYMPHS PCT: 29 %
MONOS PCT: 12 %
Monocytes Absolute: 0.8 10*3/uL (ref 0.1–1.0)
NEUTROS ABS: 3.9 10*3/uL (ref 1.7–7.7)
NEUTROS PCT: 57 %

## 2014-11-29 LAB — GLUCOSE, CAPILLARY
GLUCOSE-CAPILLARY: 102 mg/dL — AB (ref 65–99)
GLUCOSE-CAPILLARY: 109 mg/dL — AB (ref 65–99)
GLUCOSE-CAPILLARY: 121 mg/dL — AB (ref 65–99)
GLUCOSE-CAPILLARY: 131 mg/dL — AB (ref 65–99)
Glucose-Capillary: 119 mg/dL — ABNORMAL HIGH (ref 65–99)

## 2014-11-29 LAB — CBC
HEMATOCRIT: 27.9 % — AB (ref 36.0–46.0)
HEMOGLOBIN: 9.3 g/dL — AB (ref 12.0–15.0)
MCH: 30.6 pg (ref 26.0–34.0)
MCHC: 33.3 g/dL (ref 30.0–36.0)
MCV: 91.8 fL (ref 78.0–100.0)
Platelets: 211 10*3/uL (ref 150–400)
RBC: 3.04 MIL/uL — AB (ref 3.87–5.11)
RDW: 15.5 % (ref 11.5–15.5)
WBC: 6.9 10*3/uL (ref 4.0–10.5)

## 2014-11-29 LAB — TRIGLYCERIDES: Triglycerides: 76 mg/dL (ref <150)

## 2014-11-29 LAB — MAGNESIUM: MAGNESIUM: 2 mg/dL (ref 1.7–2.4)

## 2014-11-29 LAB — PREALBUMIN: PREALBUMIN: 11.1 mg/dL — AB (ref 18–38)

## 2014-11-29 LAB — PHOSPHORUS: PHOSPHORUS: 3.3 mg/dL (ref 2.5–4.6)

## 2014-11-29 NOTE — Progress Notes (Signed)
[facility] PROGRESS NOTE Interim History: 81 year-old with past medical history of essential hypertension who presents to the ED with a small bowel obstruction surgery was consulted she had been treated conservatively. Surgery was considering surgical intervention but a repeated abdominal x-ray on 11/22/2014 showed the contrast moving long. So NG tube was clamped and start her on diet which she did not tolerate an NG tube had to be restarted to intermittent suction within 24 hours.   Assessment/Plan: SBO (small bowel obstruction) due to adhesion: - Appreciate surgery's assistance.  - Continue to have regular bowel movement, TNA will be stopped today. - Lower regular diet.  Bigeminy/ Trigeminy: - Continue Beta-blocker .  Essential hypertension: - Blood pressure continues to be stable continue to monitor.  Hypomagnesemia/Hypokalemia: - Electrolytes are stable.  Normocytic anemia - Mild drop likely dilutional.  Leukocytosis - Likely reactive, resolved.  Thrombocytopenia Resolved, patient on SCDs.  CAD (coronary artery disease), native coronary artery Asymptomatic, aspirin on hold.  Dyslipidemia: Resume statins.   Moderate protein caloric malnutrition; Continue T&A her diet has been advanced to regular diet. We'll as ensure 3 times a day. Code Status: full Family Communication: daughter  Disposition Plan: Home on Tuesday  Consultants:  Surgery  Cardiology  Procedures:  CXR  Abd x-ray's  CT abd and pelvis  Antibiotics:  none  HPI/Subjective: No complaints ready to go home.  Objective: Filed Vitals:   11/28/14 0512 11/28/14 1400 11/28/14 2113 11/29/14 0702  BP: 114/58 110/59 102/64 108/43  Pulse: 82 82 91 80  Temp: 98.6 F (37 C) 98.3 F (36.8 C) 98.3 F (36.8 C) 98.3 F (36.8 C)  TempSrc: Oral Oral Oral Oral  Resp: 18 20 19 18   Height:      Weight:      SpO2: 99% 100% 99% 98%    Intake/Output Summary (Last 24 hours) at 11/29/14  0931 Last data filed at 11/29/14 0912  Gross per 24 hour  Intake    600 ml  Output      0 ml  Net    600 ml   Filed Weights   11/16/14 1348  Weight: 62.596 kg (138 lb)    Exam:  General: Alert, awake, oriented x3, Cachectic appearing. HEENT: No bruits, no goiter.  Heart: Regular rate and rhythm. Lungs: Good air movement, clear. Abdomen: Soft, not tender to palpation, nondistended, positive bowel sounds.     Data Reviewed: Basic Metabolic Panel:  Recent Labs Lab 11/23/14 0610 11/24/14 0540 11/25/14 0556 11/27/14 0510 11/29/14 0410  NA 137 134* 131* 136 136  K 4.0 4.6 4.5 4.4 4.1  CL 102 101 103 105 105  CO2 26 26 25 27 27   GLUCOSE 121* 154* 139* 114* 124*  BUN 5* 11 19 21* 19  CREATININE 0.82 0.82 0.73 0.72 0.73  CALCIUM 9.1 8.5* 7.9* 8.3* 8.2*  MG  --  1.6* 2.1 2.1 2.0  PHOS  --  3.3 2.7 3.8 3.3   Liver Function Tests:  Recent Labs Lab 11/24/14 0540 11/25/14 0556 11/29/14 0410  AST 20 15 48*  ALT 12* 10* 42  ALKPHOS 46 47 60  BILITOT 0.5 0.3 0.3  PROT 5.2* 4.8* 5.1*  ALBUMIN 2.6* 2.4* 2.4*   No results for input(s): LIPASE, AMYLASE in the last 168 hours. No results for input(s): AMMONIA in the last 168 hours. CBC:  Recent Labs Lab 11/24/14 0540 11/29/14 0410  WBC 9.4 6.9  NEUTROABS 7.0 3.9  HGB 12.2 9.3*  HCT 36.3 27.9*  MCV 92.6 91.8  PLT 172 211   Cardiac Enzymes: No results for input(s): CKTOTAL, CKMB, CKMBINDEX, TROPONINI in the last 168 hours. BNP (last 3 results) No results for input(s): BNP in the last 8760 hours.  ProBNP (last 3 results) No results for input(s): PROBNP in the last 8760 hours.  CBG:  Recent Labs Lab 11/28/14 0801 11/28/14 1225 11/28/14 1735 11/29/14 0017 11/29/14 0634  GLUCAP 127* 113* 135* 131* 119*    No results found for this or any previous visit (from the past 240 hour(s)).   Studies: No results found.  Scheduled Meds: . antiseptic oral rinse  7 mL Mouth Rinse q12n4p  . brimonidine  1 drop  Both Eyes BID   And  . timolol  1 drop Both Eyes BID  . chlorhexidine  15 mL Mouth Rinse BID  . enoxaparin (LOVENOX) injection  40 mg Subcutaneous Q24H  . feeding supplement (ENSURE ENLIVE)  237 mL Oral BID BM  . gabapentin  300 mg Oral BID  . insulin aspart  0-9 Units Subcutaneous 4 times per day  . latanoprost  1 drop Both Eyes QHS  . lubiprostone  24 mcg Oral BID WC  . pantoprazole (PROTONIX) IV  40 mg Intravenous Q12H  . simvastatin  40 mg Oral QPM  . traZODone  50 mg Oral QHS   Continuous Infusions: . Marland KitchenTPN (CLINIMIX-E) Adult 30 mL/hr at 11/28/14 1740   And  . fat emulsion 240 mL (11/28/14 1739)    Time Spent: 15 min   Leslie Frey  [facility] Pager [phone].  If 7PM-7AM, please contact night-coverage at [email or web address], password [facility] 11/29/2014, 9:31 AM  LOS: 14 days

## 2014-11-29 NOTE — Progress Notes (Signed)
Physical Therapy Treatment Patient Details Name: Rheanna Sergent MRN: [id number] DOB: April 16, 1932 Today's Date: 11/29/2014    History of Present Illness 81 yo female admitted with SBO. Hx of HTn, hiatal hernia, MI    PT Comments    Progressing well with mobility.   Follow Up Recommendations  No PT follow up;Supervision for mobility/OOB     Equipment Recommendations  None recommended by PT    Recommendations for Other Services       Precautions / Restrictions Precautions Precautions: Fall Restrictions Weight Bearing Restrictions: No    Mobility  Bed Mobility               General bed mobility comments: oob in recliner  Transfers     Transfers: Sit to/from Stand Sit to Stand: Supervision         General transfer comment: supervision for safety. Increased time  Ambulation/Gait Ambulation/Gait assistance: Min guard Ambulation Distance (Feet): 350 Feet Assistive device: None       General Gait Details: close guard for safety. slow gait speed.    Stairs            Wheelchair Mobility    Modified Rankin (Stroke Patients Only)       Balance                                    Cognition Arousal/Alertness: Awake/alert Behavior During Therapy: WFL for tasks assessed/performed Overall Cognitive Status: Within Functional Limits for tasks assessed                      Exercises      General Comments        Pertinent Vitals/Pain Pain Assessment: No/denies pain    Home Living                      Prior Function            PT Goals (current goals can now be found in the care plan section) Progress towards PT goals: Progressing toward goals    Frequency  Min 3X/week    PT Plan Current plan remains appropriate    Co-evaluation             End of Session   Activity Tolerance: Patient tolerated treatment well Patient left: in chair;with call bell/phone within reach     Time: 1441-1450 PT  Time Calculation (min) (ACUTE ONLY): 9 min  Charges:  $Gait Training: 8-22 mins                    G Codes:      Weston Anna, MPT Pager: [phone]

## 2014-11-29 NOTE — Care Management Note (Signed)
Case Management Note  Patient Details  Name: Leslie Frey MRN: [id number] Date of Birth: 1933/02/11  Subjective/Objective: 81 y/o f admitted w/SBO. D/c TNA.From home. PT-no f/u.                  Action/Plan:d/c home.   Expected Discharge Date:   (unknown)               Expected Discharge Plan:  Home/Self Care  In-House Referral:     Discharge planning Services  CM Consult  Post Acute Care Choice:    Choice offered to:     DME Arranged:    DME Agency:     HH Arranged:    HH Agency:     Status of Service:  In process, will continue to follow  Medicare Important Message Given:  Yes-fourth notification given Date Medicare IM Given:    Medicare IM give by:    Date Additional Medicare IM Given:    Additional Medicare Important Message give by:     If discussed at [place] of Stay Meetings, dates discussed:    Additional Comments:  Dessa Phi, RN 11/29/2014, 9:20 PM

## 2014-11-29 NOTE — Progress Notes (Signed)
Patient ID: Leslie Frey, female   DOB: 23-Nov-1932, 81 y.o.   MRN: [id number] 6 Days Post-Op  Subjective: No C/O this AM.  Tol reg diet without difficulty but not much appetite.  Denies pain or nausea.  Has had BMs.  Objective: Vital signs in last 24 hours: Temp:  [98.3 F (36.8 C)] 98.3 F (36.8 C) (10/03 0702) Pulse Rate:  [80-91] 80 (10/03 0702) Resp:  [18-20] 18 (10/03 0702) BP: (102-110)/(43-64) 108/43 mmHg (10/03 0702) SpO2:  [98 %-100 %] 98 % (10/03 0702) Last BM Date: 11/28/14  Intake/Output from previous day: 10/02 0701 - 10/03 0700 In: 600 [P.O.:600] Out: -  Intake/Output this shift: Total I/O In: 120 [P.O.:120] Out: -   General appearance: alert, cooperative and no distress GI: normal findings: soft, non-tender and non distended Incision/Wound: Clean and dry  Lab Results:   Recent Labs  11/29/14 0410  WBC 6.9  HGB 9.3*  HCT 27.9*  PLT 211   BMET  Recent Labs  11/27/14 0510 11/29/14 0410  NA 136 136  K 4.4 4.1  CL 105 105  CO2 27 27  GLUCOSE 114* 124*  BUN 21* 19  CREATININE 0.72 0.73  CALCIUM 8.3* 8.2*     Studies/Results: No results found.  Anti-infectives: Anti-infectives    Start     Dose/Rate Route Frequency Ordered Stop   11/23/14 1100  cefTRIAXone (ROCEPHIN) 2 g in dextrose 5 % 50 mL IVPB     2 g 100 mL/hr over 30 Minutes Intravenous On call to O.R. 11/23/14 1059 11/23/14 1258      Assessment/Plan: s/p Procedure(s): DIAGNOSTIC LAPAROSCOPY LAPAROSCOPIC LYSIS OF ADHESIONS Doing well.  OK to stop TNA.  Anticipate discharge tomorrow   LOS: 14 days    Leslie Frey T 11/29/2014

## 2014-11-29 NOTE — Progress Notes (Addendum)
PARENTERAL NUTRITION CONSULT NOTE  Pharmacy Consult for TPN Indication: small bowel obstruction  No Known Allergies  Patient Measurements: Height: 5' (152.4 cm) Weight: 138 lb (62.596 kg) IBW/kg (Calculated) : 45.5  Adjusted body weight: 51kg  Vital Signs: Temp: 98.3 F (36.8 C) (10/03 0702) Temp Source: Oral (10/03 0702) BP: 108/43 mmHg (10/03 0702) Pulse Rate: 80 (10/03 0702) Intake/Output from previous day: 10/02 0701 - 10/03 0700 In: 600 [P.O.:600] Out: -  Intake/Output from this shift: Total I/O In: 120 [P.O.:120] Out: -   Labs:  Recent Labs  11/29/14 0410  WBC 6.9  HGB 9.3*  HCT 27.9*  PLT 211     Recent Labs  11/27/14 0510 11/29/14 0410  NA 136 136  K 4.4 4.1  CL 105 105  CO2 27 27  GLUCOSE 114* 124*  BUN 21* 19  CREATININE 0.72 0.73  CALCIUM 8.3* 8.2*  MG 2.1 2.0  PHOS 3.8 3.3  PROT  --  5.1*  ALBUMIN  --  2.4*  AST  --  48*  ALT  --  42  ALKPHOS  --  60  BILITOT  --  0.3  PREALBUMIN  --  11.1*  TRIG  --  76   Estimated Creatinine Clearance: 46.3 mL/min (by C-G formula based on Cr of 0.73).    Recent Labs  11/28/14 1735 11/29/14 0017 11/29/14 0634  GLUCAP 135* 131* 119*    Medical History: Past Medical History  Diagnosis Date  . Hiatal hernia   . Hypertension   . Heart disease   . Hypercholesterolemia   . Arthritis   . IBS (irritable bowel syndrome)   . GERD (gastroesophageal reflux disease)     Medications:  Scheduled:  . antiseptic oral rinse  7 mL Mouth Rinse q12n4p  . brimonidine  1 drop Both Eyes BID   And  . timolol  1 drop Both Eyes BID  . chlorhexidine  15 mL Mouth Rinse BID  . enoxaparin (LOVENOX) injection  40 mg Subcutaneous Q24H  . feeding supplement (ENSURE ENLIVE)  237 mL Oral BID BM  . gabapentin  300 mg Oral BID  . insulin aspart  0-9 Units Subcutaneous 4 times per day  . latanoprost  1 drop Both Eyes QHS  . lubiprostone  24 mcg Oral BID WC  . pantoprazole (PROTONIX) IV  40 mg Intravenous Q12H   . simvastatin  40 mg Oral QPM  . traZODone  50 mg Oral QHS   Infusions:  . Marland KitchenTPN (CLINIMIX-E) Adult 30 mL/hr at 11/28/14 1740   And  . fat emulsion 240 mL (11/28/14 1739)   Insulin Requirements: 2 units Novolog over last 24 hours  Current Nutrition: Regular diet, no supplements ordered  IVF: none  Central access: PICC TPN start date: 9/27  ASSESSMENT                                                                                                          81yo F admitted w/ N/V and abd pain. CT revealed SBO. She failed conservative management. Now s/p  LOA with laparotomy. Pharmacy is asked to start TPN.  Significant events:  9/28: To clamp NG tube today, will remain NPO.  9/29: Tolerated some clears but not advancing diet today, Possible ileus 9/30: Advanced to FLD 10/2: TPN rate decreased to 22ml/hr 10/3: OK to stop TPN per surgery  Today:   Glucose -at goal < 150  Electrolytes - WNL  Renal - SCr wnl; CrCl low d/t age  LFTs - wnl, albumin low  TGs - wnl  Prealbumin - 9.1 (9/28)  NUTRITIONAL GOALS                                                                                             RD recs: 1550-1750 Kcal/d and 70-80 g/d protein, 2-2.2 L/d fluid. Clinimix E 5/15 at a goal rate of 65 ml/hr + 20% fat emulsion at 10 ml/hr to provide: 78 g/day protein, 1588 Kcal/day.  PLAN                                                                                                                         Patient tolerating regular diet. OK to d/c TPN per surgery. Plan to d/c TPN when current bag complete at 6pm today D/C TPN labs & monitoring. Pharmacy to sign off.  Please re-consult as needed.

## 2014-11-30 LAB — GLUCOSE, CAPILLARY
GLUCOSE-CAPILLARY: 91 mg/dL (ref 65–99)
Glucose-Capillary: 103 mg/dL — ABNORMAL HIGH (ref 65–99)

## 2014-11-30 MED ORDER — PANTOPRAZOLE SODIUM 40 MG PO TBEC
40.0000 mg | DELAYED_RELEASE_TABLET | Freq: Two times a day (BID) | ORAL | Status: DC
  Administered 2014-11-30: 40 mg via ORAL
  Filled 2014-11-30: qty 1

## 2014-11-30 NOTE — Care Management Important Message (Signed)
Important Message  Patient Details  Name: Leslie Frey MRN: [id number] Date of Birth: 19-Mar-1932   Medicare Important Message Given:  Yes-second notification given    Camillo Flaming 11/30/2014, 1:29 [place] Message  Patient Details  Name: Leslie Frey MRN: [id number] Date of Birth: 09/09/1932   Medicare Important Message Given:  Yes-second notification given    Camillo Flaming 11/30/2014, 1:29 PM

## 2014-11-30 NOTE — Progress Notes (Signed)
7 Days Post-Op  Subjective: She is tolerating the food, she just doesn't like it.  Her daughter brought her in some stuff from home but she didn't eat much.  She did have a BM yesterday, + GS, not all that tender.    Objective: Vital signs in last 24 hours: Temp:  [98 F (36.7 C)-98.8 F (37.1 C)] 98.5 F (36.9 C) (10/04 0543) Pulse Rate:  [82-84] 82 (10/04 0543) Resp:  [18-19] 18 (10/04 0543) BP: (100-105)/(50-56) 100/52 mmHg (10/04 0543) SpO2:  [99 %-100 %] 99 % (10/04 0543) Last BM Date: 11/29/14 120 Po recorded nothing else in I/O Regular diet Afebrile, VSS No labs Intake/Output from previous day: 10/03 0701 - 10/04 0700 In: 120 [P.O.:120] Out: -  Intake/Output this shift: Total I/O In: 10 [I.V.:10] Out: -   General appearance: alert, cooperative and no distress Resp: clear to auscultation bilaterally GI: soft, sore, sites look fine, + BS, + BM  Lab Results:   Recent Labs  11/29/14 0410  WBC 6.9  HGB 9.3*  HCT 27.9*  PLT 211    BMET  Recent Labs  11/29/14 0410  NA 136  K 4.1  CL 105  CO2 27  GLUCOSE 124*  BUN 19  CREATININE 0.73  CALCIUM 8.2*   PT/INR No results for input(s): LABPROT, INR in the last 72 hours.   Recent Labs Lab 11/24/14 0540 11/25/14 0556 11/29/14 0410  AST 20 15 48*  ALT 12* 10* 42  ALKPHOS 46 47 60  BILITOT 0.5 0.3 0.3  PROT 5.2* 4.8* 5.1*  ALBUMIN 2.6* 2.4* 2.4*     Lipase     Component Value Date/Time   LIPASE 27 11/15/2014 1504     Studies/Results: No results found.  Medications: . antiseptic oral rinse  7 mL Mouth Rinse q12n4p  . brimonidine  1 drop Both Eyes BID   And  . timolol  1 drop Both Eyes BID  . chlorhexidine  15 mL Mouth Rinse BID  . enoxaparin (LOVENOX) injection  40 mg Subcutaneous Q24H  . feeding supplement (ENSURE ENLIVE)  237 mL Oral BID BM  . gabapentin  300 mg Oral BID  . latanoprost  1 drop Both Eyes QHS  . lubiprostone  24 mcg Oral BID WC  . pantoprazole  40 mg Oral BID AC  .  simvastatin  40 mg Oral QPM  . traZODone  50 mg Oral QHS    Assessment/Plan SBO with diagnostic laparoscopy and lysis of adhesions, 7/82/42, Dr. Leighton Ruff Bigeminy/Trigeniny Hypertension CAD Normocytic anemia/thrombcytopenia Dyslipidemia Antibiotics:  None DVT:  Lovenox/SCD  Plan:  I think from a surgical standpoint she can go.  I am worried about her PO intake, but I doubt it will get better here.  I will put follow up info in the AVS with Dr. Marcello Moores.    LOS: 15 days    Leslie Frey 11/30/2014

## 2014-11-30 NOTE — Progress Notes (Signed)
The patient is receiving Protonix by the intravenous route.  Based on criteria approved by the Pharmacy and [place], the medication is being converted to the equivalent oral dose form.  These criteria include: -No Active GI bleeding -Able to tolerate diet of full liquids (or better) or tube feeding OR able to tolerate other medications by the oral or enteral route  If you have any questions about this conversion, please contact the Pharmacy Department (ext [phone]).  Thank you.  Biagio Borg, [facility] 11/30/2014 7:56 AM

## 2014-11-30 NOTE — Discharge Summary (Signed)
Physician Discharge Summary  Leslie Frey TMA:[id number] DOB: [birth year] DOA: 11/15/2014  PCP: No primary care provider on file.  Admit date: 11/15/2014 Discharge date: 11/30/2014  Time spent: 35 minutes  Recommendations for Outpatient Follow-up:  1. Follow-up with surgery as an outpatient. 2. Continue antihypertensive medication.  Discharge Diagnoses:  Principal Problem:   SBO (small bowel obstruction) (HCC) Active Problems:   CAD (coronary artery disease), native coronary artery   Nausea and vomiting   Dyslipidemia   Hypokalemia   Leukocytosis   Thrombocytopenia (HCC)   Hypomagnesemia   Normocytic anemia   Essential hypertension   Bigeminy   Trigeminy   Moderate protein-calorie malnutrition ([place])   Discharge Condition: stable  Diet recommendation: regular  Filed Weights   11/16/14 1348  Weight: 62.596 kg (138 lb)    History of present illness:  81 year old with past medical history of hypertension that presents to the ED complaining of epigastric pain and vomiting that started 24 hours prior to admission.  Hospital Course:  Small bowel obstruction due to adhesion: Patient placed on nothing by mouth NG tube was placed to intermittent suction CT scan of the abdomen was done with results of below, surgery was consulted and recommended conservative management, when conservative management failed, diagnostic laparoscopy with lysis of adhesion was performed on 11/23/2014. She was also started on TNA and a PICC line was placed on 11/22/2014 Gastrografin was done on 922 and showed that the contrast had reached the colon. The patient was slow to start having bowel movements and passing gas, so surgery recommended to continue the NG tube in place. She was started on a clear liquid diet which she tolerated. She will follow-up with surgery as an outpatient.  Bigeminy/trigeminy: The patient wasn on beta blocker she has remained stable is resolved.  Essential hypertension: No  changes were made to her medication.  Hypomagnesemia hypokalemia: Secondary to gastrointestinal losses this was repleted and monitored.  Leukocytosis: Likely due to adhesion and small bowel obstruction does resolve.  Dyslipidemia: No changes were made to her medication.    Procedures:  Diagnostic laparoscopy:  CT scan of the abdomen and pelvis.  Abdominal x-ray  Consultations:  Surgery  Discharge Exam: Filed Vitals:   11/30/14 0543  BP: 100/52  Pulse: 82  Temp: 98.5 F (36.9 C)  Resp: 18    General: A&O x3 Cardiovascular: RRR Respiratory: good air movement CTA B/L  Discharge Instructions   Discharge Instructions    Diet - low sodium heart healthy    Complete by:  As directed      Increase activity slowly    Complete by:  As directed           Current Discharge Medication List    CONTINUE these medications which have NOT CHANGED   Details  AMITIZA 24 MCG capsule Take 1 capsule by mouth 2 (two) times daily.    aspirin 81 MG chewable tablet Chew 81 mg by mouth daily.    Biotin 1000 MCG tablet Take 1,000 mcg by mouth daily.    bumetanide (BUMEX) 1 MG tablet Take 1 tablet by mouth daily.    carvedilol (COREG) 3.125 MG tablet Take 3.125 mg by mouth 2 (two) times daily.    COMBIGAN 0.2-0.5 % ophthalmic solution Place 1 drop into both eyes 2 (two) times daily.    gabapentin (NEURONTIN) 100 MG capsule Take 300 mg by mouth 2 (two) times daily.    hydrOXYzine (ATARAX/VISTARIL) 25 MG tablet Take 25 mg by mouth 2 (two) times  daily as needed for itching.    lisinopril (PRINIVIL,ZESTRIL) 5 MG tablet Take 5 mg by mouth daily.    nitroGLYCERIN (NITROSTAT) 0.4 MG SL tablet Place 0.4 mg under the tongue every 5 (five) minutes as needed for chest pain.    omeprazole (PRILOSEC) 40 MG capsule Take 40 mg by mouth 2 (two) times daily.    OVER THE COUNTER MEDICATION "Detoxin with chlorella."    potassium chloride 20 MEQ/15ML (10%) SOLN Take 45 mLs by mouth daily.     simvastatin (ZOCOR) 40 MG tablet Take 40 mg by mouth every evening.    TRAVATAN Z 0.004 % SOLN ophthalmic solution Place 1 drop into both eyes at bedtime.    !! traZODone (DESYREL) 50 MG tablet Take 60 mg by mouth daily.    !! traZODone (DESYREL) 50 MG tablet Take 50 mg by mouth at bedtime.     !! - Potential duplicate medications found. Please discuss with provider.     No Known Allergies Follow-up Information    Follow up with Rosario Adie., MD. Schedule an appointment as soon as possible for a visit in 3 weeks.   Specialty:  General Surgery   Contact information:   [place] [id number] [phone]        The results of significant diagnostics from this hospitalization (including imaging, microbiology, ancillary and laboratory) are listed below for reference.    Significant Diagnostic Studies: Dg Chest 2 View  11/15/2014   CLINICAL DATA:  Mid chest pain, weakness, epigastric pain and emesis today.  EXAM: CHEST  2 VIEW  COMPARISON:  None.  FINDINGS: Lungs are hypoinflated without consolidation or effusion. Cardiomediastinal silhouette is within normal. There is calcified plaque over the thoracic aorta. There are mild degenerate changes of the spine.  IMPRESSION: Hypoinflation without acute cardiopulmonary disease.   Electronically Signed   By: Marin Olp M.D.   On: 11/15/2014 15:54   Dg Abd 1 View  11/17/2014   CLINICAL DATA:  Small bowel obstruction. Multiple abdominal surgeries. Constipation.  EXAM: ABDOMEN - 1 VIEW  COMPARISON:  11/16/2014  FINDINGS: Dilated loops of small bowel for cyst, measuring up to 4.7 cm in diameter. Nasogastric tube noted, tip in the stomach body. The number of gas-filled small loops appears slightly less than on yesterday' s exam, with caliber of the visualized loops similar. No secondary signs free intraperitoneal gas.  IMPRESSION: 1. There is a reduced number of visible dilated air-filled loops of small bowel, but  dilated loops are still present and of similar caliber to the prior exam. Abnormal appearance could reflect obstruction or ileus.   Electronically Signed   By: Van Clines M.D.   On: 11/17/2014 09:10   Dg Abd 1 View  11/16/2014   CLINICAL DATA:  Small bowel obstruction.  EXAM: ABDOMEN - 1 VIEW  COMPARISON:  11/15/2014  FINDINGS: A new nasogastric tube is seen with tip in the proximal stomach. Multiple dilated small bowel loops again seen, with mild decrease in degree of dilatation. No colonic gas visualized. Contrast seen within the urinary bladder from recent CT.  IMPRESSION: Nasogastric tube in appropriate position. Small bowel obstruction, with mild decrease in degree of dilatation since prior exam.   Electronically Signed   By: Earle Gell M.D.   On: 11/16/2014 12:21   Dg Abd 1 View  11/15/2014   CLINICAL DATA:  Vomiting, nausea, epigastric abdominal pain and severe constipation. Reported history of hiatal hernia, appendectomy, hysterectomy and  cholecystectomy.  EXAM: ABDOMEN - 1 VIEW  COMPARISON:  None.  FINDINGS: There are prominently dilated small bowel loops throughout the abdomen measuring up to 5.5 cm name in diameter. There is an absence of colonic gas. No evidence of pneumatosis or pneumoperitoneum. Surgical clips are noted in the right upper quadrant. Curvilinear left upper quadrant calcifications are likely atherosclerotic splenic artery calcifications. Ovoid rim calcified structure overlying the right gluteal soft tissues, likely a gluteal granuloma. Visualized lung bases are clear. Moderate degenerative changes are seen in the visualized thoracolumbar spine.  IMPRESSION: Prominently dilated small bowel loops throughout the abdomen. Paucity of colonic gas. Findings are most suggestive of a distal small bowel obstruction. Consider further evaluation with CT of the abdomen and pelvis with oral and intravenous contrast.  These results were called by telephone at the time of interpretation on  11/15/2014 at 4:47 pm to Dr. Theotis Burrow , who verbally acknowledged these results.   Electronically Signed   By: Ilona Sorrel M.D.   On: 11/15/2014 16:47   Ct Abdomen Pelvis W Contrast  11/15/2014   CLINICAL DATA:  Nausea and vomiting with last bowel movement 11/12/2014. Previous cholecystectomy and appendectomy as well as hysterectomy.  EXAM: CT ABDOMEN AND PELVIS WITH CONTRAST  TECHNIQUE: Multidetector CT imaging of the abdomen and pelvis was performed using the standard protocol following bolus administration of intravenous contrast.  CONTRAST:  182mL OMNIPAQUE IOHEXOL 300 MG/ML  SOLN  COMPARISON:  None.  FINDINGS: Lung bases are within normal. Minimal calcification of the mitral valve annulus. Moderate size hiatal hernia is present.  Abdominal images demonstrate surgical absence of the gallbladder. There is a subcentimeter hypodensity over the left lobe of the liver too small to characterize but likely a cyst. The spleen, pancreas and adrenal glands are normal.  Kidneys are normal in size without hydronephrosis or nephrolithiasis. There is a subcentimeter hypodensity over the lower pole and upper pole cortex of the right kidney likely cysts but too small to characterize.  There are multiple dilated fluid in air-filled small bowel loops measuring up to 4.7 cm in diameter compatible with a small bowel obstruction. Site of obstruction is due to two areas of small bowel stricturing over the mid abdomen just anterior to the aortic bifurcation likely due to adhesions. There is obstruction of small bowel proximal to the more proximal stricturing as well as dilatation of a short segment of small bowel between the 2 areas of stricturing likely a closed-loop component. No evidence of pneumatosis or free peritoneal air. No evidence of free fluid. Small bowel distal to these areas of stricturing is decompressed. Colon is decompressed.  Pelvic images demonstrate the bladder and rectum to be within normal. Surgical  absence of the uterus. Tiny amount of free fluid in the pelvis.  There mild degenerate changes of the spine and hips.  IMPRESSION: Mid to distal small bowel obstruction likely due to adhesions over the mid abdomen just anterior to the aortic bifurcation. There is dilatation of the small bowel proximal to the more proximal area of stricturing. There is a second area of small bowel stricturing with a dilated loop caught between these 2 areas of stricturing likely a closed loop component. No pneumatosis or free air.  Subcentimeter liver hypodensity too small to characterize but likely a cyst. Couple subcentimeter right renal cortical hypodensities too small to characterize but likely cysts.  Postsurgical changes as described.  Moderate size hiatal hernia.  These results were called by telephone at the time of interpretation  on 11/15/2014 at 7:05 pm to Dr. Theotis Burrow , who verbally acknowledged these results.   Electronically Signed   By: Marin Olp M.D.   On: 11/15/2014 19:05   Dg Chest Port 1 View  11/24/2014   ADDENDUM REPORT: 11/24/2014 11:29 ADDENDUM: OG tube projects over the stomach and dilated small bowel loops are partially visualized on this examination consistent with history of small bowel obstruction. Electronically Signed   By: Skipper Cliche M.D.   On: 11/24/2014 11:29  11/24/2014   CLINICAL DATA:  Small bowel obstruction, cough today  EXAM: PORTABLE CHEST 1 VIEW  COMPARISON:  11/15/2014  FINDINGS: Mild cardiac enlargement stable. Right PICC line identified with tip at the cavoatrial junction. Moderate to large hiatal hernia. Vascular pattern normal. Mild bibasilar atelectasis.  IMPRESSION: No acute findings.  Mild bilateral lower lobe atelectasis.  Electronically Signed: By: Skipper Cliche M.D. On: 11/24/2014 11:25   Dg Abd 2 Views  11/23/2014   CLINICAL DATA:  Mid abdominal pain.  Small-bowel obstruction.  EXAM: ABDOMEN - 2 VIEW  COMPARISON:  Plain films of the abdomen 11/18/2014,  11/20/2014 and 11/21/2014.  FINDINGS: No free intraperitoneal air is identified. Contrast material in the colon seen on the prior examination has progressed somewhat. There is dilatation of small bowel with loops measuring up to 4.0 cm. Gaseous distention of small bowel has improved since yesterday's examination. NG tube is in place.  IMPRESSION: Continued improvement in small bowel obstruction. No new abnormality.   Electronically Signed   By: Inge Rise M.D.   On: 11/23/2014 10:25   Dg Abd 2 Views  11/21/2014   CLINICAL DATA:  Abdominal distention this morning. Follow-up small bowel obstruction.  EXAM: ABDOMEN - 2 VIEW  COMPARISON:  11/20/2014  FINDINGS: Degree of small bowel distention decreased since prior study. Mildly prominent sigmoid colon loops in the lower abdomen and upper pelvis. No free air. No organomegaly. Oral contrast material noted within the right colon and transverse colon.  IMPRESSION: Improving small bowel distention. Mild gaseous distention of the colon currently.   Electronically Signed   By: Rolm Baptise M.D.   On: 11/21/2014 08:46   Dg Abd 2 Views  11/20/2014   CLINICAL DATA:  Small bowel obstruction.  Upper abdominal pain.  EXAM: ABDOMEN - 2 VIEW  COMPARISON:  11/18/2014  FINDINGS: No intraperitoneal free air is identified. Enteric tube terminates over the expected region of the gastric body. Residual oral contrast material is again seen in the colon. There is persistent prominent dilatation of multiple small bowel loops measuring up to 6-6.5 cm in diameter, stable to slightly increased from the prior study. Upper abdominal surgical clips are noted.  IMPRESSION: Stable to slightly increased small bowel dilatation consistent with ongoing obstruction.   Electronically Signed   By: Logan Bores M.D.   On: 11/20/2014 12:13   Dg Abd Portable 1v  11/18/2014   CLINICAL DATA:  Small bowel obstruction protocol. Patient was given oral contrast at 10:30 a.m.  EXAM: PORTABLE ABDOMEN - 1  VIEW  COMPARISON:  11/18/2014 at 9:25 a.m.  FINDINGS: Contrast as extended into the colon. There is small bowel dilation similar to the earlier study.  Nasogastric tube tip projects in the mid stomach.  IMPRESSION: 1. Contrast as extended into the colon.  Colon is nondistended. 2. There is persistent small bowel dilation similar to the earlier study. Findings are consistent with a partial small bowel obstruction.   Electronically Signed   By: Dedra Skeens.D.  On: 11/18/2014 18:59   Dg Abd Portable 1v-small Bowel Obstruction Protocol-initial, 8 Hr Delay  11/18/2014   CLINICAL DATA:  Abdominal pain and distension, small bowel obstruction on recent CT and plain film examination.  EXAM: PORTABLE ABDOMEN - 1 VIEW  COMPARISON:  11/17/2014  FINDINGS: Nasogastric catheter remains in the stomach. Scattered large and small bowel gas is noted. Small bowel is dilated to 5.2 cm. The overall appearance is similar to that seen on the prior exam. This likely represents a partial small bowel obstruction given the degree of colonic gas.  IMPRESSION: Persistent small bowel obstruction with dilatation of the small bowel to 5.2 cm.   Electronically Signed   By: Inez Catalina M.D.   On: 11/18/2014 10:09    Microbiology: No results found for this or any previous visit (from the past 240 hour(s)).   Labs: Basic Metabolic Panel:  Recent Labs Lab 11/24/14 0540 11/25/14 0556 11/27/14 0510 11/29/14 0410  NA 134* 131* 136 136  K 4.6 4.5 4.4 4.1  CL 101 103 105 105  CO2 26 25 27 27   GLUCOSE 154* 139* 114* 124*  BUN 11 19 21* 19  CREATININE 0.82 0.73 0.72 0.73  CALCIUM 8.5* 7.9* 8.3* 8.2*  MG 1.6* 2.1 2.1 2.0  PHOS 3.3 2.7 3.8 3.3   Liver Function Tests:  Recent Labs Lab 11/24/14 0540 11/25/14 0556 11/29/14 0410  AST 20 15 48*  ALT 12* 10* 42  ALKPHOS 46 47 60  BILITOT 0.5 0.3 0.3  PROT 5.2* 4.8* 5.1*  ALBUMIN 2.6* 2.4* 2.4*   No results for input(s): LIPASE, AMYLASE in the last 168 hours. No results  for input(s): AMMONIA in the last 168 hours. CBC:  Recent Labs Lab 11/24/14 0540 11/29/14 0410  WBC 9.4 6.9  NEUTROABS 7.0 3.9  HGB 12.2 9.3*  HCT 36.3 27.9*  MCV 92.6 91.8  PLT 172 211   Cardiac Enzymes: No results for input(s): CKTOTAL, CKMB, CKMBINDEX, TROPONINI in the last 168 hours. BNP: BNP (last 3 results) No results for input(s): BNP in the last 8760 hours.  ProBNP (last 3 results) No results for input(s): PROBNP in the last 8760 hours.  CBG:  Recent Labs Lab 11/29/14 0634 11/29/14 1213 11/29/14 1823 11/30/14 0542 11/30/14 0814  GLUCAP 119* 121* 109* 91 103*     Signed:  Charlynne Cousins  [facility] 11/30/2014, 10:38 AM

## 2014-12-13 DIAGNOSIS — H35363 Drusen (degenerative) of macula, bilateral: Secondary | ICD-10-CM | POA: Diagnosis not present

## 2014-12-13 DIAGNOSIS — Z961 Presence of intraocular lens: Secondary | ICD-10-CM | POA: Diagnosis not present

## 2014-12-13 DIAGNOSIS — H401134 Primary open-angle glaucoma, bilateral, indeterminate stage: Secondary | ICD-10-CM | POA: Diagnosis not present

## 2014-12-13 DIAGNOSIS — H524 Presbyopia: Secondary | ICD-10-CM | POA: Diagnosis not present

## 2015-01-05 ENCOUNTER — Encounter: Payer: Self-pay | Admitting: *Deleted

## 2015-01-06 NOTE — Progress Notes (Signed)
Patient ID: Sherran Medlar, female   DOB: Jan 29, 1933, 81 y.o.   MRN: [id number]     Patient Name: Leslie Frey Date of Encounter: 01/07/2015  Active Problems:   * No active hospital problems. *   Primary Cardiologist: Dr Marko Stai in [facility], now lives in [place] w/ dtr>>Dr Johnsie Cancel    81 yo female w/ non-obs dz by cath 2007, non-isch nuclear stress 2015, nl EF by echo 2015, HTN, HL, OA. Admitted 09/19 w/ SBO. Saw her Pre op and cleared for surgery  SBO cleared and did not require surgery .    OBJECTIVE Filed Vitals:   01/07/15 1111  BP: 92/70  Pulse: 86  Height: 5' (1.524 m)  Weight: 63.504 kg (140 lb)    Filed Weights   01/07/15 1111  Weight: 63.504 kg (140 lb)    PHYSICAL EXAM General: Well developed, well nourished, female in no acute distress. Head: Normocephalic, atraumatic.  Neck: Supple without bruits, JVD not elevated. Lungs:  Resp regular and unlabored, decreased BS bases. Heart: Slightly irreg, S1, S2, no S3, S4, or murmur; no rub. Abdomen: oost SB rection incision healed well Extremities: No clubbing, cyanosis, edema.  Neuro: Alert and oriented X 3. Moves all extremities spontaneously.  LABS: CBC:No results for input(s): WBC, NEUTROABS, HGB, HCT, MCV, PLT in the last 72 hours. INR:No results for input(s): INR in the last 72 hours. Basic Metabolic Panel:No results for input(s): NA, K, CL, CO2, GLUCOSE, BUN, CREATININE, CALCIUM, MG, PHOS in the last 72 hours. TELE:  Not on  ECHO: 11/18/2014 - Left ventricle: The cavity size was normal. Wall thickness was increased in a pattern of mild LVH. Systolic function was normal. The estimated ejection fraction was in the range of 55% to 60%. Hypokinesis of the inferolateral myocardium. - Mitral valve: Moderately calcified annulus. There was moderate regurgitation. - Left atrium: The atrium was mildly to moderately dilated. - Right ventricle: The cavity size was mildly dilated. Wall thickness was normal. - Right  atrium: The atrium was mildly to moderately dilated. - Tricuspid valve: There was moderate regurgitation. - Pulmonary arteries: Systolic pressure was moderately increased. PA peak pressure: 49 mm Hg (S).  Radiology/Studies: Dg Abd Portable 1v 11/18/2014   CLINICAL DATA:  Small bowel obstruction protocol. Patient was given oral contrast at 10:30 a.m.  EXAM: PORTABLE ABDOMEN - 1 VIEW  COMPARISON:  11/18/2014 at 9:25 a.m.  FINDINGS: Contrast as extended into the colon. There is small bowel dilation similar to the earlier study.  Nasogastric tube tip projects in the mid stomach.  IMPRESSION: 1. Contrast as extended into the colon.  Colon is nondistended. 2. There is persistent small bowel dilation similar to the earlier study. Findings are consistent with a partial small bowel obstruction.   Electronically Signed   By: Lajean Manes M.D.   On: 11/18/2014 18:59   Dg Abd Portable 1v-small Bowel Obstruction Protocol-initial, 8 Hr Delay 11/18/2014   CLINICAL DATA:  Abdominal pain and distension, small bowel obstruction on recent CT and plain film examination.  EXAM: PORTABLE ABDOMEN - 1 VIEW  COMPARISON:  11/17/2014  FINDINGS: Nasogastric catheter remains in the stomach. Scattered large and small bowel gas is noted. Small bowel is dilated to 5.2 cm. The overall appearance is similar to that seen on the prior exam. This likely represents a partial small bowel obstruction given the degree of colonic gas.  IMPRESSION: Persistent small bowel obstruction with dilatation of the small bowel to 5.2 cm.   Electronically Signed   By: Elta [place]  Lukens M.D.   On: 11/18/2014 10:09   Current Medications:  . antiseptic oral rinse  7 mL Mouth Rinse q12n4p  . brimonidine  1 drop Both Eyes BID   And  . timolol  1 drop Both Eyes BID  . chlorhexidine  15 mL Mouth Rinse BID  . gabapentin  300 mg Oral BID  . latanoprost  1 drop Both Eyes QHS  . lubiprostone  24 mcg Oral BID WC  . metoprolol  2.5 mg Intravenous 4 times per day    . pantoprazole (PROTONIX) IV  40 mg Intravenous Daily  . traZODone  50 mg Oral QHS    ECG:  SR PVCls nonspecific ST changes normal QT  11/24/14   ASSESSMENT AND PLAN:   CAD (coronary artery disease), native coronary artery - non-obstructive by remote cath - non-ischemic nuclear stress 2015 - EF normal w/ no WMA by echo 09/22, results above Continue medical RX     SBO (small bowel obstruction) Post resection with no need for ostomy.  Normal post op check.  Doing well     Rozann Lesches ,  12:07 PM 01/07/2015

## 2015-01-07 ENCOUNTER — Ambulatory Visit (INDEPENDENT_AMBULATORY_CARE_PROVIDER_SITE_OTHER): Payer: Medicare Other | Admitting: Cardiovascular Disease

## 2015-01-07 ENCOUNTER — Encounter: Payer: Self-pay | Admitting: Cardiovascular Disease

## 2015-01-07 VITALS — BP 92/70 | HR 86 | Ht 60.0 in | Wt 140.0 lb

## 2015-01-07 DIAGNOSIS — R0602 Shortness of breath: Secondary | ICD-10-CM | POA: Diagnosis not present

## 2015-01-07 DIAGNOSIS — Z23 Encounter for immunization: Secondary | ICD-10-CM | POA: Diagnosis not present

## 2015-01-07 NOTE — Patient Instructions (Addendum)
Medication Instructions:  Your physician recommends that you continue on your current medications as directed. Please refer to the Current Medication list given to you today.  Labwork: NONE  Testing/Procedures: NONE  Follow-Up: Your physician wants you to follow-up in: 3 months with Dr. Nishan.   If you need a refill on your cardiac medications before your next appointment, please call your pharmacy.    

## 2015-01-26 DIAGNOSIS — Z6825 Body mass index (BMI) 25.0-25.9, adult: Secondary | ICD-10-CM | POA: Diagnosis not present

## 2015-01-26 DIAGNOSIS — Z8669 Personal history of other diseases of the nervous system and sense organs: Secondary | ICD-10-CM | POA: Diagnosis not present

## 2015-01-26 DIAGNOSIS — G4700 Insomnia, unspecified: Secondary | ICD-10-CM | POA: Diagnosis not present

## 2015-01-26 DIAGNOSIS — E784 Other hyperlipidemia: Secondary | ICD-10-CM | POA: Diagnosis not present

## 2015-01-26 DIAGNOSIS — H409 Unspecified glaucoma: Secondary | ICD-10-CM | POA: Diagnosis not present

## 2015-01-26 DIAGNOSIS — I10 Essential (primary) hypertension: Secondary | ICD-10-CM | POA: Diagnosis not present

## 2015-01-26 DIAGNOSIS — M545 Low back pain: Secondary | ICD-10-CM | POA: Diagnosis not present

## 2015-01-26 DIAGNOSIS — I2510 Atherosclerotic heart disease of native coronary artery without angina pectoris: Secondary | ICD-10-CM | POA: Diagnosis not present

## 2015-01-26 DIAGNOSIS — K5900 Constipation, unspecified: Secondary | ICD-10-CM | POA: Diagnosis not present

## 2015-01-26 DIAGNOSIS — Z1389 Encounter for screening for other disorder: Secondary | ICD-10-CM | POA: Diagnosis not present

## 2015-01-26 DIAGNOSIS — K219 Gastro-esophageal reflux disease without esophagitis: Secondary | ICD-10-CM | POA: Diagnosis not present

## 2015-02-25 DIAGNOSIS — I2510 Atherosclerotic heart disease of native coronary artery without angina pectoris: Secondary | ICD-10-CM | POA: Diagnosis not present

## 2015-02-25 DIAGNOSIS — E784 Other hyperlipidemia: Secondary | ICD-10-CM | POA: Diagnosis not present

## 2015-02-25 DIAGNOSIS — N390 Urinary tract infection, site not specified: Secondary | ICD-10-CM | POA: Diagnosis not present

## 2015-02-25 DIAGNOSIS — I10 Essential (primary) hypertension: Secondary | ICD-10-CM | POA: Diagnosis not present

## 2015-02-25 DIAGNOSIS — R8290 Unspecified abnormal findings in urine: Secondary | ICD-10-CM | POA: Diagnosis not present

## 2015-03-03 DIAGNOSIS — M545 Low back pain: Secondary | ICD-10-CM | POA: Diagnosis not present

## 2015-03-03 DIAGNOSIS — R609 Edema, unspecified: Secondary | ICD-10-CM | POA: Diagnosis not present

## 2015-03-03 DIAGNOSIS — Z0000 Encounter for general adult medical examination without abnormal findings: Secondary | ICD-10-CM | POA: Diagnosis not present

## 2015-03-03 DIAGNOSIS — Z1389 Encounter for screening for other disorder: Secondary | ICD-10-CM | POA: Diagnosis not present

## 2015-03-03 DIAGNOSIS — I25118 Atherosclerotic heart disease of native coronary artery with other forms of angina pectoris: Secondary | ICD-10-CM | POA: Diagnosis not present

## 2015-03-03 DIAGNOSIS — I129 Hypertensive chronic kidney disease with stage 1 through stage 4 chronic kidney disease, or unspecified chronic kidney disease: Secondary | ICD-10-CM | POA: Diagnosis not present

## 2015-03-03 DIAGNOSIS — N183 Chronic kidney disease, stage 3 (moderate): Secondary | ICD-10-CM | POA: Diagnosis not present

## 2015-03-03 DIAGNOSIS — H409 Unspecified glaucoma: Secondary | ICD-10-CM | POA: Diagnosis not present

## 2015-03-03 DIAGNOSIS — B351 Tinea unguium: Secondary | ICD-10-CM | POA: Diagnosis not present

## 2015-03-03 DIAGNOSIS — E784 Other hyperlipidemia: Secondary | ICD-10-CM | POA: Diagnosis not present

## 2015-03-03 DIAGNOSIS — I10 Essential (primary) hypertension: Secondary | ICD-10-CM | POA: Diagnosis not present

## 2015-03-03 DIAGNOSIS — Z6825 Body mass index (BMI) 25.0-25.9, adult: Secondary | ICD-10-CM | POA: Diagnosis not present

## 2015-03-09 ENCOUNTER — Other Ambulatory Visit: Payer: Self-pay | Admitting: Internal Medicine

## 2015-03-09 DIAGNOSIS — Z1231 Encounter for screening mammogram for malignant neoplasm of breast: Secondary | ICD-10-CM

## 2015-03-16 DIAGNOSIS — H401134 Primary open-angle glaucoma, bilateral, indeterminate stage: Secondary | ICD-10-CM | POA: Diagnosis not present

## 2015-03-28 ENCOUNTER — Ambulatory Visit
Admission: RE | Admit: 2015-03-28 | Discharge: 2015-03-28 | Disposition: A | Discharge status: Home / Self Care | Payer: Medicare Other | Source: Ambulatory Visit | Attending: Internal Medicine | Admitting: Internal Medicine

## 2015-03-28 DIAGNOSIS — Z1231 Encounter for screening mammogram for malignant neoplasm of breast: Secondary | ICD-10-CM | POA: Diagnosis not present

## 2015-04-18 ENCOUNTER — Ambulatory Visit: Payer: Medicare Other | Admitting: Cardiovascular Disease

## 2015-04-21 DIAGNOSIS — J321 Chronic frontal sinusitis: Secondary | ICD-10-CM | POA: Diagnosis not present

## 2015-04-21 DIAGNOSIS — Z6826 Body mass index (BMI) 26.0-26.9, adult: Secondary | ICD-10-CM | POA: Diagnosis not present

## 2015-04-21 DIAGNOSIS — I10 Essential (primary) hypertension: Secondary | ICD-10-CM | POA: Diagnosis not present

## 2015-06-08 DIAGNOSIS — H401134 Primary open-angle glaucoma, bilateral, indeterminate stage: Secondary | ICD-10-CM | POA: Diagnosis not present

## 2015-06-08 DIAGNOSIS — H2000 Unspecified acute and subacute iridocyclitis: Secondary | ICD-10-CM | POA: Diagnosis not present

## 2015-06-10 DIAGNOSIS — H2000 Unspecified acute and subacute iridocyclitis: Secondary | ICD-10-CM | POA: Diagnosis not present

## 2015-06-13 DIAGNOSIS — H2000 Unspecified acute and subacute iridocyclitis: Secondary | ICD-10-CM | POA: Diagnosis not present

## 2015-06-13 DIAGNOSIS — H401134 Primary open-angle glaucoma, bilateral, indeterminate stage: Secondary | ICD-10-CM | POA: Diagnosis not present

## 2015-06-20 DIAGNOSIS — H2000 Unspecified acute and subacute iridocyclitis: Secondary | ICD-10-CM | POA: Diagnosis not present

## 2015-06-21 DIAGNOSIS — L602 Onychogryphosis: Secondary | ICD-10-CM | POA: Diagnosis not present

## 2015-07-04 DIAGNOSIS — H2000 Unspecified acute and subacute iridocyclitis: Secondary | ICD-10-CM | POA: Diagnosis not present

## 2015-07-04 DIAGNOSIS — H401134 Primary open-angle glaucoma, bilateral, indeterminate stage: Secondary | ICD-10-CM | POA: Diagnosis not present

## 2015-07-06 DIAGNOSIS — R413 Other amnesia: Secondary | ICD-10-CM | POA: Diagnosis not present

## 2015-07-20 DIAGNOSIS — H401134 Primary open-angle glaucoma, bilateral, indeterminate stage: Secondary | ICD-10-CM | POA: Diagnosis not present

## 2015-08-24 DIAGNOSIS — H401134 Primary open-angle glaucoma, bilateral, indeterminate stage: Secondary | ICD-10-CM | POA: Diagnosis not present

## 2015-09-02 DIAGNOSIS — E876 Hypokalemia: Secondary | ICD-10-CM | POA: Diagnosis not present

## 2015-09-02 DIAGNOSIS — I10 Essential (primary) hypertension: Secondary | ICD-10-CM | POA: Diagnosis not present

## 2015-09-02 DIAGNOSIS — F331 Major depressive disorder, recurrent, moderate: Secondary | ICD-10-CM | POA: Diagnosis not present

## 2015-09-02 DIAGNOSIS — E784 Other hyperlipidemia: Secondary | ICD-10-CM | POA: Diagnosis not present

## 2015-09-02 DIAGNOSIS — F0390 Unspecified dementia without behavioral disturbance: Secondary | ICD-10-CM | POA: Diagnosis not present

## 2015-09-02 DIAGNOSIS — Z6825 Body mass index (BMI) 25.0-25.9, adult: Secondary | ICD-10-CM | POA: Diagnosis not present

## 2015-09-20 DIAGNOSIS — S61206A Unspecified open wound of right little finger without damage to nail, initial encounter: Secondary | ICD-10-CM | POA: Diagnosis not present

## 2015-09-22 ENCOUNTER — Encounter: Payer: Self-pay | Admitting: *Deleted

## 2015-09-23 ENCOUNTER — Encounter: Payer: Self-pay | Admitting: Diagnostic Neuroimaging

## 2015-09-23 ENCOUNTER — Ambulatory Visit (INDEPENDENT_AMBULATORY_CARE_PROVIDER_SITE_OTHER): Payer: Medicare Other | Admitting: Diagnostic Neuroimaging

## 2015-09-23 VITALS — BP 103/63 | HR 64 | Ht 60.0 in | Wt 141.4 lb

## 2015-09-23 DIAGNOSIS — F0390 Unspecified dementia without behavioral disturbance: Secondary | ICD-10-CM

## 2015-09-23 DIAGNOSIS — F03A0 Unspecified dementia, mild, without behavioral disturbance, psychotic disturbance, mood disturbance, and anxiety: Secondary | ICD-10-CM

## 2015-09-23 NOTE — Progress Notes (Signed)
[facility]  PATIENT: Leslie Frey DOB: January 30, 1933  REFERRING CLINICIAN: S Holwerda HISTORY FROM: patient and daughter REASON FOR VISIT: new consult   HISTORICAL  CHIEF COMPLAINT:  Chief Complaint  Patient presents with  . Dementia    rm 7, New Pt, dgtrKendrick Fries, MMSE 24    HISTORY OF PRESENT ILLNESS:   82 year old right-handed female here for evaluation of memory loss. According to patient she has some mild memory problems, but not excessive for age and not that significant. According to patient's daughter, patient has been having at least 4 year history of progressive short-term memory problems, forgetfulness, accusatory and paranoid behaviors. Patient was trying to have trouble paying bills on time, mixing up finances, forgetting to take her medications. In August 2016 patient moved in with her daughter. Patient still able to take care of most of her personal activities of daily living including bathing, hygiene, dressing, feeding. She is able to use coffee maker and microwave. Patient has her own area within daughter's home. Patient still drives short distances to the mall but not to unfamiliar areas. Sometimes patient gets accusatory towards her daughter when she cannot find objects in the home.  No report of stepwise decline. No sudden onset focal weakness, numbness, slurred speech. No history of stroke.  Of note in 05-31-11 patient's husband died due to suspected complications of dementia, behavior change, resulting in suicide. Patient then went into severe depression after this.    REVIEW OF SYSTEMS: Full 14 system review of systems performed and negative with exception of: Hearing loss constipation runny nose skin sensitivity easy bruising easy bleeding memory loss.  ALLERGIES: No Known Allergies  HOME MEDICATIONS: Outpatient Medications Prior to Visit  Medication Sig Dispense Refill  . AMITIZA 24 MCG capsule Take 1 capsule by mouth 2 (two) times daily.     Marland Kitchen aspirin 81 MG chewable tablet Chew 81 mg by mouth daily.    . bumetanide (BUMEX) 1 MG tablet Take 1 tablet by mouth daily.    . carvedilol (COREG) 3.125 MG tablet Take 3.125 mg by mouth 2 (two) times daily.    . COMBIGAN 0.2-0.5 % ophthalmic solution Place 1 drop into both eyes 2 (two) times daily.    Marland Kitchen lisinopril (PRINIVIL,ZESTRIL) 5 MG tablet Take 5 mg by mouth daily.    . nitroGLYCERIN (NITROSTAT) 0.4 MG SL tablet Place 0.4 mg under the tongue every 5 (five) minutes as needed for chest pain (3 doses max).     Marland Kitchen omeprazole (PRILOSEC) 40 MG capsule Take 40 mg by mouth 2 (two) times daily.    Marland Kitchen OVER THE COUNTER MEDICATION Take 1 tablet by mouth daily. "Detoxin with chlorella."    . simvastatin (ZOCOR) 40 MG tablet Take 40 mg by mouth every evening.    . traZODone (DESYREL) 50 MG tablet Take 60 mg by mouth daily.    . Biotin 1000 MCG tablet Take 1,000 mcg by mouth daily.    Marland Kitchen esomeprazole (NEXIUM) 40 MG capsule Take 40 mg by mouth daily at 12 noon.    . gabapentin (NEURONTIN) 100 MG capsule Take 300 mg by mouth 2 (two) times daily.    . hydrOXYzine (ATARAX/VISTARIL) 25 MG tablet Take 25 mg by mouth 2 (two) times daily as needed for itching.    . potassium chloride 20 MEQ/15ML (10%) SOLN Take 45 mLs by mouth daily.    . TRAVATAN Z 0.004 % SOLN ophthalmic solution Place 1 drop into both eyes at bedtime.  No facility-administered medications prior to visit.     PAST MEDICAL HISTORY: Past Medical History:  Diagnosis Date  . Arthritis   . GERD (gastroesophageal reflux disease)   . Heart disease   . Hiatal hernia   . Hypercholesterolemia   . Hypertension   . IBS (irritable bowel syndrome)     PAST SURGICAL HISTORY: Past Surgical History:  Procedure Laterality Date  . ABDOMINAL HYSTERECTOMY  1986  . APPENDECTOMY  1973  . CARDIAC CATHETERIZATION    . CATARACT EXTRACTION  2006  . CHOLECYSTECTOMY  1973  . EYE SURGERY    . LAPAROSCOPIC LYSIS OF ADHESIONS  11/23/2014   Procedure:  LAPAROSCOPIC LYSIS OF ADHESIONS;  Surgeon: Leighton Ruff, MD;  Location: [facility];  Service: General;;  . LAPAROSCOPY N/A 11/23/2014   Procedure: DIAGNOSTIC LAPAROSCOPY;  Surgeon: Leighton Ruff, MD;  Location: [facility];  Service: General;  Laterality: N/A;  . Meniscus Tear  2009  . ULNAR NERVE REPAIR Left 2013    FAMILY HISTORY: Family History  Problem Relation Age of Onset  . Diabetes Mother   . Hypertension Mother   . Heart attack Mother   . Heart attack Father   . Diabetes Sister   . Hypertension Brother   . Heart attack Sister   . Heart attack Brother   . Stroke Sister     SOCIAL HISTORY:  Social History   Social History  . Marital status: Widowed    Spouse name: N/A  . Number of children: 3  . Years of education: 12   Occupational History  . Not on file.   Social History Main Topics  . Smoking status: Former Smoker    Packs/day: 1.00    Years: 2.00    Types: Cigarettes    Quit date: 10/27/1989  . Smokeless tobacco: Never Used  . Alcohol use 0.0 oz/week     Comment: Occasional - 1 glass of wine per week  . Drug use: No  . Sexual activity: Not on file   Other Topics Concern  . Not on file   Social History Narrative   Lives with daughter, Kendrick Fries   Caffeine- coffee 3 cups daily     PHYSICAL EXAM  GENERAL EXAM/CONSTITUTIONAL: Vitals:  Vitals:   09/23/15 1034  BP: 103/63  Pulse: 64  Weight: 141 lb 6.4 oz (64.1 kg)  Height: 5' (1.524 m)     Body mass index is 27.62 kg/m.  Visual Acuity Screening   Right eye Left eye Both eyes  Without correction:     With correction: 20/40 20/70      Patient is in no distress; well developed, nourished and groomed; neck is supple  CARDIOVASCULAR:  Examination of carotid arteries is normal; no carotid bruits  Regular rate and rhythm, no murmurs  Examination of peripheral vascular system by observation and palpation is normal  EYES:  Ophthalmoscopic exam of optic discs and posterior segments is normal; no  papilledema or hemorrhages  MUSCULOSKELETAL:  Gait, strength, tone, movements noted in Neurologic exam below  NEUROLOGIC: MENTAL STATUS:  MMSE - Mini Mental State Exam 09/23/2015  Orientation to time 4  Orientation to Place 3  Registration 3  Attention/ Calculation 4  Recall 1  Language- name 2 objects 2  Language- repeat 1  Language- follow 3 step command 3  Language- read & follow direction 1  Write a sentence 1  Copy design 1  Total score 24    awake, alert, oriented to person, place and time; NOT  DATE  DECREASED RECENT MEMORY  DECR CONCENTRATION   DECR FLUENCY; comprehension intact, naming intact,   fund of knowledge appropriate  FLAT AFFECT  ANIMAL FLUENCY TESTING = 5  CRANIAL NERVE:   2nd - no papilledema on fundoscopic exam  2nd, 3rd, 4th, 6th - pupils equal and reactive to light, visual fields full to confrontation, extraocular muscles intact, no nystagmus  5th - facial sensation symmetric  7th - facial strength symmetric  8th - hearing intact  9th - palate elevates symmetrically, uvula midline  11th - shoulder shrug symmetric  12th - tongue protrusion midline  MOTOR:   normal bulk and tone, full strength in the BUE, BLE  SENSORY:   normal and symmetric to light touch, temperature, vibration  COORDINATION:   finger-nose-finger, fine finger movements normal  REFLEXES:   deep tendon reflexes present and symmetric  POSITIVE SNOUT AND MYERSONS REFLEXES  GAIT/STATION:   narrow based gait    DIAGNOSTIC DATA (LABS, IMAGING, TESTING) - I reviewed patient records, labs, notes, testing and imaging myself where available.  Lab Results  Component Value Date   WBC 6.9 11/29/2014   HGB 9.3 (L) 11/29/2014   HCT 27.9 (L) 11/29/2014   MCV 91.8 11/29/2014   PLT 211 11/29/2014      Component Value Date/Time   NA 136 11/29/2014 0410   K 4.1 11/29/2014 0410   CL 105 11/29/2014 0410   CO2 27 11/29/2014 0410   GLUCOSE 124 (H) 11/29/2014  0410   BUN 19 11/29/2014 0410   CREATININE 0.73 11/29/2014 0410   CALCIUM 8.2 (L) 11/29/2014 0410   PROT 5.1 (L) 11/29/2014 0410   ALBUMIN 2.4 (L) 11/29/2014 0410   AST 48 (H) 11/29/2014 0410   ALT 42 11/29/2014 0410   ALKPHOS 60 11/29/2014 0410   BILITOT 0.3 11/29/2014 0410   GFRNONAA >60 11/29/2014 0410   GFRAA >60 11/29/2014 0410   Lab Results  Component Value Date   TRIG 76 11/29/2014   No results found for: HGBA1C No results found for: VITAMINB12 No results found for: TSH     ASSESSMENT AND PLAN  82 y.o. year old female here with progressive short-term memory problems, difficulty with activities of daily living including finances and medication management, status post neuropsychological testing consistent with mild neurodegenerative dementia. We discussed diagnosis, prognosis, treatment options. We discussed option to participate in clinical research study. Information given to the patient and daughter will review. Depending on whether they participate in the research study then we may consider further testing and treatment. Also gave community support and resource information to patient's daughter.   Ddx: dementia (likely alzheimer's; mild)  1. Mild dementia      PLAN: - consider CREAD research study - consider MRI brain - consider dementia medications (donepezil or memantine) - stay active physically and mentally  Return in about 1 month (around 10/24/2015).    Penni Bombard, MD [id number], [id number] AM Certified in Neurology, Neurophysiology and Neuroimaging  [facility] [address] [place], [place] [address] [phone]

## 2015-09-23 NOTE — Patient Instructions (Signed)
  Thank you for coming to see [place] at [facility]. I hope we have been able to provide you high quality care today.  You may receive a patient satisfaction survey over the next few weeks. We would appreciate your feedback and comments so that we may continue to improve ourselves and the health of our patients.  - consider CREAD research study  - consider MRI brain  - consider dementia medications (donepezil or memantine)  - stay active physically and mentally   ~~~~~~~~~~~~~~~~~~~~~~~~~~~~~~~~~~~~~~~~~~~~~~~~~~~~~~~~~~~~~~~~~  DR. PENUMALLI'S GUIDE TO HAPPY AND HEALTHY LIVING These are some of my general health and wellness recommendations. Some of them may apply to you better than others. Please use common sense as you try these suggestions and feel free to ask me any questions.   ACTIVITY/FITNESS Mental, social, emotional and physical stimulation are very important for brain and body health. Try learning a new activity (arts, music, language, sports, games).  Keep moving your body to the best of your abilities. You can do this at home, inside or outside, the park, community center, gym or anywhere you like. Consider a physical therapist or personal trainer to get started. Consider the app Sworkit. Fitness trackers such as smart-watches, smart-phones or Fitbits can help as well.   NUTRITION Eat more plants: colorful vegetables, nuts, seeds and berries.  Eat less sugar, salt, preservatives and processed foods.  Avoid toxins such as cigarettes and alcohol.  Drink water when you are thirsty. Warm water with a slice of lemon is an excellent morning drink to start the day.  Consider these websites for more information The Nutrition Source (https://www.henry-hernandez.biz/) [employer] Nutrition (WindowBlog.ch)   RELAXATION Consider practicing mindfulness meditation or other relaxation techniques such as deep breathing,  prayer, yoga, tai chi, massage. See website mindful.org or the apps Headspace or Calm to help get started.   SLEEP Try to get at least 7-8+ hours sleep per day. Regular exercise and reduced caffeine will help you sleep better. Practice good sleep hygeine techniques. See website sleep.org for more information.   PLANNING Prepare estate planning, living will, healthcare POA documents. Sometimes this is best planned with the help of an attorney. Theconversationproject.org and agingwithdignity.org are excellent resources.

## 2015-09-30 DIAGNOSIS — H401112 Primary open-angle glaucoma, right eye, moderate stage: Secondary | ICD-10-CM | POA: Diagnosis not present

## 2015-10-14 DIAGNOSIS — F331 Major depressive disorder, recurrent, moderate: Secondary | ICD-10-CM | POA: Diagnosis not present

## 2015-10-14 DIAGNOSIS — G4700 Insomnia, unspecified: Secondary | ICD-10-CM | POA: Diagnosis not present

## 2015-10-14 DIAGNOSIS — F0390 Unspecified dementia without behavioral disturbance: Secondary | ICD-10-CM | POA: Diagnosis not present

## 2015-10-14 DIAGNOSIS — Z6826 Body mass index (BMI) 26.0-26.9, adult: Secondary | ICD-10-CM | POA: Diagnosis not present

## 2015-10-14 DIAGNOSIS — R630 Anorexia: Secondary | ICD-10-CM | POA: Diagnosis not present

## 2015-10-24 ENCOUNTER — Ambulatory Visit (INDEPENDENT_AMBULATORY_CARE_PROVIDER_SITE_OTHER): Payer: Medicare Other | Admitting: Diagnostic Neuroimaging

## 2015-10-24 ENCOUNTER — Encounter: Payer: Self-pay | Admitting: Diagnostic Neuroimaging

## 2015-10-24 VITALS — BP 101/63 | HR 72 | Wt 139.6 lb

## 2015-10-24 DIAGNOSIS — F03A0 Unspecified dementia, mild, without behavioral disturbance, psychotic disturbance, mood disturbance, and anxiety: Secondary | ICD-10-CM

## 2015-10-24 DIAGNOSIS — F0390 Unspecified dementia without behavioral disturbance: Secondary | ICD-10-CM

## 2015-10-24 NOTE — Progress Notes (Signed)
[facility]  PATIENT: Leslie Frey DOB: 1932-03-26  REFERRING CLINICIAN: S Holwerda HISTORY FROM: patient and daughter REASON FOR VISIT: new consult   HISTORICAL  CHIEF COMPLAINT:  Chief Complaint  Patient presents with  . Dementia    rm 7, dgtrKendrick Fries, MMSE 25  . Follow-up    HISTORY OF PRESENT ILLNESS:   UPDATE 10/24/15: Since last visit, pt is stable. No new events.   PRIOR HPI (09/23/15): 82 year old right-handed female here for evaluation of memory loss. According to patient she has some mild memory problems, but not excessive for age and not that significant. According to patient's daughter, patient has been having at least 4 year history of progressive short-term memory problems, forgetfulness, accusatory and paranoid behaviors. Patient was trying to have trouble paying bills on time, mixing up finances, forgetting to take her medications. In August 2016 patient moved in with her daughter. Patient still able to take care of most of her personal activities of daily living including bathing, hygiene, dressing, feeding. She is able to use coffee maker and microwave. Patient has her own area within daughter's home. Patient still drives short distances to the mall but not to unfamiliar areas. Sometimes patient gets accusatory towards her daughter when she cannot find objects in the home. No report of stepwise decline. No sudden onset focal weakness, numbness, slurred speech. No history of stroke. Of note in 2011-06-16 patient's husband died due to suspected complications of dementia, behavior change, resulting in suicide. Patient then went into severe depression after this.    REVIEW OF SYSTEMS: Full 14 system review of systems performed and negative with exception of: memory loss easy brusing.   ALLERGIES: No Known Allergies  HOME MEDICATIONS: Outpatient Medications Prior to Visit  Medication Sig Dispense Refill  . AMITIZA 24 MCG capsule Take 1 capsule by mouth 2  (two) times daily.    Marland Kitchen aspirin 81 MG chewable tablet Chew 81 mg by mouth daily.    . bumetanide (BUMEX) 1 MG tablet Take 1 tablet by mouth daily.    . carvedilol (COREG) 3.125 MG tablet Take 3.125 mg by mouth 2 (two) times daily.    . COMBIGAN 0.2-0.5 % ophthalmic solution Place 1 drop into both eyes 2 (two) times daily.    . dorzolamide-timolol (COSOPT) 22.3-6.8 MG/ML ophthalmic solution Place 1 drop into both eyes 2 times daily.    . isosorbide mononitrate (IMDUR) 60 MG 24 hr tablet Take 60 mg by mouth.    Marland Kitchen KRILL OIL PO Take by mouth.    Marland Kitchen lisinopril (PRINIVIL,ZESTRIL) 5 MG tablet Take 5 mg by mouth daily.    . mirtazapine (REMERON) 15 MG tablet Take 15 mg by mouth at bedtime.    . nitroGLYCERIN (NITROSTAT) 0.4 MG SL tablet Place 0.4 mg under the tongue every 5 (five) minutes as needed for chest pain (3 doses max).     Marland Kitchen omeprazole (PRILOSEC) 40 MG capsule Take 40 mg by mouth 2 (two) times daily.    Marland Kitchen OVER THE COUNTER MEDICATION Take 1 tablet by mouth daily. "Detoxin with chlorella."    . simvastatin (ZOCOR) 40 MG tablet Take 40 mg by mouth every evening.    . traZODone (DESYREL) 50 MG tablet Take 60 mg by mouth daily.     No facility-administered medications prior to visit.     PAST MEDICAL HISTORY: Past Medical History:  Diagnosis Date  . Arthritis   . GERD (gastroesophageal reflux disease)   . Heart disease   .  Hiatal hernia   . Hypercholesterolemia   . Hypertension   . IBS (irritable bowel syndrome)     PAST SURGICAL HISTORY: Past Surgical History:  Procedure Laterality Date  . ABDOMINAL HYSTERECTOMY  1986  . APPENDECTOMY  1973  . CARDIAC CATHETERIZATION    . CATARACT EXTRACTION  2006  . CHOLECYSTECTOMY  1973  . EYE SURGERY    . LAPAROSCOPIC LYSIS OF ADHESIONS  11/23/2014   Procedure: LAPAROSCOPIC LYSIS OF ADHESIONS;  Surgeon: Leighton Ruff, MD;  Location: [facility];  Service: General;;  . LAPAROSCOPY N/A 11/23/2014   Procedure: DIAGNOSTIC LAPAROSCOPY;  Surgeon: Leighton Ruff, MD;  Location: [facility];  Service: General;  Laterality: N/A;  . Meniscus Tear  2009  . ULNAR NERVE REPAIR Left 2013    FAMILY HISTORY: Family History  Problem Relation Age of Onset  . Diabetes Mother   . Hypertension Mother   . Heart attack Mother   . Heart attack Father   . Diabetes Sister   . Hypertension Brother   . Heart attack Brother   . Heart attack Sister   . Stroke Sister     SOCIAL HISTORY:  Social History   Social History  . Marital status: Widowed    Spouse name: N/A  . Number of children: 3  . Years of education: 12   Occupational History  . Not on file.   Social History Main Topics  . Smoking status: Former Smoker    Packs/day: 1.00    Years: 2.00    Types: Cigarettes    Quit date: 10/27/1989  . Smokeless tobacco: Never Used  . Alcohol use 0.0 oz/week     Comment: Occasional - 1 glass of wine per week  . Drug use: No  . Sexual activity: Not on file   Other Topics Concern  . Not on file   Social History Narrative   Lives with daughter, Kendrick Fries   Caffeine- coffee 3 cups daily     PHYSICAL EXAM  GENERAL EXAM/CONSTITUTIONAL: Vitals:  Vitals:   10/24/15 1321  BP: 101/63  Pulse: 72  Weight: 139 lb 9.6 oz (63.3 kg)   Body mass index is 27.26 kg/m. No exam data present  Patient is in no distress; well developed, nourished and groomed; neck is supple  CARDIOVASCULAR:  Examination of carotid arteries is normal; no carotid bruits  Regular rate and rhythm, no murmurs  Examination of peripheral vascular system by observation and palpation is normal  EYES:  Ophthalmoscopic exam of optic discs and posterior segments is normal; no papilledema or hemorrhages  MUSCULOSKELETAL:  Gait, strength, tone, movements noted in Neurologic exam below  NEUROLOGIC: MENTAL STATUS:  MMSE - [place] Exam 10/24/2015 09/23/2015  Orientation to time 4 4  Orientation to Place 4 3  Registration 3 3  Attention/ Calculation 4 4  Recall 2 1    Language- name 2 objects 2 2  Language- repeat 1 1  Language- follow 3 step command 3 3  Language- read & follow direction 1 1  Write a sentence 1 1  Copy design 0 1  Total score 25 24    awake, alert, oriented to person, place and time; NOT [place]; comprehension intact, naming intact,   fund of knowledge appropriate  FLAT AFFECT  ANIMAL FLUENCY TESTING = 5  CRANIAL NERVE:   2nd - no papilledema on fundoscopic exam  2nd, 3rd, 4th, 6th - pupils equal and reactive  to light, visual fields full to confrontation, extraocular muscles intact, no nystagmus  5th - facial sensation symmetric  7th - facial strength symmetric  8th - hearing intact  9th - palate elevates symmetrically, uvula midline  11th - shoulder shrug symmetric  12th - tongue protrusion midline  MOTOR:   normal bulk and tone, full strength in the BUE, BLE  SENSORY:   normal and symmetric to light touch, temperature, vibration  COORDINATION:   finger-nose-finger, fine finger movements normal  REFLEXES:   deep tendon reflexes present and symmetric  POSITIVE SNOUT AND MYERSONS REFLEXES  GAIT/STATION:   narrow based gait    DIAGNOSTIC DATA (LABS, IMAGING, TESTING) - I reviewed patient records, labs, notes, testing and imaging myself where available.  Lab Results  Component Value Date   WBC 6.9 11/29/2014   HGB 9.3 (L) 11/29/2014   HCT 27.9 (L) 11/29/2014   MCV 91.8 11/29/2014   PLT 211 11/29/2014      Component Value Date/Time   NA 136 11/29/2014 0410   K 4.1 11/29/2014 0410   CL 105 11/29/2014 0410   CO2 27 11/29/2014 0410   GLUCOSE 124 (H) 11/29/2014 0410   BUN 19 11/29/2014 0410   CREATININE 0.73 11/29/2014 0410   CALCIUM 8.2 (L) 11/29/2014 0410   PROT 5.1 (L) 11/29/2014 0410   ALBUMIN 2.4 (L) 11/29/2014 0410   AST 48 (H) 11/29/2014 0410   ALT 42 11/29/2014 0410   ALKPHOS 60 11/29/2014 0410   BILITOT 0.3  11/29/2014 0410   GFRNONAA >60 11/29/2014 0410   GFRAA >60 11/29/2014 0410   Lab Results  Component Value Date   TRIG 76 11/29/2014   No results found for: HGBA1C No results found for: VITAMINB12 No results found for: TSH     ASSESSMENT AND PLAN  82 y.o. year old female here with progressive short-term memory problems, difficulty with activities of daily living including finances and medication management, status post neuropsychological testing consistent with mild neurodegenerative dementia. We discussed diagnosis, prognosis, treatment options. We discussed option to participate in clinical research study. Information given to the patient and daughter. Depending on whether they participate in the research study then we may consider further testing and treatment. Also gave community support and resource information to patient's daughter.   Ddx: dementia (likely alzheimer's; mild)  1. Mild dementia      PLAN: - considered CREAD research study --> patient declined - consider MRI brain --> patient declined - consider dementia medications (donepezil or memantine) --> patient declined - stay active physically and mentally  Return in about 6 months (around 04/25/2016).    Penni Bombard, MD [id number], [id number] PM Certified in Neurology, Neurophysiology and Neuroimaging  [facility] [address] [place], [place] [address] [phone]

## 2015-10-24 NOTE — Patient Instructions (Signed)
-   monitor symptoms  - consider adult [facility] for [facility]

## 2015-11-08 DIAGNOSIS — Z6826 Body mass index (BMI) 26.0-26.9, adult: Secondary | ICD-10-CM | POA: Diagnosis not present

## 2015-11-08 DIAGNOSIS — N644 Mastodynia: Secondary | ICD-10-CM | POA: Diagnosis not present

## 2015-11-10 ENCOUNTER — Other Ambulatory Visit: Payer: Self-pay | Admitting: Registered Nurse

## 2015-11-10 DIAGNOSIS — N644 Mastodynia: Secondary | ICD-10-CM

## 2015-11-15 ENCOUNTER — Ambulatory Visit
Admission: RE | Admit: 2015-11-15 | Discharge: 2015-11-15 | Disposition: A | Discharge status: Home / Self Care | Payer: Medicare Other | Source: Ambulatory Visit | Attending: Registered Nurse | Admitting: Registered Nurse

## 2015-11-15 DIAGNOSIS — N644 Mastodynia: Secondary | ICD-10-CM

## 2015-11-16 DIAGNOSIS — H00015 Hordeolum externum left lower eyelid: Secondary | ICD-10-CM | POA: Diagnosis not present

## 2015-12-21 DIAGNOSIS — Z23 Encounter for immunization: Secondary | ICD-10-CM | POA: Diagnosis not present

## 2015-12-26 DIAGNOSIS — H401112 Primary open-angle glaucoma, right eye, moderate stage: Secondary | ICD-10-CM | POA: Diagnosis not present

## 2015-12-26 DIAGNOSIS — H401121 Primary open-angle glaucoma, left eye, mild stage: Secondary | ICD-10-CM | POA: Diagnosis not present

## 2016-02-29 DIAGNOSIS — I10 Essential (primary) hypertension: Secondary | ICD-10-CM | POA: Diagnosis not present

## 2016-02-29 DIAGNOSIS — Z780 Asymptomatic menopausal state: Secondary | ICD-10-CM | POA: Diagnosis not present

## 2016-02-29 DIAGNOSIS — E784 Other hyperlipidemia: Secondary | ICD-10-CM | POA: Diagnosis not present

## 2016-03-07 DIAGNOSIS — Z1389 Encounter for screening for other disorder: Secondary | ICD-10-CM | POA: Diagnosis not present

## 2016-03-07 DIAGNOSIS — F325 Major depressive disorder, single episode, in full remission: Secondary | ICD-10-CM | POA: Diagnosis not present

## 2016-03-07 DIAGNOSIS — F0390 Unspecified dementia without behavioral disturbance: Secondary | ICD-10-CM | POA: Diagnosis not present

## 2016-03-07 DIAGNOSIS — I25118 Atherosclerotic heart disease of native coronary artery with other forms of angina pectoris: Secondary | ICD-10-CM | POA: Diagnosis not present

## 2016-03-07 DIAGNOSIS — I10 Essential (primary) hypertension: Secondary | ICD-10-CM | POA: Diagnosis not present

## 2016-03-07 DIAGNOSIS — R630 Anorexia: Secondary | ICD-10-CM | POA: Diagnosis not present

## 2016-03-07 DIAGNOSIS — J019 Acute sinusitis, unspecified: Secondary | ICD-10-CM | POA: Diagnosis not present

## 2016-03-07 DIAGNOSIS — K219 Gastro-esophageal reflux disease without esophagitis: Secondary | ICD-10-CM | POA: Diagnosis not present

## 2016-03-07 DIAGNOSIS — Z0000 Encounter for general adult medical examination without abnormal findings: Secondary | ICD-10-CM | POA: Diagnosis not present

## 2016-03-07 DIAGNOSIS — E784 Other hyperlipidemia: Secondary | ICD-10-CM | POA: Diagnosis not present

## 2016-03-07 DIAGNOSIS — Z6828 Body mass index (BMI) 28.0-28.9, adult: Secondary | ICD-10-CM | POA: Diagnosis not present

## 2016-03-29 DIAGNOSIS — M25562 Pain in left knee: Secondary | ICD-10-CM | POA: Diagnosis not present

## 2016-03-29 DIAGNOSIS — Z79899 Other long term (current) drug therapy: Secondary | ICD-10-CM | POA: Diagnosis not present

## 2016-03-29 DIAGNOSIS — Z6827 Body mass index (BMI) 27.0-27.9, adult: Secondary | ICD-10-CM | POA: Diagnosis not present

## 2016-03-29 DIAGNOSIS — M859 Disorder of bone density and structure, unspecified: Secondary | ICD-10-CM | POA: Diagnosis not present

## 2016-04-25 ENCOUNTER — Ambulatory Visit (INDEPENDENT_AMBULATORY_CARE_PROVIDER_SITE_OTHER): Payer: Medicare Other | Admitting: Diagnostic Neuroimaging

## 2016-04-25 ENCOUNTER — Encounter: Payer: Self-pay | Admitting: Diagnostic Neuroimaging

## 2016-04-25 VITALS — BP 94/59 | HR 71 | Wt 145.8 lb

## 2016-04-25 DIAGNOSIS — F0390 Unspecified dementia without behavioral disturbance: Secondary | ICD-10-CM | POA: Diagnosis not present

## 2016-04-25 DIAGNOSIS — F03A0 Unspecified dementia, mild, without behavioral disturbance, psychotic disturbance, mood disturbance, and anxiety: Secondary | ICD-10-CM

## 2016-04-25 NOTE — Progress Notes (Signed)
[facility]  PATIENT: Leslie Frey DOB: Mar 29, 1932  REFERRING CLINICIAN: S Holwerda HISTORY FROM: patient and daughter REASON FOR VISIT: follow up   HISTORICAL  CHIEF COMPLAINT:  Chief Complaint  Patient presents with  . Dementia    rm 6, dgtrKendrick Fries, living at [facility]- independent living, MMSE  24  . Follow-up    6 month    HISTORY OF PRESENT ILLNESS:   UPDATE 04/25/16: Since last visit, doing well. Now living in independent living [facility]) 2 weeks ago. Having some trouble remembering to take meds. Now on donepezil.   UPDATE 10/24/15: Since last visit, pt is stable. No new events.   PRIOR HPI (09/23/15): 83 year old right-handed female here for evaluation of memory loss. According to patient she has some mild memory problems, but not excessive for age and not that significant. According to patient's daughter, patient has been having at least 4 year history of progressive short-term memory problems, forgetfulness, accusatory and paranoid behaviors. Patient was trying to have trouble paying bills on time, mixing up finances, forgetting to take her medications. In August 2016 patient moved in with her daughter. Patient still able to take care of most of her personal activities of daily living including bathing, hygiene, dressing, feeding. She is able to use coffee maker and microwave. Patient has her own area within daughter's home. Patient still drives short distances to the mall but not to unfamiliar areas. Sometimes patient gets accusatory towards her daughter when she cannot find objects in the home. No report of stepwise decline. No sudden onset focal weakness, numbness, slurred speech. No history of stroke. Of note in June 24, 2011 patient's husband died due to suspected complications of dementia, behavior change, resulting in suicide. Patient then went into severe depression after this.   REVIEW OF SYSTEMS: Full 14 system review of systems performed and  negative with exception of: joint pain memory loss.  ALLERGIES: No Known Allergies  HOME MEDICATIONS: Outpatient Medications Prior to Visit  Medication Sig Dispense Refill  . AMITIZA 24 MCG capsule Take 1 capsule by mouth 2 (two) times daily.    Marland Kitchen aspirin 81 MG chewable tablet Chew 81 mg by mouth daily.    . bumetanide (BUMEX) 1 MG tablet Take 1 tablet by mouth daily.    . carvedilol (COREG) 3.125 MG tablet Take 3.125 mg by mouth 2 (two) times daily.    . dorzolamide-timolol (COSOPT) 22.3-6.8 MG/ML ophthalmic solution Place 1 drop into both eyes 2 times daily.    . isosorbide mononitrate (IMDUR) 60 MG 24 hr tablet Take 60 mg by mouth.    Marland Kitchen lisinopril (PRINIVIL,ZESTRIL) 5 MG tablet Take 5 mg by mouth daily.    . mirtazapine (REMERON) 15 MG tablet Take 15 mg by mouth at bedtime.    . nitroGLYCERIN (NITROSTAT) 0.4 MG SL tablet Place 0.4 mg under the tongue every 5 (five) minutes as needed for chest pain (3 doses max).     Marland Kitchen omeprazole (PRILOSEC) 40 MG capsule Take 40 mg by mouth 2 (two) times daily.    . simvastatin (ZOCOR) 40 MG tablet Take 40 mg by mouth every evening.    . COMBIGAN 0.2-0.5 % ophthalmic solution Place 1 drop into both eyes 2 (two) times daily.    Marland Kitchen KRILL OIL PO Take by mouth.    Marland Kitchen OVER THE COUNTER MEDICATION Take 1 tablet by mouth daily. "Detoxin with chlorella."     No facility-administered medications prior to visit.     PAST MEDICAL HISTORY:  Past Medical History:  Diagnosis Date  . Arthritis   . GERD (gastroesophageal reflux disease)   . Heart disease   . Hiatal hernia   . Hypercholesterolemia   . Hypertension   . IBS (irritable bowel syndrome)     PAST SURGICAL HISTORY: Past Surgical History:  Procedure Laterality Date  . ABDOMINAL HYSTERECTOMY  1986  . APPENDECTOMY  1973  . CARDIAC CATHETERIZATION    . CATARACT EXTRACTION  2006  . CHOLECYSTECTOMY  1973  . EYE SURGERY    . LAPAROSCOPIC LYSIS OF ADHESIONS  11/23/2014   Procedure: LAPAROSCOPIC LYSIS OF  ADHESIONS;  Surgeon: Leighton Ruff, MD;  Location: [facility];  Service: General;;  . LAPAROSCOPY N/A 11/23/2014   Procedure: DIAGNOSTIC LAPAROSCOPY;  Surgeon: Leighton Ruff, MD;  Location: [facility];  Service: General;  Laterality: N/A;  . Meniscus Tear  2009  . ULNAR NERVE REPAIR Left 2013    FAMILY HISTORY: Family History  Problem Relation Age of Onset  . Diabetes Mother   . Hypertension Mother   . Heart attack Mother   . Heart attack Father   . Diabetes Sister   . Hypertension Brother   . Heart attack Brother   . Heart attack Sister   . Stroke Sister     SOCIAL HISTORY:  Social History   Social History  . Marital status: Widowed    Spouse name: N/A  . Number of children: 3  . Years of education: 12   Occupational History  . Not on file.   Social History Main Topics  . Smoking status: Former Smoker    Packs/day: 1.00    Years: 2.00    Types: Cigarettes    Quit date: 10/27/1989  . Smokeless tobacco: Never Used  . Alcohol use 0.0 oz/week     Comment: Occasional - 1 glass of wine per week  . Drug use: No  . Sexual activity: Not on file   Other Topics Concern  . Not on file   Social History Narrative   Lives with daughter, Kendrick Fries   Caffeine- coffee 3 cups daily     PHYSICAL EXAM  GENERAL EXAM/CONSTITUTIONAL: Vitals:  Vitals:   04/25/16 1308  BP: (!) 94/59  Pulse: 71  Weight: 145 lb 12.8 oz (66.1 kg)   Body mass index is 28.47 kg/m. No exam data present  Patient is in no distress; well developed, nourished and groomed; neck is supple  CARDIOVASCULAR:  Examination of carotid arteries is normal; no carotid bruits  Regular rate and rhythm, no murmurs  Examination of peripheral vascular system by observation and palpation is normal  EYES:  Ophthalmoscopic exam of optic discs and posterior segments is normal; no papilledema or hemorrhages  MUSCULOSKELETAL:  Gait, strength, tone, movements noted in Neurologic exam below  NEUROLOGIC: MENTAL STATUS:   MMSE - [place] Exam 04/25/2016 10/24/2015 09/23/2015  Orientation to time 5 4 4   Orientation to Place 3 4 3   Registration 3 3 3   Attention/ Calculation 3 4 4   Recall 1 2 1   Language- name 2 objects 2 2 2   Language- repeat 1 1 1   Language- follow 3 step command 3 3 3   Language- read & follow direction 1 1 1   Write a sentence 1 1 1   Copy design 1 0 1  Total score 24 25 24     awake, alert, oriented to person, place and time; NOT [place]; comprehension intact,  naming intact,   fund of knowledge appropriate  ANIMAL FLUENCY TESTING = 6  CANNOT DRAW HANDS ON CLOCK  CRANIAL NERVE:   2nd - no papilledema on fundoscopic exam  2nd, 3rd, 4th, 6th - pupils equal and reactive to light, visual fields full to confrontation, extraocular muscles intact, no nystagmus  5th - facial sensation symmetric  7th - facial strength symmetric  8th - hearing intact  9th - palate elevates symmetrically, uvula midline  11th - shoulder shrug symmetric  12th - tongue protrusion midline  MOTOR:   normal bulk and tone, full strength in the BUE, BLE  SENSORY:   normal and symmetric to light touch, temperature, vibration  COORDINATION:   finger-nose-finger, fine finger movements normal  REFLEXES:   deep tendon reflexes present and symmetric  POSITIVE SNOUT AND MYERSONS REFLEXES  GAIT/STATION:   narrow based gait    DIAGNOSTIC DATA (LABS, IMAGING, TESTING) - I reviewed patient records, labs, notes, testing and imaging myself where available.  Lab Results  Component Value Date   WBC 6.9 11/29/2014   HGB 9.3 (L) 11/29/2014   HCT 27.9 (L) 11/29/2014   MCV 91.8 11/29/2014   PLT 211 11/29/2014      Component Value Date/Time   NA 136 11/29/2014 0410   K 4.1 11/29/2014 0410   CL 105 11/29/2014 0410   CO2 27 11/29/2014 0410   GLUCOSE 124 (H) 11/29/2014 0410   BUN 19 11/29/2014 0410   CREATININE 0.73  11/29/2014 0410   CALCIUM 8.2 (L) 11/29/2014 0410   PROT 5.1 (L) 11/29/2014 0410   ALBUMIN 2.4 (L) 11/29/2014 0410   AST 48 (H) 11/29/2014 0410   ALT 42 11/29/2014 0410   ALKPHOS 60 11/29/2014 0410   BILITOT 0.3 11/29/2014 0410   GFRNONAA >60 11/29/2014 0410   GFRAA >60 11/29/2014 0410   Lab Results  Component Value Date   TRIG 76 11/29/2014   No results found for: HGBA1C No results found for: VITAMINB12 No results found for: TSH     ASSESSMENT AND PLAN  83 y.o. year old female here with progressive short-term memory problems, difficulty with activities of daily living including finances and medication management, status post neuropsychological testing consistent with mild neurodegenerative dementia.   We discussed diagnosis, prognosis, treatment options. We discussed option to participate in clinical research study. Information given to the patient and daughter.   Also gave community support and resource information to patient's daughter.   Dx: dementia (likely alzheimer's; mild)  1. Mild dementia      PLAN: I spent 15 minutes of face to face time with patient. Greater than 50% of time was spent in counseling and coordination of care with patient. In summary we discussed:  - considered research studies --> patient declined - consider MRI brain --> patient declined - continue dementia medications (donepezil); may add memantine in future - stay active physically and mentally - continue to monitor safety and supervision  Return if symptoms worsen or fail to improve, for return to PCP.    Penni Bombard, MD [id number], [id number] PM Certified in Neurology, Neurophysiology and Neuroimaging  [facility] [address] [place], [place] [address] [phone]

## 2016-04-25 NOTE — Patient Instructions (Signed)
-   continue donepezil

## 2016-04-30 DIAGNOSIS — H401121 Primary open-angle glaucoma, left eye, mild stage: Secondary | ICD-10-CM | POA: Diagnosis not present

## 2016-04-30 DIAGNOSIS — H401112 Primary open-angle glaucoma, right eye, moderate stage: Secondary | ICD-10-CM | POA: Diagnosis not present

## 2016-07-03 DIAGNOSIS — Z87891 Personal history of nicotine dependence: Secondary | ICD-10-CM | POA: Diagnosis not present

## 2016-07-03 DIAGNOSIS — E785 Hyperlipidemia, unspecified: Secondary | ICD-10-CM | POA: Diagnosis not present

## 2016-07-03 DIAGNOSIS — Z9849 Cataract extraction status, unspecified eye: Secondary | ICD-10-CM | POA: Diagnosis not present

## 2016-07-03 DIAGNOSIS — H00014 Hordeolum externum left upper eyelid: Secondary | ICD-10-CM | POA: Diagnosis not present

## 2016-07-03 DIAGNOSIS — H0289 Other specified disorders of eyelid: Secondary | ICD-10-CM | POA: Diagnosis not present

## 2016-07-03 DIAGNOSIS — I10 Essential (primary) hypertension: Secondary | ICD-10-CM | POA: Diagnosis not present

## 2016-07-03 DIAGNOSIS — Z79899 Other long term (current) drug therapy: Secondary | ICD-10-CM | POA: Diagnosis not present

## 2016-07-21 IMAGING — CR DG ABDOMEN 2V
1 series · 1 of 1 positions shown · non-contrast
Comparison: 11/20/2014

CLINICAL DATA: Abdominal distention this morning. Follow-up small
bowel obstruction.

EXAM:
ABDOMEN - 2 VIEW

[view not recorded]
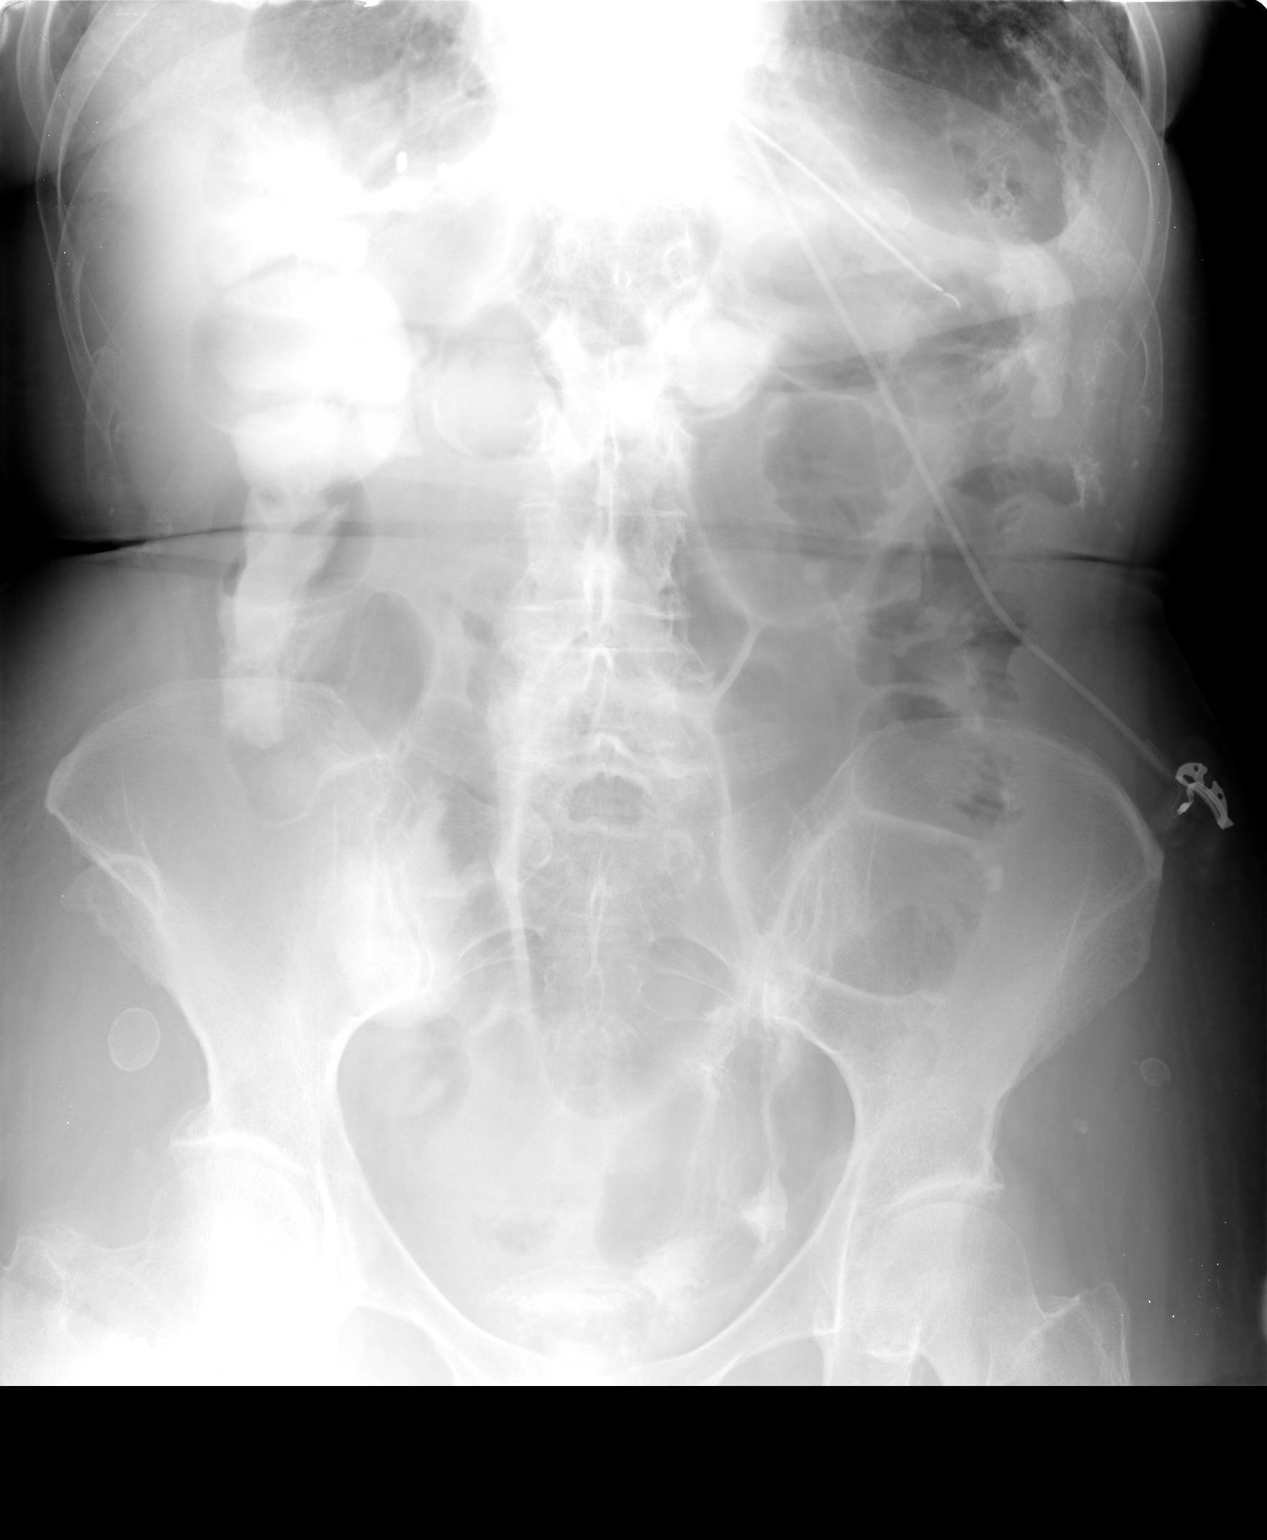

[1 of 1 positions shown; findings below may reference images not displayed]

FINDINGS: Degree of small bowel distention decreased since prior study. Mildly
prominent sigmoid colon loops in the lower abdomen and upper pelvis.
No free air. No organomegaly. Oral contrast material noted within
the right colon and transverse colon.
IMPRESSION: Improving small bowel distention. Mild gaseous distention of the
colon currently.

## 2016-07-23 IMAGING — CR DG ABDOMEN 2V
1 series · 1 of 1 positions shown · non-contrast
Comparison: Plain films of the abdomen 11/18/2014, 11/20/2014 and
11/21/2014.

CLINICAL DATA: Mid abdominal pain.  Small-bowel obstruction.

EXAM:
ABDOMEN - 2 VIEW

[w abdomen decub *]
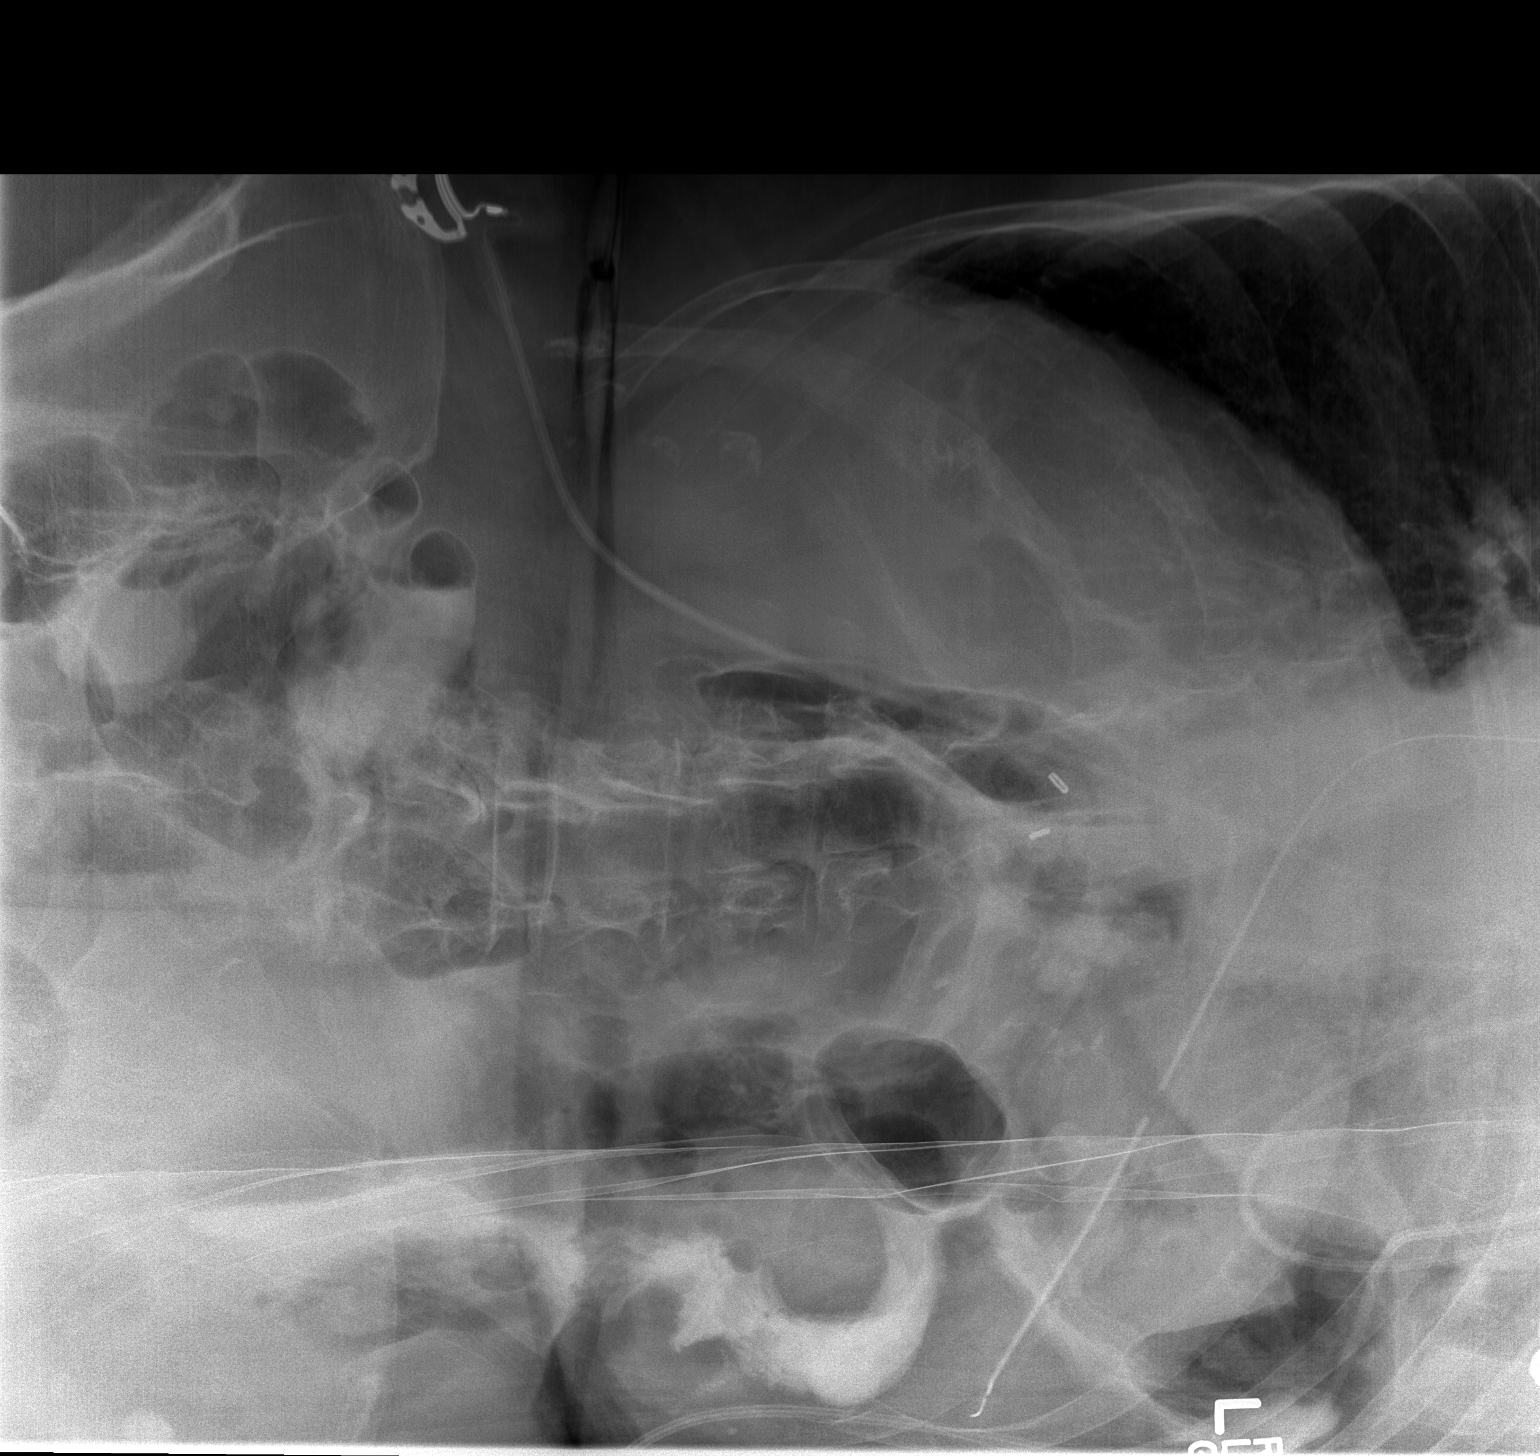

[1 of 1 positions shown; findings below may reference images not displayed]

FINDINGS: No free intraperitoneal air is identified. Contrast material in the
colon seen on the prior examination has progressed somewhat. There
is dilatation of small bowel with loops measuring up to 4.0 cm.
Gaseous distention of small bowel has improved since yesterday's
examination. NG tube is in place.
IMPRESSION: Continued improvement in small bowel obstruction. No new
abnormality.

## 2016-07-24 IMAGING — CR DG CHEST 1V PORT
1 series · 1 of 1 positions shown · non-contrast
Comparison: 11/15/2014

ADDENDUM:
OG tube projects over the stomach and dilated small bowel loops are
partially visualized on this examination consistent with history of
small bowel obstruction.
CLINICAL DATA: Small bowel obstruction, cough today

EXAM:
PORTABLE CHEST 1 VIEW

[AP]
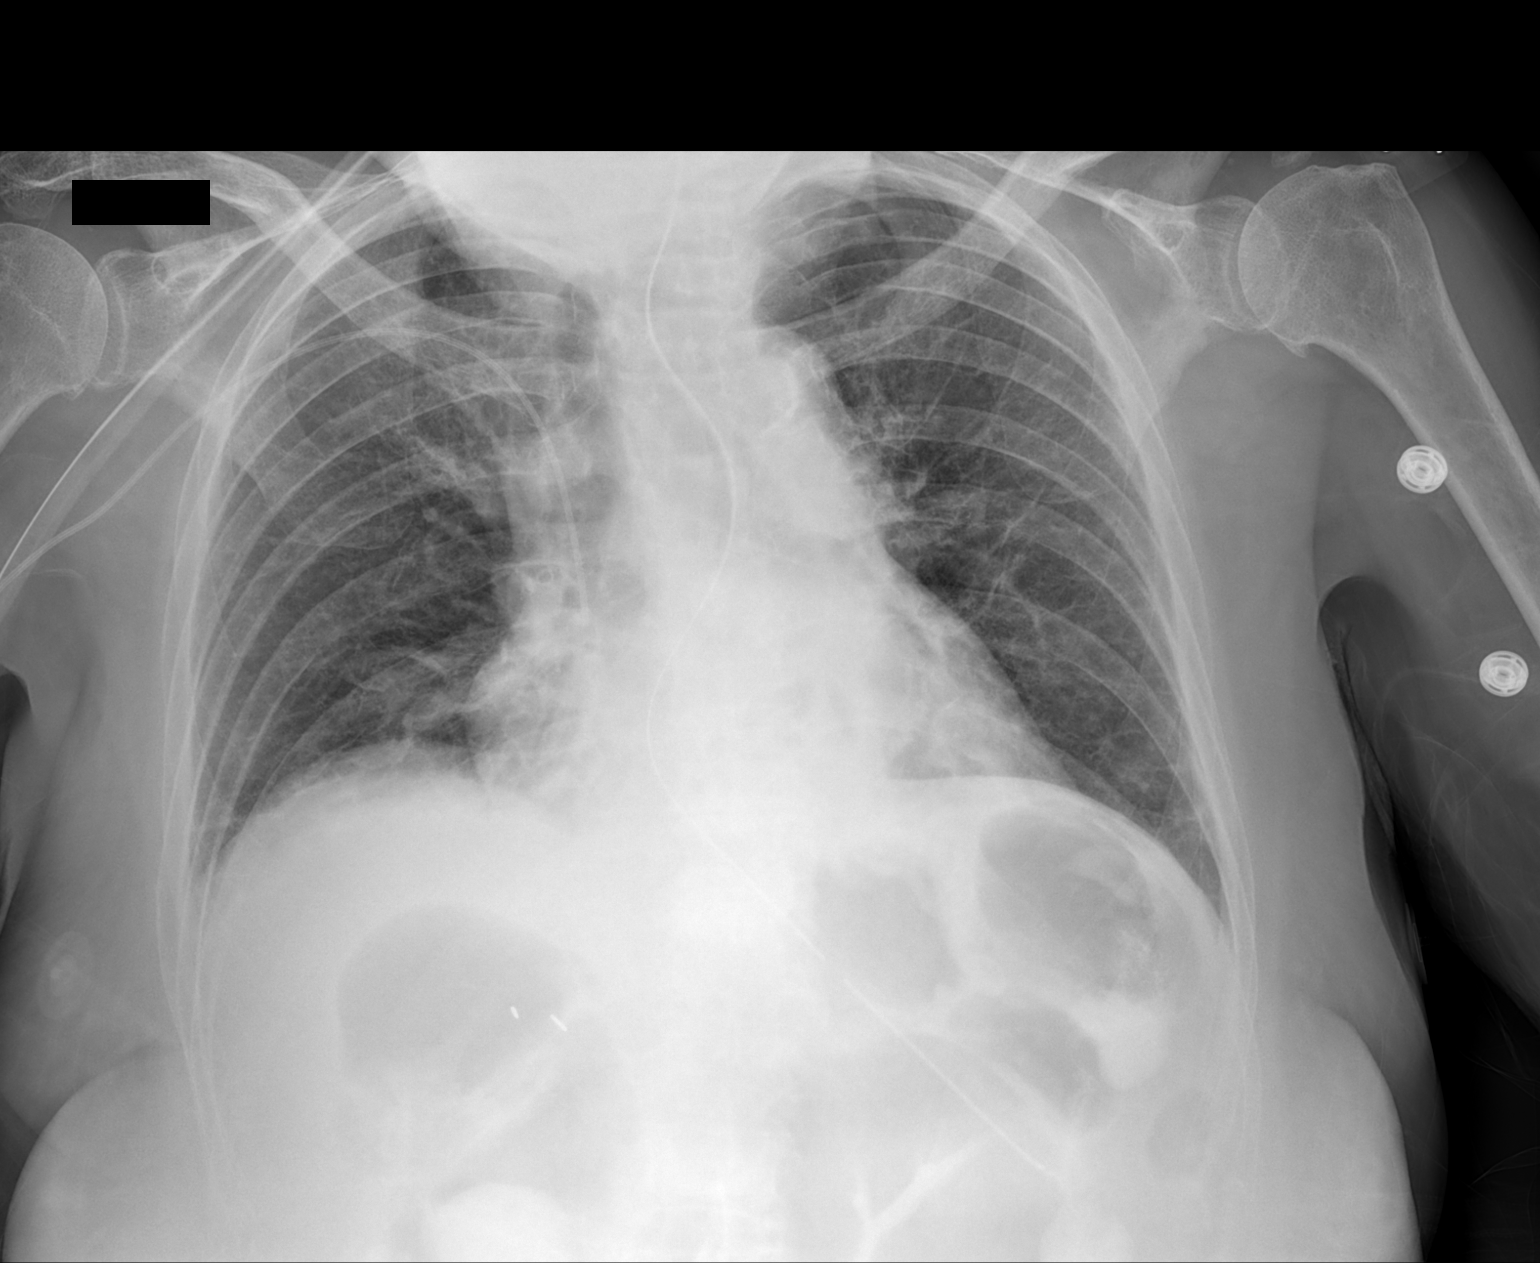

[1 of 1 positions shown; findings below may reference images not displayed]

FINDINGS: Mild cardiac enlargement stable. Right PICC line identified with tip
at the cavoatrial junction. Moderate to large hiatal hernia.
Vascular pattern normal. Mild bibasilar atelectasis.
IMPRESSION: No acute findings.  Mild bilateral lower lobe atelectasis.

## 2016-10-10 DIAGNOSIS — H401122 Primary open-angle glaucoma, left eye, moderate stage: Secondary | ICD-10-CM | POA: Diagnosis not present

## 2016-10-10 DIAGNOSIS — H401112 Primary open-angle glaucoma, right eye, moderate stage: Secondary | ICD-10-CM | POA: Diagnosis not present

## 2016-11-05 DIAGNOSIS — H401112 Primary open-angle glaucoma, right eye, moderate stage: Secondary | ICD-10-CM | POA: Diagnosis not present

## 2016-11-05 DIAGNOSIS — H401121 Primary open-angle glaucoma, left eye, mild stage: Secondary | ICD-10-CM | POA: Diagnosis not present

## 2016-11-24 DIAGNOSIS — Z23 Encounter for immunization: Secondary | ICD-10-CM | POA: Diagnosis not present

## 2016-12-20 DIAGNOSIS — H401132 Primary open-angle glaucoma, bilateral, moderate stage: Secondary | ICD-10-CM | POA: Diagnosis not present

## 2017-03-04 DIAGNOSIS — M859 Disorder of bone density and structure, unspecified: Secondary | ICD-10-CM | POA: Diagnosis not present

## 2017-03-04 DIAGNOSIS — I10 Essential (primary) hypertension: Secondary | ICD-10-CM | POA: Diagnosis not present

## 2017-03-04 DIAGNOSIS — R82998 Other abnormal findings in urine: Secondary | ICD-10-CM | POA: Diagnosis not present

## 2017-03-04 DIAGNOSIS — E7849 Other hyperlipidemia: Secondary | ICD-10-CM | POA: Diagnosis not present

## 2017-03-11 DIAGNOSIS — M859 Disorder of bone density and structure, unspecified: Secondary | ICD-10-CM | POA: Diagnosis not present

## 2017-03-11 DIAGNOSIS — Z0000 Encounter for general adult medical examination without abnormal findings: Secondary | ICD-10-CM | POA: Diagnosis not present

## 2017-03-11 DIAGNOSIS — E559 Vitamin D deficiency, unspecified: Secondary | ICD-10-CM | POA: Diagnosis not present

## 2017-03-11 DIAGNOSIS — I25118 Atherosclerotic heart disease of native coronary artery with other forms of angina pectoris: Secondary | ICD-10-CM | POA: Diagnosis not present

## 2017-03-11 DIAGNOSIS — Z6828 Body mass index (BMI) 28.0-28.9, adult: Secondary | ICD-10-CM | POA: Diagnosis not present

## 2017-03-11 DIAGNOSIS — Z1389 Encounter for screening for other disorder: Secondary | ICD-10-CM | POA: Diagnosis not present

## 2017-03-11 DIAGNOSIS — F325 Major depressive disorder, single episode, in full remission: Secondary | ICD-10-CM | POA: Diagnosis not present

## 2017-03-11 DIAGNOSIS — B351 Tinea unguium: Secondary | ICD-10-CM | POA: Diagnosis not present

## 2017-03-11 DIAGNOSIS — F0390 Unspecified dementia without behavioral disturbance: Secondary | ICD-10-CM | POA: Diagnosis not present

## 2017-03-11 DIAGNOSIS — R630 Anorexia: Secondary | ICD-10-CM | POA: Diagnosis not present

## 2017-03-11 DIAGNOSIS — M25562 Pain in left knee: Secondary | ICD-10-CM | POA: Diagnosis not present

## 2017-03-11 DIAGNOSIS — H4089 Other specified glaucoma: Secondary | ICD-10-CM | POA: Diagnosis not present

## 2017-03-18 ENCOUNTER — Ambulatory Visit (INDEPENDENT_AMBULATORY_CARE_PROVIDER_SITE_OTHER): Payer: Medicare Other | Admitting: Orthopaedic Surgery

## 2017-03-18 ENCOUNTER — Ambulatory Visit (INDEPENDENT_AMBULATORY_CARE_PROVIDER_SITE_OTHER): Payer: Medicare Other

## 2017-03-18 ENCOUNTER — Other Ambulatory Visit (HOSPITAL_COMMUNITY): Payer: Self-pay | Admitting: *Deleted

## 2017-03-18 ENCOUNTER — Encounter (INDEPENDENT_AMBULATORY_CARE_PROVIDER_SITE_OTHER): Payer: Self-pay | Admitting: Orthopaedic Surgery

## 2017-03-18 DIAGNOSIS — M1712 Unilateral primary osteoarthritis, left knee: Secondary | ICD-10-CM

## 2017-03-18 DIAGNOSIS — M1711 Unilateral primary osteoarthritis, right knee: Secondary | ICD-10-CM | POA: Diagnosis not present

## 2017-03-18 MED ORDER — LIDOCAINE HCL 1 % IJ SOLN
2.0000 mL | INTRAMUSCULAR | Status: AC | PRN
  Administered 2017-03-18: 2 mL

## 2017-03-18 MED ORDER — METHYLPREDNISOLONE ACETATE 40 MG/ML IJ SUSP
40.0000 mg | INTRAMUSCULAR | Status: AC | PRN
  Administered 2017-03-18: 40 mg via INTRA_ARTICULAR

## 2017-03-18 MED ORDER — BUPIVACAINE HCL 0.5 % IJ SOLN
2.0000 mL | INTRAMUSCULAR | Status: AC | PRN
  Administered 2017-03-18: 2 mL via INTRA_ARTICULAR

## 2017-03-18 MED ORDER — DICLOFENAC SODIUM 1 % TD GEL
2.0000 g | Freq: Four times a day (QID) | TRANSDERMAL | 5 refills | Status: DC
Start: 2017-03-18 — End: 2018-09-15

## 2017-03-18 MED ORDER — MELOXICAM 7.5 MG PO TABS: 7.5000 mg | ORAL_TABLET | Freq: Two times a day (BID) | ORAL | 2 refills | Status: AC | PRN

## 2017-03-18 NOTE — Progress Notes (Signed)
Office Visit Note   Patient: Leslie Frey           Date of Birth: [birth year]           MRN: [id number] Visit Date: 03/18/2017              Requested by: Velna Hatchet, MD [address] [place], [place] [address] PCP: Velna Hatchet, MD   Assessment & Plan: Visit Diagnoses:  1. Primary osteoarthritis of right knee   2. Primary osteoarthritis of left knee     Plan: Impression is bilateral valgus knee degenerative joint disease.  Bilateral knee injections were performed today.  Prescription for Voltaren gel and meloxicam.  Questions encouraged and answered.  Follow-up as needed.  Follow-Up Instructions: Return if symptoms worsen or fail to improve.   Orders:  Orders Placed This Encounter  Procedures  . XR Knee 1-2 Views Left  . XR Knee 1-2 Views Right   Meds ordered this encounter  Medications  . diclofenac sodium (VOLTAREN) 1 % GEL    Sig: Apply 2 g topically 4 (four) times daily.    Dispense:  1 Tube    Refill:  5  . meloxicam (MOBIC) 7.5 MG tablet    Sig: Take 1 tablet (7.5 mg total) by mouth 2 (two) times daily as needed for pain.    Dispense:  30 tablet    Refill:  2      Procedures: Large Joint Inj: bilateral knee on 03/18/2017 6:52 PM Indications: pain Details: 22 G needle  Arthrogram: No  Medications (Right): 2 mL lidocaine 1 %; 2 mL bupivacaine 0.5 %; 40 mg methylPREDNISolone acetate 40 MG/ML Medications (Left): 2 mL lidocaine 1 %; 2 mL bupivacaine 0.5 %; 40 mg methylPREDNISolone acetate 40 MG/ML Outcome: tolerated well, no immediate complications Patient was prepped and draped in the usual sterile fashion.       Clinical Data: No additional findings.   Subjective: Chief Complaint  Patient presents with  . Left Knee - Pain  . Right Knee - Pain    Patient is an 84 year old female who comes in with bilateral knee pain for several years.  She denies any numbness and tingling.  She has had a previous knee scope with debridement.  She had  previous cortisone injections with relief.    Review of Systems  Constitutional: Negative.   HENT: Negative.   Eyes: Negative.   Respiratory: Negative.   Cardiovascular: Negative.   Endocrine: Negative.   Musculoskeletal: Negative.   Neurological: Negative.   Hematological: Negative.   Psychiatric/Behavioral: Negative.   All other systems reviewed and are negative.    Objective: Vital Signs: There were no vitals taken for this visit.  Physical Exam  Constitutional: She is oriented to person, place, and time. She appears well-developed and well-nourished.  HENT:  Head: Normocephalic and atraumatic.  Eyes: EOM are normal.  Neck: Neck supple.  Pulmonary/Chest: Effort normal.  Abdominal: Soft.  Neurological: She is alert and oriented to person, place, and time.  Skin: Skin is warm. Capillary refill takes less than 2 seconds.  Psychiatric: She has a normal mood and affect. Her behavior is normal. Judgment and thought content normal.  Nursing note and vitals reviewed.   Ortho Exam Bilateral knee exam shows no joint effusion.  Mild valgus alignment.  Range of motion preserved. Specialty Comments:  No specialty comments available.  Imaging: Xr Knee 1-2 Views Left  Result Date: 03/18/2017 Significant degenerative joint disease with lateral compartment disease bein most significantg  Xr Knee 1-2 Views Right  Result Date: 03/18/2017 Degenerative joint disease worst in the lateral compartment    PMFS History: Patient Active Problem List   Diagnosis Date Noted  . Moderate protein-calorie malnutrition ([place]) 11/28/2014  . Bigeminy 11/23/2014  . Trigeminy 11/23/2014  . Essential hypertension 11/21/2014  . Hypomagnesemia 11/18/2014  . Normocytic anemia 11/18/2014  . Hypokalemia 11/17/2014  . Leukocytosis 11/17/2014  . Thrombocytopenia ([place]) 11/17/2014  . Nausea and vomiting 11/16/2014  . Dyslipidemia 11/16/2014  . SBO (small bowel obstruction) ([place]) 11/15/2014  . CAD  (coronary artery disease), native coronary artery 11/15/2014   Past Medical History:  Diagnosis Date  . Arthritis   . GERD (gastroesophageal reflux disease)   . Heart disease   . Hiatal hernia   . Hypercholesterolemia   . Hypertension   . IBS (irritable bowel syndrome)     Family History  Problem Relation Age of Onset  . Diabetes Mother   . Hypertension Mother   . Heart attack Mother   . Heart attack Father   . Diabetes Sister   . Hypertension Brother   . Heart attack Brother   . Heart attack Sister   . Stroke Sister     Past Surgical History:  Procedure Laterality Date  . ABDOMINAL HYSTERECTOMY  1986  . APPENDECTOMY  1973  . CARDIAC CATHETERIZATION    . CATARACT EXTRACTION  2006  . CHOLECYSTECTOMY  1973  . EYE SURGERY    . LAPAROSCOPIC LYSIS OF ADHESIONS  11/23/2014   Procedure: LAPAROSCOPIC LYSIS OF ADHESIONS;  Surgeon: Leighton Ruff, MD;  Location: [facility];  Service: General;;  . LAPAROSCOPY N/A 11/23/2014   Procedure: DIAGNOSTIC LAPAROSCOPY;  Surgeon: Leighton Ruff, MD;  Location: [facility];  Service: General;  Laterality: N/A;  . Meniscus Tear  2009  . ULNAR NERVE REPAIR Left 2013   Social History   Occupational History  . Not on file  Tobacco Use  . Smoking status: Former Smoker    Packs/day: 1.00    Years: 2.00    Pack years: 2.00    Types: Cigarettes    Last attempt to quit: 10/27/1989    Years since quitting: 27.4  . Smokeless tobacco: Never Used  Substance and Sexual Activity  . Alcohol use: Yes    Alcohol/week: 0.0 oz    Comment: Occasional - 1 glass of wine per week  . Drug use: No  . Sexual activity: Not on file

## 2017-03-19 ENCOUNTER — Ambulatory Visit (HOSPITAL_COMMUNITY)
Admission: RE | Admit: 2017-03-19 | Discharge: 2017-03-19 | Disposition: A | Discharge status: Home / Self Care | Payer: Medicare Other | Source: Ambulatory Visit | Attending: Internal Medicine | Admitting: Internal Medicine

## 2017-03-19 DIAGNOSIS — M810 Age-related osteoporosis without current pathological fracture: Secondary | ICD-10-CM | POA: Insufficient documentation

## 2017-03-19 MED ORDER — ZOLEDRONIC ACID 5 MG/100ML IV SOLN
INTRAVENOUS | Status: AC
  Filled 2017-03-19: qty 100

## 2017-03-19 MED ORDER — ZOLEDRONIC ACID 5 MG/100ML IV SOLN
5.0000 mg | Freq: Once | INTRAVENOUS | Status: AC
  Administered 2017-03-19: 11:00:00 5 mg via INTRAVENOUS

## 2017-03-19 NOTE — Discharge Instructions (Signed)

## 2017-06-10 DIAGNOSIS — H401112 Primary open-angle glaucoma, right eye, moderate stage: Secondary | ICD-10-CM | POA: Diagnosis not present

## 2017-06-10 DIAGNOSIS — H401121 Primary open-angle glaucoma, left eye, mild stage: Secondary | ICD-10-CM | POA: Diagnosis not present

## 2017-09-02 DIAGNOSIS — R109 Unspecified abdominal pain: Secondary | ICD-10-CM | POA: Diagnosis not present

## 2017-09-02 DIAGNOSIS — R1084 Generalized abdominal pain: Secondary | ICD-10-CM | POA: Diagnosis not present

## 2017-09-02 DIAGNOSIS — Z6827 Body mass index (BMI) 27.0-27.9, adult: Secondary | ICD-10-CM | POA: Diagnosis not present

## 2017-09-02 DIAGNOSIS — K5909 Other constipation: Secondary | ICD-10-CM | POA: Diagnosis not present

## 2017-10-25 DIAGNOSIS — H401113 Primary open-angle glaucoma, right eye, severe stage: Secondary | ICD-10-CM | POA: Diagnosis not present

## 2017-10-25 DIAGNOSIS — H401122 Primary open-angle glaucoma, left eye, moderate stage: Secondary | ICD-10-CM | POA: Diagnosis not present

## 2017-11-21 DIAGNOSIS — H401122 Primary open-angle glaucoma, left eye, moderate stage: Secondary | ICD-10-CM | POA: Diagnosis not present

## 2017-11-21 DIAGNOSIS — H401113 Primary open-angle glaucoma, right eye, severe stage: Secondary | ICD-10-CM | POA: Diagnosis not present

## 2018-03-03 DIAGNOSIS — H401113 Primary open-angle glaucoma, right eye, severe stage: Secondary | ICD-10-CM | POA: Diagnosis not present

## 2018-03-03 DIAGNOSIS — H401122 Primary open-angle glaucoma, left eye, moderate stage: Secondary | ICD-10-CM | POA: Diagnosis not present

## 2018-03-05 DIAGNOSIS — E559 Vitamin D deficiency, unspecified: Secondary | ICD-10-CM | POA: Diagnosis not present

## 2018-03-05 DIAGNOSIS — E7849 Other hyperlipidemia: Secondary | ICD-10-CM | POA: Diagnosis not present

## 2018-03-05 DIAGNOSIS — I10 Essential (primary) hypertension: Secondary | ICD-10-CM | POA: Diagnosis not present

## 2018-03-05 DIAGNOSIS — R82998 Other abnormal findings in urine: Secondary | ICD-10-CM | POA: Diagnosis not present

## 2018-03-05 DIAGNOSIS — M859 Disorder of bone density and structure, unspecified: Secondary | ICD-10-CM | POA: Diagnosis not present

## 2018-03-12 DIAGNOSIS — M859 Disorder of bone density and structure, unspecified: Secondary | ICD-10-CM | POA: Diagnosis not present

## 2018-03-12 DIAGNOSIS — Z0000 Encounter for general adult medical examination without abnormal findings: Secondary | ICD-10-CM | POA: Diagnosis not present

## 2018-03-12 DIAGNOSIS — Z6826 Body mass index (BMI) 26.0-26.9, adult: Secondary | ICD-10-CM | POA: Diagnosis not present

## 2018-03-12 DIAGNOSIS — F0390 Unspecified dementia without behavioral disturbance: Secondary | ICD-10-CM | POA: Diagnosis not present

## 2018-03-12 DIAGNOSIS — E559 Vitamin D deficiency, unspecified: Secondary | ICD-10-CM | POA: Diagnosis not present

## 2018-03-12 DIAGNOSIS — I25118 Atherosclerotic heart disease of native coronary artery with other forms of angina pectoris: Secondary | ICD-10-CM | POA: Diagnosis not present

## 2018-03-12 DIAGNOSIS — Z1389 Encounter for screening for other disorder: Secondary | ICD-10-CM | POA: Diagnosis not present

## 2018-03-12 DIAGNOSIS — E7849 Other hyperlipidemia: Secondary | ICD-10-CM | POA: Diagnosis not present

## 2018-03-12 DIAGNOSIS — F3340 Major depressive disorder, recurrent, in remission, unspecified: Secondary | ICD-10-CM | POA: Diagnosis not present

## 2018-03-12 DIAGNOSIS — K219 Gastro-esophageal reflux disease without esophagitis: Secondary | ICD-10-CM | POA: Diagnosis not present

## 2018-03-12 DIAGNOSIS — H4089 Other specified glaucoma: Secondary | ICD-10-CM | POA: Diagnosis not present

## 2018-03-12 DIAGNOSIS — I10 Essential (primary) hypertension: Secondary | ICD-10-CM | POA: Diagnosis not present

## 2018-03-20 ENCOUNTER — Other Ambulatory Visit (HOSPITAL_COMMUNITY): Payer: Self-pay | Admitting: *Deleted

## 2018-03-21 ENCOUNTER — Ambulatory Visit (HOSPITAL_COMMUNITY)
Admission: RE | Admit: 2018-03-21 | Discharge: 2018-03-21 | Disposition: A | Discharge status: Home / Self Care | Payer: Medicare Other | Source: Ambulatory Visit | Attending: Internal Medicine | Admitting: Internal Medicine

## 2018-03-21 DIAGNOSIS — M810 Age-related osteoporosis without current pathological fracture: Secondary | ICD-10-CM | POA: Insufficient documentation

## 2018-03-21 MED ORDER — ZOLEDRONIC ACID 5 MG/100ML IV SOLN: 5.0000 mg | Freq: Once | INTRAVENOUS | Status: DC

## 2018-03-21 MED ORDER — ZOLEDRONIC ACID 5 MG/100ML IV SOLN
INTRAVENOUS | Status: AC
  Administered 2018-03-21: 5 mg
  Filled 2018-03-21: qty 100

## 2018-04-15 DIAGNOSIS — Z9114 Patient's other noncompliance with medication regimen: Secondary | ICD-10-CM | POA: Diagnosis not present

## 2018-04-15 DIAGNOSIS — R488 Other symbolic dysfunctions: Secondary | ICD-10-CM | POA: Diagnosis not present

## 2018-04-18 DIAGNOSIS — R488 Other symbolic dysfunctions: Secondary | ICD-10-CM | POA: Diagnosis not present

## 2018-04-18 DIAGNOSIS — Z9114 Patient's other noncompliance with medication regimen: Secondary | ICD-10-CM | POA: Diagnosis not present

## 2018-04-24 DIAGNOSIS — R488 Other symbolic dysfunctions: Secondary | ICD-10-CM | POA: Diagnosis not present

## 2018-04-24 DIAGNOSIS — Z9114 Patient's other noncompliance with medication regimen: Secondary | ICD-10-CM | POA: Diagnosis not present

## 2018-04-25 DIAGNOSIS — Z9114 Patient's other noncompliance with medication regimen: Secondary | ICD-10-CM | POA: Diagnosis not present

## 2018-04-25 DIAGNOSIS — R488 Other symbolic dysfunctions: Secondary | ICD-10-CM | POA: Diagnosis not present

## 2018-04-29 DIAGNOSIS — R488 Other symbolic dysfunctions: Secondary | ICD-10-CM | POA: Diagnosis not present

## 2018-04-30 DIAGNOSIS — R488 Other symbolic dysfunctions: Secondary | ICD-10-CM | POA: Diagnosis not present

## 2018-05-01 DIAGNOSIS — R488 Other symbolic dysfunctions: Secondary | ICD-10-CM | POA: Diagnosis not present

## 2018-05-07 DIAGNOSIS — R488 Other symbolic dysfunctions: Secondary | ICD-10-CM | POA: Diagnosis not present

## 2018-05-08 DIAGNOSIS — R488 Other symbolic dysfunctions: Secondary | ICD-10-CM | POA: Diagnosis not present

## 2018-05-12 DIAGNOSIS — R488 Other symbolic dysfunctions: Secondary | ICD-10-CM | POA: Diagnosis not present

## 2018-05-14 DIAGNOSIS — R488 Other symbolic dysfunctions: Secondary | ICD-10-CM | POA: Diagnosis not present

## 2018-09-12 DIAGNOSIS — M6281 Muscle weakness (generalized): Secondary | ICD-10-CM | POA: Diagnosis not present

## 2018-09-12 DIAGNOSIS — R2689 Other abnormalities of gait and mobility: Secondary | ICD-10-CM | POA: Diagnosis not present

## 2018-09-13 ENCOUNTER — Other Ambulatory Visit (INDEPENDENT_AMBULATORY_CARE_PROVIDER_SITE_OTHER): Payer: Self-pay | Admitting: Orthopaedic Surgery

## 2018-09-15 DIAGNOSIS — R2689 Other abnormalities of gait and mobility: Secondary | ICD-10-CM | POA: Diagnosis not present

## 2018-09-15 DIAGNOSIS — M6281 Muscle weakness (generalized): Secondary | ICD-10-CM | POA: Diagnosis not present

## 2018-09-15 NOTE — Telephone Encounter (Signed)
sure

## 2018-09-15 NOTE — Telephone Encounter (Signed)
Ok to rf? 

## 2018-09-16 DIAGNOSIS — M6281 Muscle weakness (generalized): Secondary | ICD-10-CM | POA: Diagnosis not present

## 2018-09-16 DIAGNOSIS — R2689 Other abnormalities of gait and mobility: Secondary | ICD-10-CM | POA: Diagnosis not present

## 2018-09-17 DIAGNOSIS — G4700 Insomnia, unspecified: Secondary | ICD-10-CM | POA: Diagnosis not present

## 2018-09-17 DIAGNOSIS — I10 Essential (primary) hypertension: Secondary | ICD-10-CM | POA: Diagnosis not present

## 2018-09-17 DIAGNOSIS — M25512 Pain in left shoulder: Secondary | ICD-10-CM | POA: Diagnosis not present

## 2018-09-17 DIAGNOSIS — R630 Anorexia: Secondary | ICD-10-CM | POA: Diagnosis not present

## 2018-09-17 DIAGNOSIS — M6281 Muscle weakness (generalized): Secondary | ICD-10-CM | POA: Diagnosis not present

## 2018-09-17 DIAGNOSIS — W19XXXA Unspecified fall, initial encounter: Secondary | ICD-10-CM | POA: Diagnosis not present

## 2018-09-17 DIAGNOSIS — D692 Other nonthrombocytopenic purpura: Secondary | ICD-10-CM | POA: Diagnosis not present

## 2018-09-17 DIAGNOSIS — F0390 Unspecified dementia without behavioral disturbance: Secondary | ICD-10-CM | POA: Diagnosis not present

## 2018-09-17 DIAGNOSIS — F3340 Major depressive disorder, recurrent, in remission, unspecified: Secondary | ICD-10-CM | POA: Diagnosis not present

## 2018-09-17 DIAGNOSIS — R2689 Other abnormalities of gait and mobility: Secondary | ICD-10-CM | POA: Diagnosis not present

## 2018-09-19 DIAGNOSIS — R2689 Other abnormalities of gait and mobility: Secondary | ICD-10-CM | POA: Diagnosis not present

## 2018-09-19 DIAGNOSIS — M6281 Muscle weakness (generalized): Secondary | ICD-10-CM | POA: Diagnosis not present

## 2018-09-23 DIAGNOSIS — R2689 Other abnormalities of gait and mobility: Secondary | ICD-10-CM | POA: Diagnosis not present

## 2018-09-23 DIAGNOSIS — M6281 Muscle weakness (generalized): Secondary | ICD-10-CM | POA: Diagnosis not present

## 2018-09-24 DIAGNOSIS — R2689 Other abnormalities of gait and mobility: Secondary | ICD-10-CM | POA: Diagnosis not present

## 2018-09-24 DIAGNOSIS — M6281 Muscle weakness (generalized): Secondary | ICD-10-CM | POA: Diagnosis not present

## 2018-09-26 DIAGNOSIS — M6281 Muscle weakness (generalized): Secondary | ICD-10-CM | POA: Diagnosis not present

## 2018-09-26 DIAGNOSIS — R2689 Other abnormalities of gait and mobility: Secondary | ICD-10-CM | POA: Diagnosis not present

## 2018-09-30 DIAGNOSIS — M6281 Muscle weakness (generalized): Secondary | ICD-10-CM | POA: Diagnosis not present

## 2018-10-01 DIAGNOSIS — M6281 Muscle weakness (generalized): Secondary | ICD-10-CM | POA: Diagnosis not present

## 2018-10-02 DIAGNOSIS — M6281 Muscle weakness (generalized): Secondary | ICD-10-CM | POA: Diagnosis not present

## 2018-10-06 DIAGNOSIS — M6281 Muscle weakness (generalized): Secondary | ICD-10-CM | POA: Diagnosis not present

## 2018-10-16 DIAGNOSIS — M6281 Muscle weakness (generalized): Secondary | ICD-10-CM | POA: Diagnosis not present

## 2018-10-21 DIAGNOSIS — M6281 Muscle weakness (generalized): Secondary | ICD-10-CM | POA: Diagnosis not present

## 2018-10-22 DIAGNOSIS — H401122 Primary open-angle glaucoma, left eye, moderate stage: Secondary | ICD-10-CM | POA: Diagnosis not present

## 2018-10-22 DIAGNOSIS — H401113 Primary open-angle glaucoma, right eye, severe stage: Secondary | ICD-10-CM | POA: Diagnosis not present

## 2018-10-27 DIAGNOSIS — H401122 Primary open-angle glaucoma, left eye, moderate stage: Secondary | ICD-10-CM | POA: Diagnosis not present

## 2018-10-27 DIAGNOSIS — H401113 Primary open-angle glaucoma, right eye, severe stage: Secondary | ICD-10-CM | POA: Diagnosis not present

## 2018-10-28 DIAGNOSIS — M6281 Muscle weakness (generalized): Secondary | ICD-10-CM | POA: Diagnosis not present

## 2018-11-05 DIAGNOSIS — M6281 Muscle weakness (generalized): Secondary | ICD-10-CM | POA: Diagnosis not present

## 2018-12-16 DIAGNOSIS — I10 Essential (primary) hypertension: Secondary | ICD-10-CM | POA: Diagnosis not present

## 2018-12-16 DIAGNOSIS — R630 Anorexia: Secondary | ICD-10-CM | POA: Diagnosis not present

## 2018-12-16 DIAGNOSIS — F0390 Unspecified dementia without behavioral disturbance: Secondary | ICD-10-CM | POA: Diagnosis not present

## 2018-12-16 DIAGNOSIS — K5900 Constipation, unspecified: Secondary | ICD-10-CM | POA: Diagnosis not present

## 2018-12-16 DIAGNOSIS — F3340 Major depressive disorder, recurrent, in remission, unspecified: Secondary | ICD-10-CM | POA: Diagnosis not present

## 2018-12-16 DIAGNOSIS — G4700 Insomnia, unspecified: Secondary | ICD-10-CM | POA: Diagnosis not present

## 2019-03-16 DIAGNOSIS — Z23 Encounter for immunization: Secondary | ICD-10-CM | POA: Diagnosis not present

## 2019-03-20 DIAGNOSIS — E7849 Other hyperlipidemia: Secondary | ICD-10-CM | POA: Diagnosis not present

## 2019-03-20 DIAGNOSIS — M859 Disorder of bone density and structure, unspecified: Secondary | ICD-10-CM | POA: Diagnosis not present

## 2019-03-27 DIAGNOSIS — D692 Other nonthrombocytopenic purpura: Secondary | ICD-10-CM | POA: Diagnosis not present

## 2019-03-27 DIAGNOSIS — E785 Hyperlipidemia, unspecified: Secondary | ICD-10-CM | POA: Diagnosis not present

## 2019-03-27 DIAGNOSIS — I25118 Atherosclerotic heart disease of native coronary artery with other forms of angina pectoris: Secondary | ICD-10-CM | POA: Diagnosis not present

## 2019-03-27 DIAGNOSIS — I10 Essential (primary) hypertension: Secondary | ICD-10-CM | POA: Diagnosis not present

## 2019-03-27 DIAGNOSIS — F3340 Major depressive disorder, recurrent, in remission, unspecified: Secondary | ICD-10-CM | POA: Diagnosis not present

## 2019-03-27 DIAGNOSIS — Z1331 Encounter for screening for depression: Secondary | ICD-10-CM | POA: Diagnosis not present

## 2019-03-27 DIAGNOSIS — F0390 Unspecified dementia without behavioral disturbance: Secondary | ICD-10-CM | POA: Diagnosis not present

## 2019-03-27 DIAGNOSIS — E559 Vitamin D deficiency, unspecified: Secondary | ICD-10-CM | POA: Diagnosis not present

## 2019-03-27 DIAGNOSIS — K219 Gastro-esophageal reflux disease without esophagitis: Secondary | ICD-10-CM | POA: Diagnosis not present

## 2019-03-27 DIAGNOSIS — Z0000 Encounter for general adult medical examination without abnormal findings: Secondary | ICD-10-CM | POA: Diagnosis not present

## 2019-03-27 DIAGNOSIS — M8580 Other specified disorders of bone density and structure, unspecified site: Secondary | ICD-10-CM | POA: Diagnosis not present

## 2019-04-13 DIAGNOSIS — Z23 Encounter for immunization: Secondary | ICD-10-CM | POA: Diagnosis not present

## 2019-04-29 ENCOUNTER — Other Ambulatory Visit: Payer: Self-pay

## 2019-04-29 ENCOUNTER — Observation Stay (HOSPITAL_COMMUNITY)
Admission: EM | Admit: 2019-04-29 | Discharge: 2019-04-30 | Disposition: A | Discharge status: Home / Self Care | Payer: Medicare Other | Attending: Family Medicine | Admitting: Family Medicine

## 2019-04-29 ENCOUNTER — Encounter (HOSPITAL_COMMUNITY): Payer: Self-pay | Admitting: Emergency Medicine

## 2019-04-29 ENCOUNTER — Emergency Department (HOSPITAL_COMMUNITY): Payer: Medicare Other

## 2019-04-29 DIAGNOSIS — I10 Essential (primary) hypertension: Secondary | ICD-10-CM

## 2019-04-29 DIAGNOSIS — E785 Hyperlipidemia, unspecified: Secondary | ICD-10-CM | POA: Diagnosis not present

## 2019-04-29 DIAGNOSIS — R54 Age-related physical debility: Secondary | ICD-10-CM | POA: Diagnosis not present

## 2019-04-29 DIAGNOSIS — K219 Gastro-esophageal reflux disease without esophagitis: Secondary | ICD-10-CM | POA: Insufficient documentation

## 2019-04-29 DIAGNOSIS — E876 Hypokalemia: Secondary | ICD-10-CM | POA: Diagnosis not present

## 2019-04-29 DIAGNOSIS — R42 Dizziness and giddiness: Secondary | ICD-10-CM | POA: Diagnosis not present

## 2019-04-29 DIAGNOSIS — R110 Nausea: Secondary | ICD-10-CM | POA: Diagnosis not present

## 2019-04-29 DIAGNOSIS — I728 Aneurysm of other specified arteries: Secondary | ICD-10-CM | POA: Diagnosis not present

## 2019-04-29 DIAGNOSIS — K449 Diaphragmatic hernia without obstruction or gangrene: Secondary | ICD-10-CM | POA: Diagnosis not present

## 2019-04-29 DIAGNOSIS — Z87891 Personal history of nicotine dependence: Secondary | ICD-10-CM | POA: Diagnosis not present

## 2019-04-29 DIAGNOSIS — Z20822 Contact with and (suspected) exposure to covid-19: Secondary | ICD-10-CM | POA: Diagnosis not present

## 2019-04-29 DIAGNOSIS — Z79899 Other long term (current) drug therapy: Secondary | ICD-10-CM | POA: Insufficient documentation

## 2019-04-29 DIAGNOSIS — R112 Nausea with vomiting, unspecified: Secondary | ICD-10-CM | POA: Diagnosis not present

## 2019-04-29 DIAGNOSIS — I700 Atherosclerosis of aorta: Secondary | ICD-10-CM | POA: Diagnosis not present

## 2019-04-29 DIAGNOSIS — Z7982 Long term (current) use of aspirin: Secondary | ICD-10-CM | POA: Insufficient documentation

## 2019-04-29 DIAGNOSIS — D72829 Elevated white blood cell count, unspecified: Secondary | ICD-10-CM | POA: Diagnosis not present

## 2019-04-29 DIAGNOSIS — R1111 Vomiting without nausea: Secondary | ICD-10-CM | POA: Diagnosis not present

## 2019-04-29 DIAGNOSIS — E7800 Pure hypercholesterolemia, unspecified: Secondary | ICD-10-CM | POA: Insufficient documentation

## 2019-04-29 DIAGNOSIS — I2510 Atherosclerotic heart disease of native coronary artery without angina pectoris: Secondary | ICD-10-CM | POA: Insufficient documentation

## 2019-04-29 DIAGNOSIS — K5730 Diverticulosis of large intestine without perforation or abscess without bleeding: Secondary | ICD-10-CM | POA: Insufficient documentation

## 2019-04-29 DIAGNOSIS — E860 Dehydration: Secondary | ICD-10-CM | POA: Insufficient documentation

## 2019-04-29 DIAGNOSIS — R1110 Vomiting, unspecified: Secondary | ICD-10-CM | POA: Diagnosis not present

## 2019-04-29 LAB — CBC WITH DIFFERENTIAL/PLATELET
Abs Immature Granulocytes: 0.05 10*3/uL (ref 0.00–0.07)
Basophils Absolute: 0 10*3/uL (ref 0.0–0.1)
Basophils Relative: 0 %
Eosinophils Absolute: 0 10*3/uL (ref 0.0–0.5)
Eosinophils Relative: 0 %
HCT: 38.7 % (ref 36.0–46.0)
Hemoglobin: 12.4 g/dL (ref 12.0–15.0)
Immature Granulocytes: 0 %
Lymphocytes Relative: 17 %
Lymphs Abs: 2.4 10*3/uL (ref 0.7–4.0)
MCH: 30.6 pg (ref 26.0–34.0)
MCHC: 32 g/dL (ref 30.0–36.0)
MCV: 95.6 fL (ref 80.0–100.0)
Monocytes Absolute: 1.1 10*3/uL — ABNORMAL HIGH (ref 0.1–1.0)
Monocytes Relative: 8 %
Neutro Abs: 10.5 10*3/uL — ABNORMAL HIGH (ref 1.7–7.7)
Neutrophils Relative %: 75 %
Platelets: 218 10*3/uL (ref 150–400)
RBC: 4.05 MIL/uL (ref 3.87–5.11)
RDW: 14.4 % (ref 11.5–15.5)
WBC: 14 10*3/uL — ABNORMAL HIGH (ref 4.0–10.5)
nRBC: 0 % (ref 0.0–0.2)

## 2019-04-29 LAB — URINALYSIS, ROUTINE W REFLEX MICROSCOPIC
Bilirubin Urine: NEGATIVE
Glucose, UA: NEGATIVE mg/dL
Hgb urine dipstick: NEGATIVE
Ketones, ur: 20 mg/dL — AB
Nitrite: NEGATIVE
Protein, ur: NEGATIVE mg/dL
Specific Gravity, Urine: 1.028 (ref 1.005–1.030)
pH: 6 (ref 5.0–8.0)

## 2019-04-29 LAB — COMPREHENSIVE METABOLIC PANEL
ALT: 23 U/L (ref 0–44)
AST: 33 U/L (ref 15–41)
Albumin: 3.7 g/dL (ref 3.5–5.0)
Alkaline Phosphatase: 51 U/L (ref 38–126)
Anion gap: 11 (ref 5–15)
BUN: 15 mg/dL (ref 8–23)
CO2: 24 mmol/L (ref 22–32)
Calcium: 8.5 mg/dL — ABNORMAL LOW (ref 8.9–10.3)
Chloride: 102 mmol/L (ref 98–111)
Creatinine, Ser: 0.8 mg/dL (ref 0.44–1.00)
GFR calc Af Amer: >60 mL/min (ref >60)
GFR calc non Af Amer: >60 mL/min (ref >60)
Glucose, Bld: 132 mg/dL — ABNORMAL HIGH (ref 70–99)
Potassium: 2.6 mmol/L — CL (ref 3.5–5.1)
Sodium: 137 mmol/L (ref 135–145)
Total Bilirubin: 0.8 mg/dL (ref 0.3–1.2)
Total Protein: 6.5 g/dL (ref 6.5–8.1)

## 2019-04-29 LAB — GROUP A STREP BY PCR: Group A Strep by PCR: NOT DETECTED

## 2019-04-29 LAB — MAGNESIUM: Magnesium: 2 mg/dL (ref 1.7–2.4)

## 2019-04-29 LAB — LIPASE, BLOOD: Lipase: 41 U/L (ref 11–51)

## 2019-04-29 MED ORDER — POTASSIUM CHLORIDE 10 MEQ/100ML IV SOLN
10.0000 meq | INTRAVENOUS | Status: AC
  Administered 2019-04-29 (×2): 10 meq via INTRAVENOUS
  Filled 2019-04-29 (×2): qty 100

## 2019-04-29 MED ORDER — POTASSIUM CHLORIDE CRYS ER 20 MEQ PO TBCR
40.0000 meq | EXTENDED_RELEASE_TABLET | Freq: Once | ORAL | Status: AC
  Administered 2019-04-29: 40 meq via ORAL
  Filled 2019-04-29: qty 2

## 2019-04-29 MED ORDER — IOHEXOL 300 MG/ML  SOLN
100.0000 mL | Freq: Once | INTRAMUSCULAR | Status: AC | PRN
  Administered 2019-04-29: 21:00:00 100 mL via INTRAVENOUS

## 2019-04-29 MED ORDER — SODIUM CHLORIDE 0.9 % IV BOLUS
500.0000 mL | Freq: Once | INTRAVENOUS | Status: AC
  Administered 2019-04-29: 22:00:00 500 mL via INTRAVENOUS

## 2019-04-29 MED ORDER — SODIUM CHLORIDE (PF) 0.9 % IJ SOLN
INTRAMUSCULAR | Status: AC
  Filled 2019-04-29: qty 50

## 2019-04-29 MED ORDER — ONDANSETRON HCL 4 MG/2ML IJ SOLN
4.0000 mg | Freq: Once | INTRAMUSCULAR | Status: AC
  Administered 2019-04-29: 22:00:00 4 mg via INTRAVENOUS
  Filled 2019-04-29: qty 2

## 2019-04-29 NOTE — ED Notes (Signed)
Pt asked for a urine sample. Pt states that she is unable to provide a sample at this time

## 2019-04-29 NOTE — ED Notes (Signed)
Pt given water and apple juice. Pt able to drink with no nausea.

## 2019-04-29 NOTE — H&P (Signed)
History and Physical    PLEASE NOTE THAT DRAGON DICTATION SOFTWARE WAS USED IN THE CONSTRUCTION OF THIS NOTE.   Leslie Frey [id number] DOB: Apr 07, 1932 DOA: 04/29/2019  PCP: Velna Hatchet, MD Patient coming from: SNF  I have personally briefly reviewed patient's old medical records in [place]  Chief Complaint: Nausea and vomiting  HPI: Leslie Frey is a 86 y.o. female with medical history significant for hypertension, hyperlipidemia, GERD, small bowel obstruction, who is admitted to [facility] on 04/29/2019 with hypokalemia after presenting from SNF to [facility] Emergency Department complaining of nausea/vomiting.   The patient reports 1 day of nausea resulting in 4 episodes of nonbloody, nonbilious emesis over that time.  She reports associated mild epigastric discomfort, which started after the first 2 episodes of vomiting.  She describes her abdominal discomfort as sharp and nonradiating, with exacerbation during the act of vomiting.  Denies any recent trauma or travel.  Denies any associated diarrhea, melena, hematochezia, or rash.  Denies any associated subjective fever, chills, rigors, or generalized myalgias. Denies any recent headache, neck stiffness, rhinitis, rhinorrhea, sore throat, sob, cough. No known COVID-19 exposures. Denies dysuria, gross hematuria, or change in urinary urgency/frequency.   Denies any associated chest pain, palpitations, diaphoresis, dizziness, presyncope, or syncope.    ED Course:  Vital signs in the ED were notable for the following: Temperature max 97.6; heart rate 71-84; blood pressure 103/72-120 3/77; respiratory rate 17-18; and oxygen saturation 97 to 98% on room air.  Labs were notable for the following: CMP notable for the following: Sodium 137, potassium 2.6, bicarbonate 24, creatinine 0.80, and liver enzymes were found to be within normal limits.  Serum magnesium found to be 2.0.  Lipase 41.  CBC notable for white  blood cell count of 14,000 with 75% neutrophils.  Urinalysis notable for 6-10 white blood cells, rare bacteria, and was noted to be nitrate negative.  Screening nasopharyngeal COVID-19 PCR was obtained in the ED this evening, with result currently pending.  Given the patient's report of abdominal discomfort, as further described above, in the context of a documented history of small bowel stricturing, CT abdomen/pelvis was obtained, and demonstrated no evidence of acute intra-abdominal process.  While in the ED, the following were administered: Normal saline x500 cc bolus, potassium chloride 40 mEq p.o. x1, potassium chloride 20 mEq IV over 2 hours x 1, and Zofran 4 mg IV x1.  Subsequently, the patient was admitted to the med telemetry floor for further evaluation and management of presenting hypokalemia in the setting of nausea/vomiting.    Review of Systems: As per HPI otherwise 10 point review of systems negative.   Past Medical History:  Diagnosis Date  . Arthritis   . GERD (gastroesophageal reflux disease)   . Heart disease   . Hiatal hernia   . Hypercholesterolemia   . Hypertension   . IBS (irritable bowel syndrome)     Past Surgical History:  Procedure Laterality Date  . ABDOMINAL HYSTERECTOMY  1986  . APPENDECTOMY  1973  . CARDIAC CATHETERIZATION    . CATARACT EXTRACTION  2006  . CHOLECYSTECTOMY  1973  . EYE SURGERY    . LAPAROSCOPIC LYSIS OF ADHESIONS  11/23/2014   Procedure: LAPAROSCOPIC LYSIS OF ADHESIONS;  Surgeon: Leighton Ruff, MD;  Location: [facility] ORS;  Service: General;;  . LAPAROSCOPY N/A 11/23/2014   Procedure: DIAGNOSTIC LAPAROSCOPY;  Surgeon: Leighton Ruff, MD;  Location: [facility] ORS;  Service: General;  Laterality: N/A;  . Meniscus Tear  2009  . ULNAR NERVE REPAIR Left 2013    Social History:  reports that she quit smoking about 29 years ago. Her smoking use included cigarettes. She has a 2.00 pack-year smoking history. She has never used smokeless tobacco. She  reports current alcohol use. She reports that she does not use drugs.   No Known Allergies  Family History  Problem Relation Age of Onset  . Diabetes Mother   . Hypertension Mother   . Heart attack Mother   . Heart attack Father   . Diabetes Sister   . Hypertension Brother   . Heart attack Brother   . Heart attack Sister   . Stroke Sister      Prior to Admission medications   Medication Sig Start Date End Date Taking? Authorizing Provider  AMITIZA 24 MCG capsule Take 1 capsule by mouth 2 (two) times daily. 09/07/14  Yes [provider]  aspirin 81 MG chewable tablet Chew 81 mg by mouth daily.   Yes [provider]  bumetanide (BUMEX) 1 MG tablet Take 1 tablet by mouth daily. 08/31/14  Yes [provider]  carvedilol (COREG) 3.125 MG tablet Take 3.125 mg by mouth 2 (two) times daily. 08/31/14  Yes [provider]  donepezil (ARICEPT) 5 MG tablet Take 5 mg by mouth daily.  04/08/16  Yes [provider]  dorzolamidel-timolol (COSOPT) 22.3-6.8 MG/ML SOLN ophthalmic solution 1 drop 2 (two) times daily.   Yes [provider]  isosorbide mononitrate (IMDUR) 60 MG 24 hr tablet Take 60 mg by mouth daily.    Yes [provider]  lisinopril (PRINIVIL,ZESTRIL) 5 MG tablet Take 5 mg by mouth daily. 08/31/14  Yes [provider]  mirtazapine (REMERON) 15 MG tablet Take 15 mg by mouth at bedtime as needed (sleep).    Yes [provider]  Multiple Vitamin (MULTIVITAMIN) tablet Take 1 tablet by mouth daily.   Yes [provider]  nitroGLYCERIN (NITROSTAT) 0.4 MG SL tablet Place 0.4 mg under the tongue every 5 (five) minutes as needed for chest pain (3 doses max).    Yes [provider]  omeprazole (PRILOSEC) 40 MG capsule Take 40 mg by mouth 2 (two) times daily. 10/15/14  Yes [provider]  simvastatin (ZOCOR) 40 MG tablet Take 40 mg by mouth every evening. 09/27/14  Yes [provider]    TRAVATAN Z 0.004 % SOLN ophthalmic solution Place 1 drop into both eyes at bedtime.  04/07/16  Yes [provider]  vitamin E 100 UNIT capsule Take by mouth daily.   Yes [provider]  diclofenac sodium (VOLTAREN) 1 % GEL APPLY 2 GRAMS TOPICALLY 4 TIMES A DAY Patient not taking: Reported on 04/29/2019 09/15/18   Aundra Dubin, PA-C  meloxicam (MOBIC) 7.5 MG tablet Take 1 tablet (7.5 mg total) by mouth 2 (two) times daily as needed for pain. Patient not taking: Reported on 04/29/2019 03/18/17   Leandrew Koyanagi, MD     Objective    Physical Exam: Vitals:   04/29/19 1905 04/29/19 1911 04/29/19 2030 04/29/19 2200  BP: 123/77  106/63 (!) 111/59  Pulse: 79  71 98  Resp: 18  17 18   Temp: 97.6 F (36.4 C)     SpO2: 98%  97% 97%  Weight:  65.8 kg    Height:  5\' 1"  (1.549 m)      General: appears to be stated age; alert, oriented Skin: warm, dry, no rash Head:  AT/[place] Eyes:  PEARL b/l, EOMI Mouth:  Oral mucosa membranes appear dry, normal dentition Neck: supple; trachea midline Heart:  RRR; did not appreciate any M/R/G Lungs: CTAB, did not appreciate any wheezes, rales, or rhonchi Abdomen: + BS; soft, ND, NT Vascular: 2+ pedal pulses b/l; 2+ radial pulses b/l Extremities: no peripheral edema, no muscle wasting Neuro: strength and sensation intact in upper and lower extremities b/l  Labs on Admission: I have personally reviewed following labs and imaging studies  CBC: Recent Labs  Lab 04/29/19 1956  WBC 14.0*  NEUTROABS 10.5*  HGB 12.4  HCT 38.7  MCV 95.6  PLT 99991111   Basic Metabolic Panel: Recent Labs  Lab 04/29/19 1956  NA 137  K 2.6*  CL 102  CO2 24  GLUCOSE 132*  BUN 15  CREATININE 0.80  CALCIUM 8.5*   GFR: Estimated Creatinine Clearance: 45.5 mL/min (by C-G formula based on SCr of 0.8 mg/dL). Liver Function Tests: Recent Labs  Lab 04/29/19 1956  AST 33  ALT 23  ALKPHOS 51  BILITOT 0.8  PROT 6.5  ALBUMIN 3.7   Recent Labs  Lab  04/29/19 1956  LIPASE 41   No results for input(s): AMMONIA in the last 168 hours. Coagulation Profile: No results for input(s): INR, PROTIME in the last 168 hours. Cardiac Enzymes: No results for input(s): CKTOTAL, CKMB, CKMBINDEX, TROPONINI in the last 168 hours. BNP (last 3 results) No results for input(s): PROBNP in the last 8760 hours. HbA1C: No results for input(s): HGBA1C in the last 72 hours. CBG: No results for input(s): GLUCAP in the last 168 hours. Lipid Profile: No results for input(s): CHOL, HDL, LDLCALC, TRIG, CHOLHDL, LDLDIRECT in the last 72 hours. Thyroid Function Tests: No results for input(s): TSH, T4TOTAL, FREET4, T3FREE, THYROIDAB in the last 72 hours. Anemia Panel: No results for input(s): VITAMINB12, FOLATE, FERRITIN, TIBC, IRON, RETICCTPCT in the last 72 hours. Urine analysis:    Component Value Date/Time   COLORURINE AMBER (A) 11/16/2014 1157   APPEARANCEUR CLEAR 11/16/2014 1157   LABSPEC >1.046 (H) 11/16/2014 1157   PHURINE 6.0 11/16/2014 1157   GLUCOSEU NEGATIVE 11/16/2014 1157   HGBUR NEGATIVE 11/16/2014 1157   BILIRUBINUR NEGATIVE 11/16/2014 1157   KETONESUR 15 (A) 11/16/2014 1157   PROTEINUR 30 (A) 11/16/2014 1157   UROBILINOGEN 0.2 11/16/2014 1157   NITRITE NEGATIVE 11/16/2014 1157   LEUKOCYTESUR NEGATIVE 11/16/2014 1157    Radiological Exams on Admission: CT Abdomen Pelvis W Contrast  Result Date: 04/29/2019 CLINICAL DATA:  86 year old female with nausea vomiting. EXAM: CT ABDOMEN AND PELVIS WITH CONTRAST TECHNIQUE: Multidetector CT imaging of the abdomen and pelvis was performed using the standard protocol following bolus administration of intravenous contrast. CONTRAST:  121mL OMNIPAQUE IOHEXOL 300 MG/ML  SOLN COMPARISON:  CT abdomen pelvis dated 11/15/2014. FINDINGS: Lower chest: Mild diffuse chronic interstitial coarsening. The visualized lung bases are clear. There is coronary vascular calcification and partial calcification of the mitral  annulus. No intra-abdominal free air or free fluid. Hepatobiliary: The liver is unremarkable. No intrahepatic biliary ductal dilatation. Cholecystectomy. No retained calcified stone noted in the central CBD. Pancreas: A cluster of several small cystic lesions noted in the uncinate process of the pancreas similar to prior CT. No acute inflammatory changes. No dilatation of the main pancreatic duct. Spleen: Normal in size without focal abnormality. Adrenals/Urinary Tract: The adrenal glands are unremarkable. There is no hydronephrosis on either side. There is symmetric enhancement and excretion of contrast by both kidneys. The visualized ureters and urinary bladder appear  unremarkable. Stomach/Bowel: There is a moderate size hiatal hernia. There is redundancy of the sigmoid colon with extension into the right upper abdomen likely through a mesenteric defect. No evidence of obstruction. Small scattered colonic diverticula without active inflammatory changes. There is no bowel obstruction or active inflammation. Appendectomy. Vascular/Lymphatic: Moderate aortoiliac atherosclerotic disease. The IVC is unremarkable. No portal venous gas. There is a 12 mm rim calcified splenic artery aneurysm similar to prior CT. No adenopathy. Reproductive: Hysterectomy. No adnexal masses. Other: There is swirling of the mesentery in the pelvis, likely postsurgical. Musculoskeletal: Osteopenia with multilevel degenerative changes of the spine. No acute osseous pathology. IMPRESSION: 1. No acute intra-abdominal or pelvic pathology. 2. Moderate size hiatal hernia. 3. Colonic diverticulosis. No bowel obstruction. 4. A 12 mm rim calcified splenic artery aneurysm similar to prior CT. 5. Aortic Atherosclerosis (ICD10-I70.0). Electronically Signed   By: Anner Crete M.D.   On: 04/29/2019 21:27     Assessment/Plan   Weston Mcdaniel is a 86 y.o. female with medical history significant for hypertension, hyperlipidemia, GERD, small bowel  obstruction, who is admitted to [facility] on 04/29/2019 with hypokalemia after presenting from SNF to [facility] Emergency Department complaining of nausea/vomiting.    Principal Problem:   Hypokalemia Active Problems:   Nausea and vomiting   Dyslipidemia   Leukocytosis   Essential hypertension   #) Hypokalemia: Labs performed in the ED this evening revealed serum potassium of 2.6, which appears to be as a consequence of 1 day of nausea resulting in 4 episodes of nonbloody, nonbilious emesis.  It appears that the patient also took her scheduled dose of Bumex earlier today, which may have offered a pharmacologic exacerbation in the setting of the above.  Of note, serum magnesium level found to be within normal limits at 2.0.  While in the ED this evening, the following were administered: Potassium chloride 40 equivalents p.o. x1 as well as potassium chloride 20 mEq IV over 2 hours x 1.  Plan: We will administer an additional 20 mg of IV potassium over 2 hours x 1.  Repeat BMP and serum magnesium level in the morning.  Monitor on telemetry.  We will hold home Bumex for now, with plan to reassess volume status in the morning.     #) Nausea and vomiting: 1 day of nausea resulting in 4 episodes of nonbilious, nonbloody emesis.  Etiology currently unclear.  Nasopharyngeal COVID-19 PCR result is currently pending.  In the context of a documented history of coronary artery disease, will also check an EKG, although ACS is felt to be less likely at this time.   Plan: As needed IV Zofran.  Monitor strict I's and O's.  Repeat BMP in the morning.  Check EKG, as above.     #) Leukocytosis: Presenting CBC notable for white blood cell count of 14,000 with 75% neutrophils.  At this time, I suspect mildly elevated white blood cell count to be on the basis of hemoconcentration effects of several episodes of vomiting experienced earlier today, with potential reactive component in the context  of vomiting itself as opposed to representing an underlying infectious process.  Of note, presenting urinalysis is inconsistent with an underlying urinary tract infection.  COVID-19 PCR is currently pending, and will obtain chest x-ray to further evaluate for any previously undetermined infectious source.  Plan: Chest x-ray, as above.  Monitor for result of nasopharyngeal COVID-19 PCR performed in the ED this evening.  Repeat CBC with differential in the morning.     #)  Essential hypertension: Outpatient antihypertensive medications include Coreg, lisinopril, and Imdur.  As presenting labs reflect no evidence of acute kidney injury, while blood pressure found to be normotensive, will continue home lisinopril as well as additional outpatient antihypertensive medications.  Plan: Continue home antihypertensive medications, and closely monitor ensuing blood pressures via routine vital signs.  Repeat BMP in the morning.      #) Hyperlipidemia: On simvastatin as an outpatient.  Plan: Continue statin.     #) GERD: On omeprazole as an outpatient.  Plan: We will hold home omeprazole for now to limit any potential pro-gastromotility side-effect that may be associated with this PPI.    DVT prophylaxis: Lovenox 40 mg subcu daily Code Status: Full code Family Communication: none Disposition Plan: Per Rounding Team Consults called: none  Admission status: Observation; med telemetry.    PLEASE NOTE THAT DRAGON DICTATION SOFTWARE WAS USED IN THE CONSTRUCTION OF THIS NOTE.   [facility] Pager [phone] From [place].   Otherwise, please contact night-coverage  [email or web address] Password [facility]  04/29/2019, 10:53 PM

## 2019-04-29 NOTE — ED Notes (Signed)
Pt asked if she was able to provide a urine sample at this moment. Pt states that she cannot provide a sample right now, and she will let us know when she can. We will check on her later to see if she is able to provide a sample.

## 2019-04-29 NOTE — ED Provider Notes (Signed)
[facility] [place] DEPT Provider Note   CSN: [id number] Arrival date & time: 04/29/19  1855     History Chief Complaint  Patient presents with  . Nausea  . Emesis    Leslie Frey is a 86 y.o. female.  Patient is 86 year old female with a history of GERD, hiatal hernia, hypertension, hyperlipidemia and prior small bowel obstruction who presents with nausea and vomiting.  She lives in independent living facility at [facility] [place].  She states that she has had nausea and vomiting starting since this morning and has not been able to keep anything down today.  She denies any diarrhea.  She has not had a bowel movement today.  No known fevers.  She does have a sore throat which she says started last night.  No runny nose or congestion.  No fevers.  No urinary symptoms.        Past Medical History:  Diagnosis Date  . Arthritis   . GERD (gastroesophageal reflux disease)   . Heart disease   . Hiatal hernia   . Hypercholesterolemia   . Hypertension   . IBS (irritable bowel syndrome)     Patient Active Problem List   Diagnosis Date Noted  . Moderate protein-calorie malnutrition ([place]) 11/28/2014  . Bigeminy 11/23/2014  . Trigeminy 11/23/2014  . Essential hypertension 11/21/2014  . Hypomagnesemia 11/18/2014  . Normocytic anemia 11/18/2014  . Hypokalemia 11/17/2014  . Leukocytosis 11/17/2014  . Thrombocytopenia ([place]) 11/17/2014  . Nausea and vomiting 11/16/2014  . Dyslipidemia 11/16/2014  . SBO (small bowel obstruction) ([place]) 11/15/2014  . CAD (coronary artery disease), native coronary artery 11/15/2014    Past Surgical History:  Procedure Laterality Date  . ABDOMINAL HYSTERECTOMY  1986  . APPENDECTOMY  1973  . CARDIAC CATHETERIZATION    . CATARACT EXTRACTION  2006  . CHOLECYSTECTOMY  1973  . EYE SURGERY    . LAPAROSCOPIC LYSIS OF ADHESIONS  11/23/2014   Procedure: LAPAROSCOPIC LYSIS OF ADHESIONS;  Surgeon: Leighton Ruff, MD;  Location: [facility] ORS;   Service: General;;  . LAPAROSCOPY N/A 11/23/2014   Procedure: DIAGNOSTIC LAPAROSCOPY;  Surgeon: Leighton Ruff, MD;  Location: [facility] ORS;  Service: General;  Laterality: N/A;  . Meniscus Tear  2009  . ULNAR NERVE REPAIR Left 2013     OB History   No obstetric history on file.     Family History  Problem Relation Age of Onset  . Diabetes Mother   . Hypertension Mother   . Heart attack Mother   . Heart attack Father   . Diabetes Sister   . Hypertension Brother   . Heart attack Brother   . Heart attack Sister   . Stroke Sister     Social History   Tobacco Use  . Smoking status: Former Smoker    Packs/day: 1.00    Years: 2.00    Pack years: 2.00    Types: Cigarettes    Quit date: 10/27/1989    Years since quitting: 29.5  . Smokeless tobacco: Never Used  Substance Use Topics  . Alcohol use: Yes    Alcohol/week: 0.0 standard drinks    Comment: Occasional - 1 glass of wine per week  . Drug use: No    Home Medications Prior to Admission medications   Medication Sig Start Date End Date Taking? Authorizing Provider  AMITIZA 24 MCG capsule Take 1 capsule by mouth 2 (two) times daily. 09/07/14  Yes [provider]  aspirin 81 MG chewable tablet Chew 81 mg  by mouth daily.   Yes [provider]  bumetanide (BUMEX) 1 MG tablet Take 1 tablet by mouth daily. 08/31/14  Yes [provider]  carvedilol (COREG) 3.125 MG tablet Take 3.125 mg by mouth 2 (two) times daily. 08/31/14  Yes [provider]  donepezil (ARICEPT) 5 MG tablet Take 5 mg by mouth daily.  04/08/16  Yes [provider]  dorzolamidel-timolol (COSOPT) 22.3-6.8 MG/ML SOLN ophthalmic solution 1 drop 2 (two) times daily.   Yes [provider]  isosorbide mononitrate (IMDUR) 60 MG 24 hr tablet Take 60 mg by mouth daily.    Yes [provider]  lisinopril (PRINIVIL,ZESTRIL) 5 MG tablet Take 5 mg by mouth daily. 08/31/14  Yes [provider]  mirtazapine (REMERON) 15  MG tablet Take 15 mg by mouth at bedtime as needed (sleep).    Yes [provider]  Multiple Vitamin (MULTIVITAMIN) tablet Take 1 tablet by mouth daily.   Yes [provider]  nitroGLYCERIN (NITROSTAT) 0.4 MG SL tablet Place 0.4 mg under the tongue every 5 (five) minutes as needed for chest pain (3 doses max).    Yes [provider]  omeprazole (PRILOSEC) 40 MG capsule Take 40 mg by mouth 2 (two) times daily. 10/15/14  Yes [provider]  simvastatin (ZOCOR) 40 MG tablet Take 40 mg by mouth every evening. 09/27/14  Yes [provider]  TRAVATAN Z 0.004 % SOLN ophthalmic solution Place 1 drop into both eyes at bedtime.  04/07/16  Yes [provider]  vitamin E 100 UNIT capsule Take by mouth daily.   Yes [provider]  diclofenac sodium (VOLTAREN) 1 % GEL APPLY 2 GRAMS TOPICALLY 4 TIMES A DAY Patient not taking: Reported on 04/29/2019 09/15/18   Aundra Dubin, PA-C  meloxicam (MOBIC) 7.5 MG tablet Take 1 tablet (7.5 mg total) by mouth 2 (two) times daily as needed for pain. Patient not taking: Reported on 04/29/2019 03/18/17   Leandrew Koyanagi, MD    Allergies    Patient has no known allergies.  Review of Systems   Review of Systems  Constitutional: Negative for chills, diaphoresis, fatigue and fever.  HENT: Positive for sore throat. Negative for congestion, rhinorrhea and sneezing.   Eyes: Negative.   Respiratory: Negative for cough, chest tightness and shortness of breath.   Cardiovascular: Negative for chest pain and leg swelling.  Gastrointestinal: Positive for nausea and vomiting. Negative for abdominal pain, blood in stool and diarrhea.  Genitourinary: Negative for difficulty urinating, flank pain, frequency and hematuria.  Musculoskeletal: Negative for arthralgias and back pain.  Skin: Negative for rash.  Neurological: Negative for dizziness, speech difficulty, weakness, numbness and headaches.    Physical Exam Updated Vital  Signs BP (!) 111/59   Pulse 98   Temp 97.6 F (36.4 C)   Resp 18   Ht 5\' 1"  (1.549 m)   Wt 65.8 kg   SpO2 97%   BMI 27.40 kg/m   Physical Exam Constitutional:      Appearance: She is well-developed.  HENT:     Head: Normocephalic and atraumatic.     Mouth/Throat:     Mouth: Mucous membranes are moist.     Pharynx: Posterior oropharyngeal erythema present. No oropharyngeal exudate.     Comments: Mild erythema to the posterior pharynx, no exudates, uvula is midline, no trismus Eyes:     Pupils: Pupils are equal, round, and reactive to light.  Cardiovascular:     Rate and Rhythm:  Normal rate and regular rhythm.     Heart sounds: Murmur present.  Pulmonary:     Effort: Pulmonary effort is normal. No respiratory distress.     Breath sounds: Normal breath sounds. No wheezing or rales.  Chest:     Chest wall: No tenderness.  Abdominal:     General: Bowel sounds are normal.     Palpations: Abdomen is soft.     Tenderness: There is no abdominal tenderness. There is no guarding or rebound.  Musculoskeletal:        General: Normal range of motion.     Cervical back: Normal range of motion and neck supple.  Lymphadenopathy:     Cervical: No cervical adenopathy.  Skin:    General: Skin is warm and dry.     Findings: No rash.  Neurological:     Mental Status: She is alert and oriented to person, place, and time.     ED Results / Procedures / Treatments   Labs (all labs ordered are listed, but only abnormal results are displayed) Labs Reviewed  COMPREHENSIVE METABOLIC PANEL - Abnormal; Notable for the following components:      Result Value   Potassium 2.6 (*)    Glucose, Bld 132 (*)    Calcium 8.5 (*)    All other components within normal limits  CBC WITH DIFFERENTIAL/PLATELET - Abnormal; Notable for the following components:   WBC 14.0 (*)    Neutro Abs 10.5 (*)    Monocytes Absolute 1.1 (*)    All other components within normal limits  GROUP A STREP BY PCR  SARS  CORONAVIRUS 2 (TAT 6-24 HRS)  LIPASE, BLOOD  MAGNESIUM  URINALYSIS, ROUTINE W REFLEX MICROSCOPIC    EKG None  Radiology CT Abdomen Pelvis W Contrast  Result Date: 04/29/2019 CLINICAL DATA:  86 year old female with nausea vomiting. EXAM: CT ABDOMEN AND PELVIS WITH CONTRAST TECHNIQUE: Multidetector CT imaging of the abdomen and pelvis was performed using the standard protocol following bolus administration of intravenous contrast. CONTRAST:  148mL OMNIPAQUE IOHEXOL 300 MG/ML  SOLN COMPARISON:  CT abdomen pelvis dated 11/15/2014. FINDINGS: Lower chest: Mild diffuse chronic interstitial coarsening. The visualized lung bases are clear. There is coronary vascular calcification and partial calcification of the mitral annulus. No intra-abdominal free air or free fluid. Hepatobiliary: The liver is unremarkable. No intrahepatic biliary ductal dilatation. Cholecystectomy. No retained calcified stone noted in the central CBD. Pancreas: A cluster of several small cystic lesions noted in the uncinate process of the pancreas similar to prior CT. No acute inflammatory changes. No dilatation of the main pancreatic duct. Spleen: Normal in size without focal abnormality. Adrenals/Urinary Tract: The adrenal glands are unremarkable. There is no hydronephrosis on either side. There is symmetric enhancement and excretion of contrast by both kidneys. The visualized ureters and urinary bladder appear unremarkable. Stomach/Bowel: There is a moderate size hiatal hernia. There is redundancy of the sigmoid colon with extension into the right upper abdomen likely through a mesenteric defect. No evidence of obstruction. Small scattered colonic diverticula without active inflammatory changes. There is no bowel obstruction or active inflammation. Appendectomy. Vascular/Lymphatic: Moderate aortoiliac atherosclerotic disease. The IVC is unremarkable. No portal venous gas. There is a 12 mm rim calcified splenic artery aneurysm similar to  prior CT. No adenopathy. Reproductive: Hysterectomy. No adnexal masses. Other: There is swirling of the mesentery in the pelvis, likely postsurgical. Musculoskeletal: Osteopenia with multilevel degenerative changes of the spine. No acute osseous pathology. IMPRESSION: 1. No acute intra-abdominal or pelvic  pathology. 2. Moderate size hiatal hernia. 3. Colonic diverticulosis. No bowel obstruction. 4. A 12 mm rim calcified splenic artery aneurysm similar to prior CT. 5. Aortic Atherosclerosis (ICD10-I70.0). Electronically Signed   By: Anner Crete M.D.   On: 04/29/2019 21:27    Procedures Procedures (including critical care time)  Medications Ordered in ED Medications  potassium chloride 10 mEq in 100 mL IVPB (10 mEq Intravenous New Bag/Given 04/29/19 2205)  sodium chloride (PF) 0.9 % injection (has no administration in time range)  iohexol (OMNIPAQUE) 300 MG/ML solution 100 mL (100 mLs Intravenous Contrast Given 04/29/19 2103)  ondansetron (ZOFRAN) injection 4 mg (4 mg Intravenous Given 04/29/19 2202)  sodium chloride 0.9 % bolus 500 mL (0 mLs Intravenous Stopped 04/29/19 2230)  potassium chloride SA (KLOR-CON) CR tablet 40 mEq (40 mEq Oral Given 04/29/19 2250)    ED Course  I have reviewed the triage vital signs and the nursing notes.  Pertinent labs & imaging results that were available during my care of the patient were reviewed by me and considered in my medical decision making (see chart for details).    MDM Rules/Calculators/A&P                      Patient is a 86 year old female who presents with nausea and vomiting.  She has not had any vomiting in the ED.  Her CT scan shows no acute abnormality.  Her labs show a marked hypokalemia at 2.6.  She was started on IV potassium replacement.  Magnesium level was ordered.  Her urinalysis is still pending.  I spoke with Dr. Velia Meyer who will admit the pt for further treatment. Final Clinical Impression(s) / ED Diagnoses Final diagnoses:    Non-intractable vomiting with nausea, unspecified vomiting type  Hypokalemia    Rx / DC Orders ED Discharge Orders    None       Malvin Johns, MD 04/29/19 2257

## 2019-04-29 NOTE — ED Triage Notes (Signed)
Pt arrived via EMS from independent living at [facility]. Pt has had N/V since noon, with four separate vomiting episodes. Her last oral intake was last night at dinner.

## 2019-04-29 NOTE — ED Notes (Signed)
Date and time results received: 04/29/19 2035 (use smartphrase ".now" to insert current time)  Test: Potassium Critical Value: 2.6  Name of Provider Notified: Malvin Johns, MD  Orders Received? Or Actions Taken?: IV potassium given

## 2019-04-30 ENCOUNTER — Encounter (HOSPITAL_COMMUNITY): Payer: Self-pay | Admitting: Internal Medicine

## 2019-04-30 ENCOUNTER — Observation Stay (HOSPITAL_COMMUNITY): Payer: Medicare Other

## 2019-04-30 DIAGNOSIS — E785 Hyperlipidemia, unspecified: Secondary | ICD-10-CM | POA: Diagnosis not present

## 2019-04-30 DIAGNOSIS — Z20822 Contact with and (suspected) exposure to covid-19: Secondary | ICD-10-CM | POA: Diagnosis not present

## 2019-04-30 DIAGNOSIS — D72829 Elevated white blood cell count, unspecified: Secondary | ICD-10-CM | POA: Diagnosis not present

## 2019-04-30 DIAGNOSIS — K449 Diaphragmatic hernia without obstruction or gangrene: Secondary | ICD-10-CM | POA: Diagnosis not present

## 2019-04-30 DIAGNOSIS — E876 Hypokalemia: Secondary | ICD-10-CM | POA: Diagnosis not present

## 2019-04-30 DIAGNOSIS — K5730 Diverticulosis of large intestine without perforation or abscess without bleeding: Secondary | ICD-10-CM | POA: Diagnosis not present

## 2019-04-30 DIAGNOSIS — I10 Essential (primary) hypertension: Secondary | ICD-10-CM | POA: Diagnosis not present

## 2019-04-30 DIAGNOSIS — R112 Nausea with vomiting, unspecified: Secondary | ICD-10-CM | POA: Diagnosis not present

## 2019-04-30 DIAGNOSIS — I700 Atherosclerosis of aorta: Secondary | ICD-10-CM | POA: Diagnosis not present

## 2019-04-30 DIAGNOSIS — I728 Aneurysm of other specified arteries: Secondary | ICD-10-CM | POA: Diagnosis not present

## 2019-04-30 DIAGNOSIS — I2510 Atherosclerotic heart disease of native coronary artery without angina pectoris: Secondary | ICD-10-CM | POA: Diagnosis not present

## 2019-04-30 DIAGNOSIS — K219 Gastro-esophageal reflux disease without esophagitis: Secondary | ICD-10-CM | POA: Diagnosis not present

## 2019-04-30 LAB — CBC WITH DIFFERENTIAL/PLATELET
Abs Immature Granulocytes: 0.04 10*3/uL (ref 0.00–0.07)
Basophils Absolute: 0 10*3/uL (ref 0.0–0.1)
Basophils Relative: 0 %
Eosinophils Absolute: 0 10*3/uL (ref 0.0–0.5)
Eosinophils Relative: 0 %
HCT: 35.4 % — ABNORMAL LOW (ref 36.0–46.0)
Hemoglobin: 11.4 g/dL — ABNORMAL LOW (ref 12.0–15.0)
Immature Granulocytes: 0 %
Lymphocytes Relative: 19 %
Lymphs Abs: 2.1 10*3/uL (ref 0.7–4.0)
MCH: 31 pg (ref 26.0–34.0)
MCHC: 32.2 g/dL (ref 30.0–36.0)
MCV: 96.2 fL (ref 80.0–100.0)
Monocytes Absolute: 1 10*3/uL (ref 0.1–1.0)
Monocytes Relative: 9 %
Neutro Abs: 7.9 10*3/uL — ABNORMAL HIGH (ref 1.7–7.7)
Neutrophils Relative %: 72 %
Platelets: 204 10*3/uL (ref 150–400)
RBC: 3.68 MIL/uL — ABNORMAL LOW (ref 3.87–5.11)
RDW: 14.6 % (ref 11.5–15.5)
WBC: 11.1 10*3/uL — ABNORMAL HIGH (ref 4.0–10.5)
nRBC: 0 % (ref 0.0–0.2)

## 2019-04-30 LAB — URINE CULTURE

## 2019-04-30 LAB — MAGNESIUM: Magnesium: 2.1 mg/dL (ref 1.7–2.4)

## 2019-04-30 LAB — BASIC METABOLIC PANEL
Anion gap: 12 (ref 5–15)
BUN: 15 mg/dL (ref 8–23)
CO2: 20 mmol/L — ABNORMAL LOW (ref 22–32)
Calcium: 8.4 mg/dL — ABNORMAL LOW (ref 8.9–10.3)
Chloride: 104 mmol/L (ref 98–111)
Creatinine, Ser: 0.98 mg/dL (ref 0.44–1.00)
GFR calc Af Amer: >60 mL/min (ref >60)
GFR calc non Af Amer: 53 mL/min — ABNORMAL LOW (ref >60)
Glucose, Bld: 130 mg/dL — ABNORMAL HIGH (ref 70–99)
Potassium: 3.5 mmol/L (ref 3.5–5.1)
Sodium: 136 mmol/L (ref 135–145)

## 2019-04-30 LAB — SARS CORONAVIRUS 2 (TAT 6-24 HRS): SARS Coronavirus 2: NEGATIVE

## 2019-04-30 MED ORDER — LISINOPRIL 5 MG PO TABS
5.0000 mg | ORAL_TABLET | Freq: Every day | ORAL | Status: DC
  Administered 2019-04-30: 5 mg via ORAL
  Filled 2019-04-30: qty 1

## 2019-04-30 MED ORDER — ONDANSETRON HCL 4 MG/2ML IJ SOLN: 4.0000 mg | Freq: Four times a day (QID) | INTRAMUSCULAR | Status: DC | PRN

## 2019-04-30 MED ORDER — ACETAMINOPHEN 650 MG RE SUPP: 650.0000 mg | Freq: Four times a day (QID) | RECTAL | Status: DC | PRN

## 2019-04-30 MED ORDER — ALUM & MAG HYDROXIDE-SIMETH 200-200-20 MG/5ML PO SUSP
30.0000 mL | ORAL | Status: DC | PRN
  Administered 2019-04-30: 30 mL via ORAL
  Filled 2019-04-30: qty 30

## 2019-04-30 MED ORDER — ENOXAPARIN SODIUM 40 MG/0.4ML ~~LOC~~ SOLN: 40.0000 mg | SUBCUTANEOUS | Status: DC

## 2019-04-30 MED ORDER — SIMVASTATIN 40 MG PO TABS: 40.0000 mg | ORAL_TABLET | Freq: Every evening | ORAL | Status: DC

## 2019-04-30 MED ORDER — ASPIRIN 81 MG PO CHEW
81.0000 mg | CHEWABLE_TABLET | Freq: Every day | ORAL | Status: DC
  Administered 2019-04-30: 09:00:00 81 mg via ORAL
  Filled 2019-04-30: qty 1

## 2019-04-30 MED ORDER — DONEPEZIL HCL 5 MG PO TABS
5.0000 mg | ORAL_TABLET | Freq: Every day | ORAL | Status: DC
  Administered 2019-04-30: 5 mg via ORAL
  Filled 2019-04-30: qty 1

## 2019-04-30 MED ORDER — ISOSORBIDE MONONITRATE ER 60 MG PO TB24
60.0000 mg | ORAL_TABLET | Freq: Every day | ORAL | Status: DC
  Administered 2019-04-30: 60 mg via ORAL
  Filled 2019-04-30: qty 1

## 2019-04-30 MED ORDER — ACETAMINOPHEN 325 MG PO TABS: 650.0000 mg | ORAL_TABLET | Freq: Four times a day (QID) | ORAL | Status: DC | PRN

## 2019-04-30 MED ORDER — POTASSIUM CHLORIDE 10 MEQ/100ML IV SOLN
10.0000 meq | INTRAVENOUS | Status: AC
  Administered 2019-04-30: 10 meq via INTRAVENOUS
  Filled 2019-04-30: qty 100

## 2019-04-30 MED ORDER — ONDANSETRON HCL 4 MG PO TABS: 4.0000 mg | ORAL_TABLET | Freq: Four times a day (QID) | ORAL | Status: DC | PRN

## 2019-04-30 MED ORDER — CARVEDILOL 3.125 MG PO TABS
3.1250 mg | ORAL_TABLET | Freq: Two times a day (BID) | ORAL | Status: DC
  Administered 2019-04-30: 3.125 mg via ORAL
  Filled 2019-04-30: qty 1

## 2019-04-30 MED ORDER — SODIUM CHLORIDE 0.9% FLUSH: 3.0000 mL | Freq: Two times a day (BID) | INTRAVENOUS | Status: DC

## 2019-04-30 NOTE — Discharge Summary (Signed)
Physician Discharge Summary  Leslie Frey [id number] DOB: 1932-08-22 DOA: 04/29/2019  PCP: Velna Hatchet, MD  Admit date: 04/29/2019 Discharge date: 04/30/2019  Admitted From: ILF Disposition: ILF   Recommendations for Outpatient Follow-up:  1. Follow up with PCP in 1-2 weeks 2. Please obtain BMP/CBC in one week  Home Health: None recommended Equipment/Devices: None recommended Discharge Condition: Stable CODE STATUS: Full Diet recommendation: Heart healthy  Brief/Interim Summary: Per HPI: Leslie Frey is a 86 y.o. female with medical history significant for hypertension, hyperlipidemia, GERD, small bowel obstruction, who is admitted to [facility] on 04/29/2019 with hypokalemia after presenting from SNF to [facility] Emergency Department complaining of nausea/vomiting.   The patient reports 1 day of nausea resulting in 4 episodes of nonbloody, nonbilious emesis over that time.  She reports associated mild epigastric discomfort, which started after the first 2 episodes of vomiting.  She describes her abdominal discomfort as sharp and nonradiating, with exacerbation during the act of vomiting.  Denies any recent trauma or travel.  Denies any associated diarrhea, melena, hematochezia, or rash.  Denies any associated subjective fever, chills, rigors, or generalized myalgias. Denies any recent headache, neck stiffness, rhinitis, rhinorrhea, sore throat, sob, cough. No known COVID-19 exposures. Denies dysuria, gross hematuria, or change in urinary urgency/frequency.   Denies any associated chest pain, palpitations, diaphoresis, dizziness, presyncope, or syncope.    ED Course:  Vital signs in the ED were notable for the following: Temperature max 97.6; heart rate 71-84; blood pressure 103/72-120 3/77; respiratory rate 17-18; and oxygen saturation 97 to 98% on room air.  Labs were notable for the following: CMP notable for the following: Sodium 137, potassium 2.6,  bicarbonate 24, creatinine 0.80, and liver enzymes were found to be within normal limits.  Serum magnesium found to be 2.0.  Lipase 41.  CBC notable for white blood cell count of 14,000 with 75% neutrophils.  Urinalysis notable for 6-10 white blood cells, rare bacteria, and was noted to be nitrate negative.  Screening nasopharyngeal COVID-19 PCR was obtained in the ED this evening, with result currently pending.  Given the patient's report of abdominal discomfort, as further described above, in the context of a documented history of small bowel stricturing, CT abdomen/pelvis was obtained, and demonstrated no evidence of acute intra-abdominal process.  While in the ED, the following were administered: Normal saline x500 cc bolus, potassium chloride 40 mEq p.o. x1, potassium chloride 20 mEq IV over 2 hours x 1, and Zofran 4 mg IV x1.  Subsequently, the patient was admitted to the med telemetry floor for further evaluation and management of presenting hypokalemia in the setting of nausea/vomiting.  Discharge Diagnoses:  Principal Problem:   Hypokalemia Active Problems:   Nausea and vomiting   Dyslipidemia   Leukocytosis   Essential hypertension  Hypokalemia: Improved with supplementation and now tolerating po intake without GI losses.   Nausea and vomiting: Resolved with tolerance of diet. No longer requiring IV fluids nor antiemetics.CT A/P with no acute intra-abdominal or pelvic pathology. Moderate size hiatal hernia. Colonic diverticulosis. No bowel obstruction.  12 mm rim calcified splenic artery aneurysm: Seen on imaging similar to prior CT.  Aortic Atherosclerosis: Noted  Leukocytosis: Presenting CBC notable for white blood cell count of 14,000 with 75% neutrophils which has improved without antimicrobials. She has no urinary symptoms to suggest UTI and no pulmonary symptoms with no infiltrate on CXR and no wounds. Covid negative. Will continue monitoring off  antimicrobials.  Essential hypertension: Continue home antihypertensive  medications.   Hyperlipidemia: Continue statin.  GERD: On omeprazole as an outpatient.  Overweight: Body mass index is 27.4 kg/m.   Discharge Instructions  Allergies as of 04/30/2019   No Known Allergies     Medication List    TAKE these medications   Amitiza 24 MCG capsule Generic drug: lubiprostone Take 1 capsule by mouth 2 (two) times daily.   aspirin 81 MG chewable tablet Chew 81 mg by mouth daily.   bumetanide 1 MG tablet Commonly known as: BUMEX Take 1 tablet by mouth daily.   carvedilol 3.125 MG tablet Commonly known as: COREG Take 3.125 mg by mouth 2 (two) times daily.   diclofenac sodium 1 % Gel Commonly known as: VOLTAREN APPLY 2 GRAMS TOPICALLY 4 TIMES A DAY   donepezil 5 MG tablet Commonly known as: ARICEPT Take 5 mg by mouth daily.   dorzolamidel-timolol 22.3-6.8 MG/ML Soln ophthalmic solution Commonly known as: COSOPT 1 drop 2 (two) times daily.   isosorbide mononitrate 60 MG 24 hr tablet Commonly known as: IMDUR Take 60 mg by mouth daily.   lisinopril 5 MG tablet Commonly known as: ZESTRIL Take 5 mg by mouth daily.   meloxicam 7.5 MG tablet Commonly known as: Mobic Take 1 tablet (7.5 mg total) by mouth 2 (two) times daily as needed for pain.   mirtazapine 15 MG tablet Commonly known as: REMERON Take 15 mg by mouth at bedtime as needed (sleep).   multivitamin tablet Take 1 tablet by mouth daily.   nitroGLYCERIN 0.4 MG SL tablet Commonly known as: NITROSTAT Place 0.4 mg under the tongue every 5 (five) minutes as needed for chest pain (3 doses max).   omeprazole 40 MG capsule Commonly known as: PRILOSEC Take 40 mg by mouth 2 (two) times daily.   simvastatin 40 MG tablet Commonly known as: ZOCOR Take 40 mg by mouth every evening.   Travatan Z 0.004 % Soln ophthalmic solution Generic drug: Travoprost (BAK Free) Place 1 drop into both eyes at bedtime.    vitamin E 45 MG (100 UNITS) capsule Take by mouth daily.       No Known Allergies  Consultations:  None  Procedures/Studies: CT Abdomen Pelvis W Contrast  Result Date: 04/29/2019 CLINICAL DATA:  86 year old female with nausea vomiting. EXAM: CT ABDOMEN AND PELVIS WITH CONTRAST TECHNIQUE: Multidetector CT imaging of the abdomen and pelvis was performed using the standard protocol following bolus administration of intravenous contrast. CONTRAST:  152mL OMNIPAQUE IOHEXOL 300 MG/ML  SOLN COMPARISON:  CT abdomen pelvis dated 11/15/2014. FINDINGS: Lower chest: Mild diffuse chronic interstitial coarsening. The visualized lung bases are clear. There is coronary vascular calcification and partial calcification of the mitral annulus. No intra-abdominal free air or free fluid. Hepatobiliary: The liver is unremarkable. No intrahepatic biliary ductal dilatation. Cholecystectomy. No retained calcified stone noted in the central CBD. Pancreas: A cluster of several small cystic lesions noted in the uncinate process of the pancreas similar to prior CT. No acute inflammatory changes. No dilatation of the main pancreatic duct. Spleen: Normal in size without focal abnormality. Adrenals/Urinary Tract: The adrenal glands are unremarkable. There is no hydronephrosis on either side. There is symmetric enhancement and excretion of contrast by both kidneys. The visualized ureters and urinary bladder appear unremarkable. Stomach/Bowel: There is a moderate size hiatal hernia. There is redundancy of the sigmoid colon with extension into the right upper abdomen likely through a mesenteric defect. No evidence of obstruction. Small scattered colonic diverticula without active inflammatory changes. There is no  bowel obstruction or active inflammation. Appendectomy. Vascular/Lymphatic: Moderate aortoiliac atherosclerotic disease. The IVC is unremarkable. No portal venous gas. There is a 12 mm rim calcified splenic artery aneurysm  similar to prior CT. No adenopathy. Reproductive: Hysterectomy. No adnexal masses. Other: There is swirling of the mesentery in the pelvis, likely postsurgical. Musculoskeletal: Osteopenia with multilevel degenerative changes of the spine. No acute osseous pathology. IMPRESSION: 1. No acute intra-abdominal or pelvic pathology. 2. Moderate size hiatal hernia. 3. Colonic diverticulosis. No bowel obstruction. 4. A 12 mm rim calcified splenic artery aneurysm similar to prior CT. 5. Aortic Atherosclerosis (ICD10-I70.0). Electronically Signed   By: Anner Crete M.D.   On: 04/29/2019 21:27   Portable chest 1 View  Result Date: 04/30/2019 CLINICAL DATA:  Leukocytosis EXAM: PORTABLE CHEST 1 VIEW COMPARISON:  11/24/2014 FINDINGS: Cardiac shadow is stable. Aortic calcifications are noted and stable. The lungs are well aerated bilaterally. No focal infiltrate or sizable effusion is seen. No acute bony abnormality is noted. IMPRESSION: No acute abnormality noted. Electronically Signed   By: Inez Catalina M.D.   On: 04/30/2019 00:55    Subjective: Wants to go home. Ate little breakfast, but has had no nausea since arrival to the floor last night. Denies any abdominal pain, dysuria, urinary frequency/urgency, hematuria. Ate a full lunch without nausea today. Worked with PT later in the morning and is at functional baseline with no home health needs identified per report.  Discharge Exam: Vitals:   04/30/19 0133 04/30/19 0605  BP: 121/74 116/68  Pulse: 89 85  Resp: 20 20  Temp: 98.5 F (36.9 C) 98.8 F (37.1 C)  SpO2: 100% 97%   General: Pt is alert, awake, not in acute distress Cardiovascular: RRR, S1/S2 +, no rubs, no gallops Respiratory: CTA bilaterally, no wheezing, no rhonchi Abdominal: Soft, NT, ND, bowel sounds + Extremities: No edema, no cyanosis  Labs: BNP (last 3 results) No results for input(s): BNP in the last 8760 hours. Basic Metabolic Panel: Recent Labs  Lab 04/29/19 1956  04/30/19 0307  NA 137 136  K 2.6* 3.5  CL 102 104  CO2 24 20*  GLUCOSE 132* 130*  BUN 15 15  CREATININE 0.80 0.98  CALCIUM 8.5* 8.4*  MG 2.0 2.1   Liver Function Tests: Recent Labs  Lab 04/29/19 1956  AST 33  ALT 23  ALKPHOS 51  BILITOT 0.8  PROT 6.5  ALBUMIN 3.7   Recent Labs  Lab 04/29/19 1956  LIPASE 41   No results for input(s): AMMONIA in the last 168 hours. CBC: Recent Labs  Lab 04/29/19 1956 04/30/19 0307  WBC 14.0* 11.1*  NEUTROABS 10.5* 7.9*  HGB 12.4 11.4*  HCT 38.7 35.4*  MCV 95.6 96.2  PLT 218 204   Cardiac Enzymes: No results for input(s): CKTOTAL, CKMB, CKMBINDEX, TROPONINI in the last 168 hours. BNP: Invalid input(s): POCBNP CBG: No results for input(s): GLUCAP in the last 168 hours. D-Dimer No results for input(s): DDIMER in the last 72 hours. Hgb A1c No results for input(s): HGBA1C in the last 72 hours. Lipid Profile No results for input(s): CHOL, HDL, LDLCALC, TRIG, CHOLHDL, LDLDIRECT in the last 72 hours. Thyroid function studies No results for input(s): TSH, T4TOTAL, T3FREE, THYROIDAB in the last 72 hours.  Invalid input(s): FREET3 Anemia work up No results for input(s): VITAMINB12, FOLATE, FERRITIN, TIBC, IRON, RETICCTPCT in the last 72 hours. Urinalysis    Component Value Date/Time   COLORURINE YELLOW 04/29/2019 [place] 04/29/2019 2248  LABSPEC 1.028 04/29/2019 2248   PHURINE 6.0 04/29/2019 2248   GLUCOSEU NEGATIVE 04/29/2019 2248   HGBUR NEGATIVE 04/29/2019 2248   BILIRUBINUR NEGATIVE 04/29/2019 2248   KETONESUR 20 (A) 04/29/2019 2248   PROTEINUR NEGATIVE 04/29/2019 2248   UROBILINOGEN 0.2 11/16/2014 1157   NITRITE NEGATIVE 04/29/2019 2248   LEUKOCYTESUR MODERATE (A) 04/29/2019 2248    Microbiology Recent Results (from the past 240 hour(s))  Group A Strep by PCR     Status: None   Collection Time: 04/29/19  7:56 PM   Specimen: Throat; Sterile Swab  Result Value Ref Range Status   Group A Strep  by PCR NOT DETECTED NOT DETECTED Final    Comment: Performed at [facility], [place] [address], [place], [place] [address]  SARS CORONAVIRUS 2 (TAT 6-24 HRS) Nasopharyngeal Nasopharyngeal Swab     Status: None   Collection Time: 04/29/19 11:07 PM   Specimen: Nasopharyngeal Swab  Result Value Ref Range Status   SARS Coronavirus 2 NEGATIVE NEGATIVE Final    Comment: (NOTE) SARS-CoV-2 target nucleic acids are NOT DETECTED. The SARS-CoV-2 RNA is generally detectable in upper and lower respiratory specimens during the acute phase of infection. Negative results do not preclude SARS-CoV-2 infection, do not rule out co-infections with other pathogens, and should not be used as the sole basis for treatment or other patient management decisions. Negative results must be combined with clinical observations, patient history, and epidemiological information. The expected result is Negative. Fact Sheet for Patients: SugarRoll.be Fact Sheet for Healthcare Providers: https://www.woods-mathews.com/ This test is not yet approved or cleared by the [place] FDA and  has been authorized for detection and/or diagnosis of SARS-CoV-2 by FDA under an Emergency Use Authorization (EUA). This EUA will remain  in effect (meaning this test can be used) for the duration of the COVID-19 declaration under Section 56 4(b)(1) of the Act, 21 U.S.C. section 360bbb-3(b)(1), unless the authorization is terminated or revoked sooner. Performed at [facility], [place] [address], [place], [place] [address]     Time coordinating discharge: Approximately 40 minutes  Patrecia Pour, MD  [facility] 04/30/2019, 2:33 PM

## 2019-04-30 NOTE — Evaluation (Signed)
Physical Therapy One Time Evaluation Patient Details Name: Leslie Frey MRN: [id number] DOB: Jul 09, 1932 Today's Date: 04/30/2019   History of Present Illness  86 y.o. female with medical history significant for hypertension, hyperlipidemia, GERD, small bowel obstruction, who is admitted to [facility] on 04/29/2019 with hypokalemia after presenting from ILF to [facility] Emergency Department complaining of nausea/vomiting.  Clinical Impression  Patient evaluated by Physical Therapy with no further acute PT needs identified. All education has been completed and the patient has no further questions.  See below for any follow-up Physical Therapy or equipment needs. PT is signing off. Thank you for this referral.  Pt eager to mobilize to prove she can d/c back to ILF.  Pt performing well and feels at her baseline. Pt feels ready for d/c home today.  RN notified.     Follow Up Recommendations No PT follow up    Equipment Recommendations  None recommended by PT    Recommendations for Other Services       Precautions / Restrictions Precautions Precautions: Fall      Mobility  Bed Mobility Overal bed mobility: Modified Independent                Transfers Overall transfer level: Needs assistance Equipment used: None Transfers: Sit to/from Stand Sit to Stand: Supervision            Ambulation/Gait Ambulation/Gait assistance: Min guard;Supervision Gait Distance (Feet): 360 Feet Assistive device: None Gait Pattern/deviations: Step-through pattern;Decreased stride length     General Gait Details: a little unsteady at times however no physical assist required and no overt LOB observed; pt reports gait feeling at baseline  Stairs            Wheelchair Mobility    Modified Rankin (Stroke Patients Only)       Balance Overall balance assessment: Mild deficits observed, not formally tested(does not require assistive device for mobility and reports  no hx of falls)                                           Pertinent Vitals/Pain Pain Assessment: No/denies pain    Home Living       Type of Home: Independent living facility         Home Equipment: Kasandra Knudsen - single point      Prior Function Level of Independence: Independent               Hand Dominance        Extremity/Trunk Assessment   Upper Extremity Assessment Upper Extremity Assessment: Overall WFL for tasks assessed    Lower Extremity Assessment Lower Extremity Assessment: Overall WFL for tasks assessed       Communication   Communication: No difficulties  Cognition Arousal/Alertness: Awake/alert Behavior During Therapy: WFL for tasks assessed/performed Overall Cognitive Status: Within Functional Limits for tasks assessed                                        General Comments      Exercises     Assessment/Plan    PT Assessment Patent does not need any further PT services  PT Problem List         PT Treatment Interventions      PT Goals (Current  goals can be found in the Care Plan section)  Acute Rehab PT Goals PT Goal Formulation: All assessment and education complete, DC therapy    Frequency     Barriers to discharge        Co-evaluation               AM-PAC PT "6 Clicks" Mobility  Outcome Measure Help needed turning from your back to your side while in a flat bed without using bedrails?: None Help needed moving from lying on your back to sitting on the side of a flat bed without using bedrails?: None Help needed moving to and from a bed to a chair (including a wheelchair)?: None Help needed standing up from a chair using your arms (e.g., wheelchair or bedside chair)?: None Help needed to walk in hospital room?: A Little Help needed climbing 3-5 steps with a railing? : A Little 6 Click Score: 22    End of Session Equipment Utilized During Treatment: Gait belt Activity Tolerance:  Patient tolerated treatment well Patient left: in bed;with call bell/phone within reach;with bed alarm set;with nursing/sitter in room Nurse Communication: Mobility status PT Visit Diagnosis: Difficulty in walking, not elsewhere classified (R26.2)    Time: [id number] PT Time Calculation (min) (ACUTE ONLY): 8 min   Charges:   PT Evaluation $PT Eval Low Complexity: 1 Low     Kati PT, DPT Acute Rehabilitation Services Office: [phone]  Trena Platt 04/30/2019, 4:23 PM

## 2019-04-30 NOTE — Discharge Instructions (Signed)
Nausea, Adult Nausea is feeling sick to your stomach or feeling that you are about to throw up (vomit). Feeling sick to your stomach is usually not serious, but it may be an early sign of a more serious medical problem. As you feel sicker to your stomach, you may throw up. If you throw up, or if you are not able to drink enough fluids, there is a risk that you may lose too much water in your body (get dehydrated). If you lose too much water in your body, you may:  Feel tired.  Feel thirsty.  Have a dry mouth.  Have cracked lips.  Go pee (urinate) less often. Older adults and people who have other diseases or a weak body defense system (immune system) have a higher risk of losing too much water in the body. The main goals of treating this condition are:  To relieve your nausea.  To ensure your nausea occurs less often.  To prevent throwing up and losing too much fluid. Follow these instructions at home: Watch your symptoms for any changes. Tell your doctor about them. Follow these instructions as told by your doctor. Eating and drinking      Take an ORS (oral rehydration solution). This is a drink that is sold at pharmacies and stores.  Drink clear fluids in small amounts as you are able. These include: ? Water. ? Ice chips. ? Fruit juice that has water added (diluted fruit juice). ? Low-calorie sports drinks.  Eat bland, easy-to-digest foods in small amounts as you are able, such as: ? Bananas. ? Applesauce. ? Rice. ? Low-fat (lean) meats. ? Toast. ? Crackers.  Avoid drinking fluids that have a lot of sugar or caffeine in them. This includes energy drinks, sports drinks, and soda.  Avoid alcohol.  Avoid spicy or fatty foods. General instructions  Take over-the-counter and prescription medicines only as told by your doctor.  Rest at home while you get better.  Drink enough fluid to keep your pee (urine) pale yellow.  Take slow and deep breaths when you feel  sick to your stomach.  Avoid food or things that have strong smells.  Wash your hands often with soap and water. If you cannot use soap and water, use hand sanitizer.  Make sure that all people in your home wash their hands well and often.  Keep all follow-up visits as told by your doctor. This is important. Contact a doctor if:  You feel sicker to your stomach.  You feel sick to your stomach for more than 2 days.  You throw up.  You are not able to drink fluids without throwing up.  You have new symptoms.  You have a fever.  You have a headache.  You have muscle cramps.  You have a rash.  You have pain while peeing.  You feel light-headed or dizzy. Get help right away if:  You have pain in your chest, neck, arm, or jaw.  You feel very weak or you pass out (faint).  You have throw up that is bright red or looks like coffee grounds.  You have bloody or black poop (stools) or poop that looks like tar.  You have a very bad headache, a stiff neck, or both.  You have very bad pain, cramping, or bloating in your belly (abdomen).  You have trouble breathing or you are breathing very quickly.  Your heart is beating very quickly.  Your skin feels cold and clammy.  You feel confused.    You have signs of losing too much water in your body, such as: ? Dark pee, very little pee, or no pee. ? Cracked lips. ? Dry mouth. ? Sunken eyes. ? Sleepiness. ? Weakness. These symptoms may be an emergency. Do not wait to see if the symptoms will go away. Get medical help right away. Call your local emergency services (911 in the [place] Do not drive yourself to the hospital. Summary  Nausea is feeling sick to your stomach or feeling that you are about to throw up (vomit).  If you throw up, or if you are not able to drink enough fluids, there is a risk that you may lose too much water in your body (get dehydrated).  Eat and drink what your doctor tells you. Take  over-the-counter and prescription medicines only as told by your doctor.  Contact a doctor right away if your symptoms get worse or you have new symptoms.  Keep all follow-up visits as told by your doctor. This is important. This information is not intended to replace advice given to you by your health care provider. Make sure you discuss any questions you have with your health care provider. Document Revised: 07/23/2017 Document Reviewed: 07/23/2017 Elsevier Patient Education  [place].   Hypokalemia Hypokalemia means that the amount of potassium in the blood is lower than normal. Potassium is a chemical (electrolyte) that helps regulate the amount of fluid in the body. It also stimulates muscle tightening (contraction) and helps nerves work properly. Normally, most of the body's potassium is inside cells, and only a very small amount is in the blood. Because the amount in the blood is so small, minor changes to potassium levels in the blood can be life-threatening. What are the causes? This condition may be caused by:  Antibiotic medicine.  Diarrhea or vomiting. Taking too much of a medicine that helps you have a bowel movement (laxative) can cause diarrhea and lead to hypokalemia.  Chronic kidney disease (CKD).  Medicines that help the body get rid of excess fluid (diuretics).  Eating disorders, such as bulimia.  Low magnesium levels in the body.  Sweating a lot. What are the signs or symptoms? Symptoms of this condition include:  Weakness.  Constipation.  Fatigue.  Muscle cramps.  Mental confusion.  Skipped heartbeats or irregular heartbeat (palpitations).  Tingling or numbness. How is this diagnosed? This condition is diagnosed with a blood test. How is this treated? This condition may be treated by:  Taking potassium supplements by mouth.  Adjusting the medicines that you take.  Eating more foods that contain a lot of potassium. If your potassium  level is very low, you may need to get potassium through an IV and be monitored in the hospital. Follow these instructions at home:   Take over-the-counter and prescription medicines only as told by your health care provider. This includes vitamins and supplements.  Eat a healthy diet. A healthy diet includes fresh fruits and vegetables, whole grains, healthy fats, and lean proteins.  If instructed, eat more foods that contain a lot of potassium. This includes: ? Nuts, such as peanuts and pistachios. ? Seeds, such as sunflower seeds and pumpkin seeds. ? Peas, lentils, and lima beans. ? Whole grain and bran cereals and breads. ? Fresh fruits and vegetables, such as apricots, avocado, bananas, cantaloupe, kiwi, oranges, tomatoes, asparagus, and potatoes. ? Orange juice. ? Tomato juice. ? Red meats. ? Yogurt.  Keep all follow-up visits as told by your health care provider.  This is important. Contact a health care provider if you:  Have weakness that gets worse.  Feel your heart pounding or racing.  Vomit.  Have diarrhea.  Have diabetes (diabetes mellitus) and you have trouble keeping your blood sugar (glucose) in your target range. Get help right away if you:  Have chest pain.  Have shortness of breath.  Have vomiting or diarrhea that lasts for more than 2 days.  Faint. Summary  Hypokalemia means that the amount of potassium in the blood is lower than normal.  This condition is diagnosed with a blood test.  Hypokalemia may be treated by taking potassium supplements, adjusting the medicines that you take, or eating more foods that are high in potassium.  If your potassium level is very low, you may need to get potassium through an IV and be monitored in the hospital. This information is not intended to replace advice given to you by your health care provider. Make sure you discuss any questions you have with your health care provider. Document Revised: 09/25/2017  Document Reviewed: 09/25/2017 Elsevier Patient Education  [place].

## 2019-05-05 NOTE — Progress Notes (Signed)
The diagnosis requiring PT evaluation is generalized weakness in advanced age patient exacerbated by hypokalemia and dehydration from vomiting.  Vance Gather, MD 05/05/2019 6:56 PM

## 2019-05-15 DIAGNOSIS — M6281 Muscle weakness (generalized): Secondary | ICD-10-CM | POA: Diagnosis not present

## 2019-05-15 DIAGNOSIS — R41841 Cognitive communication deficit: Secondary | ICD-10-CM | POA: Diagnosis not present

## 2019-05-18 DIAGNOSIS — M6281 Muscle weakness (generalized): Secondary | ICD-10-CM | POA: Diagnosis not present

## 2019-05-18 DIAGNOSIS — R41841 Cognitive communication deficit: Secondary | ICD-10-CM | POA: Diagnosis not present

## 2019-05-19 DIAGNOSIS — M6281 Muscle weakness (generalized): Secondary | ICD-10-CM | POA: Diagnosis not present

## 2019-05-19 DIAGNOSIS — R41841 Cognitive communication deficit: Secondary | ICD-10-CM | POA: Diagnosis not present

## 2019-05-22 DIAGNOSIS — R41841 Cognitive communication deficit: Secondary | ICD-10-CM | POA: Diagnosis not present

## 2019-05-22 DIAGNOSIS — M6281 Muscle weakness (generalized): Secondary | ICD-10-CM | POA: Diagnosis not present

## 2019-05-25 DIAGNOSIS — H401113 Primary open-angle glaucoma, right eye, severe stage: Secondary | ICD-10-CM | POA: Diagnosis not present

## 2019-05-25 DIAGNOSIS — H401122 Primary open-angle glaucoma, left eye, moderate stage: Secondary | ICD-10-CM | POA: Diagnosis not present

## 2019-05-26 DIAGNOSIS — M6281 Muscle weakness (generalized): Secondary | ICD-10-CM | POA: Diagnosis not present

## 2019-05-26 DIAGNOSIS — R41841 Cognitive communication deficit: Secondary | ICD-10-CM | POA: Diagnosis not present

## 2019-05-27 DIAGNOSIS — R41841 Cognitive communication deficit: Secondary | ICD-10-CM | POA: Diagnosis not present

## 2019-05-27 DIAGNOSIS — M6281 Muscle weakness (generalized): Secondary | ICD-10-CM | POA: Diagnosis not present

## 2019-05-28 DIAGNOSIS — R2681 Unsteadiness on feet: Secondary | ICD-10-CM | POA: Diagnosis not present

## 2019-05-28 DIAGNOSIS — M6281 Muscle weakness (generalized): Secondary | ICD-10-CM | POA: Diagnosis not present

## 2019-05-28 DIAGNOSIS — R41841 Cognitive communication deficit: Secondary | ICD-10-CM | POA: Diagnosis not present

## 2019-05-29 DIAGNOSIS — M6281 Muscle weakness (generalized): Secondary | ICD-10-CM | POA: Diagnosis not present

## 2019-05-29 DIAGNOSIS — R41841 Cognitive communication deficit: Secondary | ICD-10-CM | POA: Diagnosis not present

## 2019-05-29 DIAGNOSIS — R2681 Unsteadiness on feet: Secondary | ICD-10-CM | POA: Diagnosis not present

## 2019-06-03 DIAGNOSIS — R41841 Cognitive communication deficit: Secondary | ICD-10-CM | POA: Diagnosis not present

## 2019-06-03 DIAGNOSIS — R2681 Unsteadiness on feet: Secondary | ICD-10-CM | POA: Diagnosis not present

## 2019-06-03 DIAGNOSIS — M6281 Muscle weakness (generalized): Secondary | ICD-10-CM | POA: Diagnosis not present

## 2019-06-04 DIAGNOSIS — R2681 Unsteadiness on feet: Secondary | ICD-10-CM | POA: Diagnosis not present

## 2019-06-04 DIAGNOSIS — M6281 Muscle weakness (generalized): Secondary | ICD-10-CM | POA: Diagnosis not present

## 2019-06-04 DIAGNOSIS — R41841 Cognitive communication deficit: Secondary | ICD-10-CM | POA: Diagnosis not present

## 2019-06-08 DIAGNOSIS — R2681 Unsteadiness on feet: Secondary | ICD-10-CM | POA: Diagnosis not present

## 2019-06-08 DIAGNOSIS — M6281 Muscle weakness (generalized): Secondary | ICD-10-CM | POA: Diagnosis not present

## 2019-06-08 DIAGNOSIS — R41841 Cognitive communication deficit: Secondary | ICD-10-CM | POA: Diagnosis not present

## 2019-06-11 DIAGNOSIS — R41841 Cognitive communication deficit: Secondary | ICD-10-CM | POA: Diagnosis not present

## 2019-06-11 DIAGNOSIS — M6281 Muscle weakness (generalized): Secondary | ICD-10-CM | POA: Diagnosis not present

## 2019-06-11 DIAGNOSIS — R2681 Unsteadiness on feet: Secondary | ICD-10-CM | POA: Diagnosis not present

## 2019-06-15 DIAGNOSIS — M6281 Muscle weakness (generalized): Secondary | ICD-10-CM | POA: Diagnosis not present

## 2019-06-15 DIAGNOSIS — R41841 Cognitive communication deficit: Secondary | ICD-10-CM | POA: Diagnosis not present

## 2019-06-15 DIAGNOSIS — R2681 Unsteadiness on feet: Secondary | ICD-10-CM | POA: Diagnosis not present

## 2019-06-16 DIAGNOSIS — R41841 Cognitive communication deficit: Secondary | ICD-10-CM | POA: Diagnosis not present

## 2019-06-16 DIAGNOSIS — R2681 Unsteadiness on feet: Secondary | ICD-10-CM | POA: Diagnosis not present

## 2019-06-16 DIAGNOSIS — M6281 Muscle weakness (generalized): Secondary | ICD-10-CM | POA: Diagnosis not present

## 2019-06-17 DIAGNOSIS — R41841 Cognitive communication deficit: Secondary | ICD-10-CM | POA: Diagnosis not present

## 2019-06-17 DIAGNOSIS — R2681 Unsteadiness on feet: Secondary | ICD-10-CM | POA: Diagnosis not present

## 2019-06-17 DIAGNOSIS — M6281 Muscle weakness (generalized): Secondary | ICD-10-CM | POA: Diagnosis not present

## 2019-06-18 DIAGNOSIS — R41841 Cognitive communication deficit: Secondary | ICD-10-CM | POA: Diagnosis not present

## 2019-06-18 DIAGNOSIS — M6281 Muscle weakness (generalized): Secondary | ICD-10-CM | POA: Diagnosis not present

## 2019-06-18 DIAGNOSIS — R2681 Unsteadiness on feet: Secondary | ICD-10-CM | POA: Diagnosis not present

## 2019-06-19 DIAGNOSIS — R2681 Unsteadiness on feet: Secondary | ICD-10-CM | POA: Diagnosis not present

## 2019-06-19 DIAGNOSIS — M6281 Muscle weakness (generalized): Secondary | ICD-10-CM | POA: Diagnosis not present

## 2019-06-19 DIAGNOSIS — R41841 Cognitive communication deficit: Secondary | ICD-10-CM | POA: Diagnosis not present

## 2019-06-22 DIAGNOSIS — R2681 Unsteadiness on feet: Secondary | ICD-10-CM | POA: Diagnosis not present

## 2019-06-22 DIAGNOSIS — M6281 Muscle weakness (generalized): Secondary | ICD-10-CM | POA: Diagnosis not present

## 2019-06-22 DIAGNOSIS — R41841 Cognitive communication deficit: Secondary | ICD-10-CM | POA: Diagnosis not present

## 2019-06-23 DIAGNOSIS — R41841 Cognitive communication deficit: Secondary | ICD-10-CM | POA: Diagnosis not present

## 2019-06-23 DIAGNOSIS — M6281 Muscle weakness (generalized): Secondary | ICD-10-CM | POA: Diagnosis not present

## 2019-06-23 DIAGNOSIS — R2681 Unsteadiness on feet: Secondary | ICD-10-CM | POA: Diagnosis not present

## 2019-06-24 DIAGNOSIS — M6281 Muscle weakness (generalized): Secondary | ICD-10-CM | POA: Diagnosis not present

## 2019-06-24 DIAGNOSIS — R2681 Unsteadiness on feet: Secondary | ICD-10-CM | POA: Diagnosis not present

## 2019-06-24 DIAGNOSIS — R41841 Cognitive communication deficit: Secondary | ICD-10-CM | POA: Diagnosis not present

## 2019-06-26 DIAGNOSIS — R41841 Cognitive communication deficit: Secondary | ICD-10-CM | POA: Diagnosis not present

## 2019-06-26 DIAGNOSIS — R2681 Unsteadiness on feet: Secondary | ICD-10-CM | POA: Diagnosis not present

## 2019-06-26 DIAGNOSIS — M6281 Muscle weakness (generalized): Secondary | ICD-10-CM | POA: Diagnosis not present

## 2019-06-27 DIAGNOSIS — M6281 Muscle weakness (generalized): Secondary | ICD-10-CM | POA: Diagnosis not present

## 2019-06-27 DIAGNOSIS — R2681 Unsteadiness on feet: Secondary | ICD-10-CM | POA: Diagnosis not present

## 2019-06-27 DIAGNOSIS — R41841 Cognitive communication deficit: Secondary | ICD-10-CM | POA: Diagnosis not present

## 2019-06-29 DIAGNOSIS — R2681 Unsteadiness on feet: Secondary | ICD-10-CM | POA: Diagnosis not present

## 2019-06-29 DIAGNOSIS — R41841 Cognitive communication deficit: Secondary | ICD-10-CM | POA: Diagnosis not present

## 2019-06-29 DIAGNOSIS — M6281 Muscle weakness (generalized): Secondary | ICD-10-CM | POA: Diagnosis not present

## 2019-06-30 DIAGNOSIS — M6281 Muscle weakness (generalized): Secondary | ICD-10-CM | POA: Diagnosis not present

## 2019-06-30 DIAGNOSIS — R41841 Cognitive communication deficit: Secondary | ICD-10-CM | POA: Diagnosis not present

## 2019-06-30 DIAGNOSIS — R2681 Unsteadiness on feet: Secondary | ICD-10-CM | POA: Diagnosis not present

## 2019-07-01 DIAGNOSIS — M6281 Muscle weakness (generalized): Secondary | ICD-10-CM | POA: Diagnosis not present

## 2019-07-01 DIAGNOSIS — R41841 Cognitive communication deficit: Secondary | ICD-10-CM | POA: Diagnosis not present

## 2019-07-01 DIAGNOSIS — R2681 Unsteadiness on feet: Secondary | ICD-10-CM | POA: Diagnosis not present

## 2019-07-02 DIAGNOSIS — R2681 Unsteadiness on feet: Secondary | ICD-10-CM | POA: Diagnosis not present

## 2019-07-02 DIAGNOSIS — M6281 Muscle weakness (generalized): Secondary | ICD-10-CM | POA: Diagnosis not present

## 2019-07-02 DIAGNOSIS — R41841 Cognitive communication deficit: Secondary | ICD-10-CM | POA: Diagnosis not present

## 2019-07-07 DIAGNOSIS — M6281 Muscle weakness (generalized): Secondary | ICD-10-CM | POA: Diagnosis not present

## 2019-07-07 DIAGNOSIS — R2681 Unsteadiness on feet: Secondary | ICD-10-CM | POA: Diagnosis not present

## 2019-07-07 DIAGNOSIS — R41841 Cognitive communication deficit: Secondary | ICD-10-CM | POA: Diagnosis not present

## 2019-07-09 DIAGNOSIS — M6281 Muscle weakness (generalized): Secondary | ICD-10-CM | POA: Diagnosis not present

## 2019-07-09 DIAGNOSIS — R41841 Cognitive communication deficit: Secondary | ICD-10-CM | POA: Diagnosis not present

## 2019-07-09 DIAGNOSIS — R2681 Unsteadiness on feet: Secondary | ICD-10-CM | POA: Diagnosis not present

## 2019-07-14 DIAGNOSIS — M6281 Muscle weakness (generalized): Secondary | ICD-10-CM | POA: Diagnosis not present

## 2019-07-14 DIAGNOSIS — R2681 Unsteadiness on feet: Secondary | ICD-10-CM | POA: Diagnosis not present

## 2019-07-14 DIAGNOSIS — R41841 Cognitive communication deficit: Secondary | ICD-10-CM | POA: Diagnosis not present

## 2019-07-16 DIAGNOSIS — M6281 Muscle weakness (generalized): Secondary | ICD-10-CM | POA: Diagnosis not present

## 2019-07-16 DIAGNOSIS — R41841 Cognitive communication deficit: Secondary | ICD-10-CM | POA: Diagnosis not present

## 2019-07-16 DIAGNOSIS — R2681 Unsteadiness on feet: Secondary | ICD-10-CM | POA: Diagnosis not present

## 2019-07-20 DIAGNOSIS — R41841 Cognitive communication deficit: Secondary | ICD-10-CM | POA: Diagnosis not present

## 2019-07-20 DIAGNOSIS — R2681 Unsteadiness on feet: Secondary | ICD-10-CM | POA: Diagnosis not present

## 2019-07-20 DIAGNOSIS — M6281 Muscle weakness (generalized): Secondary | ICD-10-CM | POA: Diagnosis not present

## 2019-07-23 DIAGNOSIS — R41841 Cognitive communication deficit: Secondary | ICD-10-CM | POA: Diagnosis not present

## 2019-07-23 DIAGNOSIS — M6281 Muscle weakness (generalized): Secondary | ICD-10-CM | POA: Diagnosis not present

## 2019-07-23 DIAGNOSIS — R2681 Unsteadiness on feet: Secondary | ICD-10-CM | POA: Diagnosis not present

## 2019-07-24 DIAGNOSIS — R2681 Unsteadiness on feet: Secondary | ICD-10-CM | POA: Diagnosis not present

## 2019-07-24 DIAGNOSIS — R41841 Cognitive communication deficit: Secondary | ICD-10-CM | POA: Diagnosis not present

## 2019-07-24 DIAGNOSIS — M6281 Muscle weakness (generalized): Secondary | ICD-10-CM | POA: Diagnosis not present

## 2019-10-01 ENCOUNTER — Emergency Department (HOSPITAL_COMMUNITY): Payer: Medicare Other

## 2019-10-01 ENCOUNTER — Emergency Department (HOSPITAL_COMMUNITY)
Admission: EM | Admit: 2019-10-01 | Discharge: 2019-10-02 | Disposition: A | Discharge status: Nursing Home (Includes ICF) | Payer: Medicare Other | Attending: Emergency Medicine | Admitting: Emergency Medicine

## 2019-10-01 ENCOUNTER — Encounter (HOSPITAL_COMMUNITY): Payer: Self-pay | Admitting: Emergency Medicine

## 2019-10-01 DIAGNOSIS — I119 Hypertensive heart disease without heart failure: Secondary | ICD-10-CM | POA: Insufficient documentation

## 2019-10-01 DIAGNOSIS — W19XXXA Unspecified fall, initial encounter: Secondary | ICD-10-CM | POA: Diagnosis not present

## 2019-10-01 DIAGNOSIS — R42 Dizziness and giddiness: Secondary | ICD-10-CM | POA: Diagnosis not present

## 2019-10-01 DIAGNOSIS — I10 Essential (primary) hypertension: Secondary | ICD-10-CM | POA: Diagnosis not present

## 2019-10-01 DIAGNOSIS — S0003XA Contusion of scalp, initial encounter: Secondary | ICD-10-CM | POA: Diagnosis not present

## 2019-10-01 DIAGNOSIS — S199XXA Unspecified injury of neck, initial encounter: Secondary | ICD-10-CM | POA: Diagnosis not present

## 2019-10-01 DIAGNOSIS — Z7982 Long term (current) use of aspirin: Secondary | ICD-10-CM | POA: Insufficient documentation

## 2019-10-01 DIAGNOSIS — S3992XA Unspecified injury of lower back, initial encounter: Secondary | ICD-10-CM | POA: Diagnosis not present

## 2019-10-01 DIAGNOSIS — I959 Hypotension, unspecified: Secondary | ICD-10-CM | POA: Diagnosis not present

## 2019-10-01 DIAGNOSIS — S5002XA Contusion of left elbow, initial encounter: Secondary | ICD-10-CM | POA: Diagnosis not present

## 2019-10-01 DIAGNOSIS — Z87891 Personal history of nicotine dependence: Secondary | ICD-10-CM | POA: Insufficient documentation

## 2019-10-01 DIAGNOSIS — S0990XA Unspecified injury of head, initial encounter: Secondary | ICD-10-CM | POA: Insufficient documentation

## 2019-10-01 DIAGNOSIS — Y939 Activity, unspecified: Secondary | ICD-10-CM | POA: Insufficient documentation

## 2019-10-01 DIAGNOSIS — Y999 Unspecified external cause status: Secondary | ICD-10-CM | POA: Diagnosis not present

## 2019-10-01 DIAGNOSIS — M542 Cervicalgia: Secondary | ICD-10-CM | POA: Diagnosis not present

## 2019-10-01 DIAGNOSIS — Y929 Unspecified place or not applicable: Secondary | ICD-10-CM | POA: Insufficient documentation

## 2019-10-01 DIAGNOSIS — M533 Sacrococcygeal disorders, not elsewhere classified: Secondary | ICD-10-CM | POA: Diagnosis not present

## 2019-10-01 DIAGNOSIS — Z79899 Other long term (current) drug therapy: Secondary | ICD-10-CM | POA: Diagnosis not present

## 2019-10-01 DIAGNOSIS — R58 Hemorrhage, not elsewhere classified: Secondary | ICD-10-CM | POA: Diagnosis not present

## 2019-10-01 DIAGNOSIS — R000 Tachycardia, unspecified: Secondary | ICD-10-CM | POA: Diagnosis not present

## 2019-10-01 LAB — CBC
HCT: 35.9 % — ABNORMAL LOW (ref 36.0–46.0)
Hemoglobin: 11.8 g/dL — ABNORMAL LOW (ref 12.0–15.0)
MCH: 32.2 pg (ref 26.0–34.0)
MCHC: 32.9 g/dL (ref 30.0–36.0)
MCV: 98.1 fL (ref 80.0–100.0)
Platelets: 130 10*3/uL — ABNORMAL LOW (ref 150–400)
RBC: 3.66 MIL/uL — ABNORMAL LOW (ref 3.87–5.11)
RDW: 15.4 % (ref 11.5–15.5)
WBC: 7.9 10*3/uL (ref 4.0–10.5)
nRBC: 0 % (ref 0.0–0.2)

## 2019-10-01 LAB — CBG MONITORING, ED: Glucose-Capillary: 118 mg/dL — ABNORMAL HIGH (ref 70–99)

## 2019-10-01 MED ORDER — ACETAMINOPHEN 500 MG PO TABS
1000.0000 mg | ORAL_TABLET | Freq: Once | ORAL | Status: AC
  Administered 2019-10-01: 1000 mg via ORAL
  Filled 2019-10-01: qty 2

## 2019-10-01 NOTE — Discharge Instructions (Signed)
RETURN TO ER IF ANY SUDDEN CONFUSION, LETHARGY, VOMITING, OR NEW COMPLAINTS OF PAIN.

## 2019-10-01 NOTE — ED Notes (Signed)
Call received from pt daughter Irineo Axon [phone] requesting rtn call for pt status/updates when possible. Caller requests information re: transportation home upon d/c. [employer]

## 2019-10-01 NOTE — ED Provider Notes (Signed)
[facility] DEPT Provider Note   CSN: [id number] Arrival date & time: 10/01/19  1735     History Chief Complaint  Patient presents with  . Fall    Leslie Frey is a 87 y.o. female.  86 year old female with past medical history below including dementia, hypertension, hyperlipidemia, GERD, CAD, SBO who presents with fall.  Nursing facility reported to EMS that the patient had a fall while walking into her room this evening.  She told them she felt dizzy prior to the fall.  Patient states that she thinks she hit her head but she does not recall events surrounding the fall or how she got to the ED.  She reports some mild pain on the back of her head and some bruising on her left elbow but denies any joint pains.  She does report pain on her buttocks where she landed.  LEVEL 5 CAVEAT DUE TO DEMENTIA  The history is provided by the EMS personnel and the nursing home.  Fall       Past Medical History:  Diagnosis Date  . Arthritis   . GERD (gastroesophageal reflux disease)   . Heart disease   . Hiatal hernia   . Hypercholesterolemia   . Hypertension   . IBS (irritable bowel syndrome)     Patient Active Problem List   Diagnosis Date Noted  . Moderate protein-calorie malnutrition ([place]) 11/28/2014  . Bigeminy 11/23/2014  . Trigeminy 11/23/2014  . Essential hypertension 11/21/2014  . Hypomagnesemia 11/18/2014  . Normocytic anemia 11/18/2014  . Hypokalemia 11/17/2014  . Leukocytosis 11/17/2014  . Thrombocytopenia ([place]) 11/17/2014  . Nausea and vomiting 11/16/2014  . Dyslipidemia 11/16/2014  . SBO (small bowel obstruction) ([place]) 11/15/2014  . CAD (coronary artery disease), native coronary artery 11/15/2014    Past Surgical History:  Procedure Laterality Date  . ABDOMINAL HYSTERECTOMY  1986  . APPENDECTOMY  1973  . CARDIAC CATHETERIZATION    . CATARACT EXTRACTION  2006  . CHOLECYSTECTOMY  1973  . EYE SURGERY    . LAPAROSCOPIC LYSIS OF  ADHESIONS  11/23/2014   Procedure: LAPAROSCOPIC LYSIS OF ADHESIONS;  Surgeon: Leighton Ruff, MD;  Location: [facility];  Service: General;;  . LAPAROSCOPY N/A 11/23/2014   Procedure: DIAGNOSTIC LAPAROSCOPY;  Surgeon: Leighton Ruff, MD;  Location: [facility];  Service: General;  Laterality: N/A;  . Meniscus Tear  2009  . ULNAR NERVE REPAIR Left 2013     OB History   No obstetric history on file.     Family History  Problem Relation Age of Onset  . Diabetes Mother   . Hypertension Mother   . Heart attack Mother   . Heart attack Father   . Diabetes Sister   . Hypertension Brother   . Heart attack Brother   . Heart attack Sister   . Stroke Sister     Social History   Tobacco Use  . Smoking status: Former Smoker    Packs/day: 1.00    Years: 2.00    Pack years: 2.00    Types: Cigarettes    Quit date: 10/27/1989    Years since quitting: 29.9  . Smokeless tobacco: Never Used  Substance Use Topics  . Alcohol use: Yes    Alcohol/week: 0.0 standard drinks    Comment: Occasional - 1 glass of wine per week  . Drug use: No    Home Medications Prior to Admission medications   Medication Sig Start Date End Date Taking? Authorizing Provider  AMITIZA 24 MCG  capsule Take 1 capsule by mouth 2 (two) times daily. 09/07/14   [provider]  aspirin 81 MG chewable tablet Chew 81 mg by mouth daily.    [provider]  bumetanide (BUMEX) 1 MG tablet Take 1 tablet by mouth daily. 08/31/14   [provider]  carvedilol (COREG) 3.125 MG tablet Take 3.125 mg by mouth 2 (two) times daily. 08/31/14   [provider]  diclofenac sodium (VOLTAREN) 1 % GEL APPLY 2 GRAMS TOPICALLY 4 TIMES A DAY Patient not taking: Reported on 04/29/2019 09/15/18   Aundra Dubin, PA-C  donepezil (ARICEPT) 5 MG tablet Take 5 mg by mouth daily.  04/08/16   [provider]  dorzolamide-timolol (COSOPT) 22.3-6.8 MG/ML ophthalmic solution  09/28/19   [provider]   dorzolamidel-timolol (COSOPT) 22.3-6.8 MG/ML SOLN ophthalmic solution 1 drop 2 (two) times daily.    [provider]  isosorbide mononitrate (IMDUR) 60 MG 24 hr tablet Take 60 mg by mouth daily.     [provider]  lisinopril (PRINIVIL,ZESTRIL) 5 MG tablet Take 5 mg by mouth daily. 08/31/14   [provider]  meloxicam (MOBIC) 7.5 MG tablet Take 1 tablet (7.5 mg total) by mouth 2 (two) times daily as needed for pain. Patient not taking: Reported on 04/29/2019 03/18/17   Leandrew Koyanagi, MD  mirtazapine (REMERON) 15 MG tablet Take 15 mg by mouth at bedtime as needed (sleep).     [provider]  Multiple Vitamin (MULTIVITAMIN) tablet Take 1 tablet by mouth daily.    [provider]  Multiple Vitamins-Minerals (CENTRUM SILVER) tablet  08/24/19   [provider]  nitroGLYCERIN (NITROSTAT) 0.4 MG SL tablet Place 0.4 mg under the tongue every 5 (five) minutes as needed for chest pain (3 doses max).     [provider]  omeprazole (PRILOSEC) 40 MG capsule Take 40 mg by mouth 2 (two) times daily. 10/15/14   [provider]  simvastatin (ZOCOR) 40 MG tablet Take 40 mg by mouth every evening. 09/27/14   [provider]  TRAVATAN Z 0.004 % SOLN ophthalmic solution Place 1 drop into both eyes at bedtime.  04/07/16   [provider]  vitamin E 100 UNIT capsule Take by mouth daily.    [provider]    Allergies    Patient has no known allergies.  Review of Systems   Review of Systems  Unable to perform ROS: Dementia    Physical Exam Updated Vital Signs BP 113/83   Pulse 83   Temp (!) 97.4 F (36.3 C) (Oral)   Resp (!) 22   SpO2 98%   Physical Exam Vitals and nursing note reviewed.  Constitutional:      General: She is not in acute distress.    Appearance: She is well-developed.  HENT:     Head: Normocephalic.     Comments: Mild swelling L parietal to occipital scalp Eyes:     Conjunctiva/sclera:  Conjunctivae normal.  Cardiovascular:     Rate and Rhythm: Normal rate and regular rhythm.     Heart sounds: Murmur heard.   Pulmonary:     Effort: Pulmonary effort is normal.     Breath sounds: Normal breath sounds.  Abdominal:     General: Bowel sounds are normal. There is no distension.     Palpations: Abdomen is soft.     Tenderness: There is no abdominal tenderness.  Musculoskeletal:        General: No tenderness.  Normal range of motion.     Cervical back: Neck supple.     Comments: ROM normal x all 4 extremities; contusion L elbow and scattered small ecchymoses on L forearm, no focal tenderness; no midline spinal tenderness  Skin:    General: Skin is warm and dry.  Neurological:     Mental Status: She is alert.     Comments: Fluent speech, oriented x 1, following commands     ED Results / Procedures / Treatments   Labs (all labs ordered are listed, but only abnormal results are displayed) Labs Reviewed  CBC - Abnormal; Notable for the following components:      Result Value   RBC 3.66 (*)    Hemoglobin 11.8 (*)    HCT 35.9 (*)    Platelets 130 (*)    All other components within normal limits  CBG MONITORING, ED - Abnormal; Notable for the following components:   Glucose-Capillary 118 (*)    All other components within normal limits  URINALYSIS, ROUTINE W REFLEX MICROSCOPIC  BASIC METABOLIC PANEL    EKG EKG Interpretation  Date/Time:  Thursday October 01 2019 18:00:42 EDT Ventricular Rate:  75 PR Interval:    QRS Duration: 97 QT Interval:  411 QTC Calculation: 460 R Axis:   65 Text Interpretation: Sinus rhythm Atrial premature complex Low voltage, extremity leads Nonspecific T abnormalities, diffuse leads 12 Lead; [place]ar No significant change since last tracing Confirmed by Theotis Burrow [phone]) on 10/01/2019 9:54:16 PM   Radiology DG Sacrum/Coccyx  Result Date: 10/01/2019 CLINICAL DATA:  Fall with coccygeal pain EXAM: SACRUM AND COCCYX - 2+ VIEW  COMPARISON:  None. FINDINGS: SI joints are non widened. Pubic symphysis and rami appear intact. The sacrum is largely obscured by bowel gas on the frontal projection. No definitive fracture is seen on lateral view. A rounded lucent centered calcification anterior to the sacrum is probably within the subcutaneous soft tissues based on prior CT IMPRESSION: No definite acute osseous abnormality. Electronically Signed   By: Donavan Foil M.D.   On: 10/01/2019 22:34   CT Head Wo Contrast  Result Date: 10/01/2019 CLINICAL DATA:  Dizziness leading to fall. Hematoma to posterior. Posterior neck pain. EXAM: CT HEAD WITHOUT CONTRAST CT CERVICAL SPINE WITHOUT CONTRAST TECHNIQUE: Multidetector CT imaging of the head and cervical spine was performed following the standard protocol without intravenous contrast. Multiplanar CT image reconstructions of the cervical spine were also generated. COMPARISON:  None. FINDINGS: CT HEAD FINDINGS Brain: Brain volume is normal for age. No intracranial hemorrhage, mass effect, or midline shift. No hydrocephalus. The basilar cisterns are patent. Mild periventricular chronic small vessel ischemia. No evidence of territorial infarct or acute ischemia. No extra-axial or intracranial fluid collection. Vascular: No hyperdense vessel or unexpected calcification. Skull: No fracture or focal lesion. Sinuses/Orbits: Paranasal sinuses and mastoid air cells are clear. The visualized orbits are unremarkable. Other: Left parietal scalp hematoma. CT CERVICAL SPINE FINDINGS Alignment: Grade 1 retrolisthesis of C3 on C4 and anterolisthesis of C7 on T1, likely degenerative. No traumatic subluxation. No jumped or perched facets. Skull base and vertebrae: Bony fusion of C5-C6 vertebral bodies may be remote postsurgical or congenital. No acute fracture. Vertebral body heights are maintained. The dens and skull base are intact. Soft tissues and spinal canal: No prevertebral fluid or swelling. No visible canal  hematoma. Disc levels: Small disc space at C5-C6 which may be congenital or postsurgical. Disc space narrowing and endplate spurring at all additional levels, multilevel facet hypertrophy. Upper  chest: Emphysema. No acute findings. Other: Carotid calcifications. IMPRESSION: 1. Left parietal scalp hematoma. No acute intracranial abnormality. No skull fracture. 2. Multilevel degenerative change throughout the cervical spine without acute fracture or subluxation. Electronically Signed   By: Keith Rake M.D.   On: 10/01/2019 22:33   CT Cervical Spine Wo Contrast  Result Date: 10/01/2019 CLINICAL DATA:  Dizziness leading to fall. Hematoma to posterior. Posterior neck pain. EXAM: CT HEAD WITHOUT CONTRAST CT CERVICAL SPINE WITHOUT CONTRAST TECHNIQUE: Multidetector CT imaging of the head and cervical spine was performed following the standard protocol without intravenous contrast. Multiplanar CT image reconstructions of the cervical spine were also generated. COMPARISON:  None. FINDINGS: CT HEAD FINDINGS Brain: Brain volume is normal for age. No intracranial hemorrhage, mass effect, or midline shift. No hydrocephalus. The basilar cisterns are patent. Mild periventricular chronic small vessel ischemia. No evidence of territorial infarct or acute ischemia. No extra-axial or intracranial fluid collection. Vascular: No hyperdense vessel or unexpected calcification. Skull: No fracture or focal lesion. Sinuses/Orbits: Paranasal sinuses and mastoid air cells are clear. The visualized orbits are unremarkable. Other: Left parietal scalp hematoma. CT CERVICAL SPINE FINDINGS Alignment: Grade 1 retrolisthesis of C3 on C4 and anterolisthesis of C7 on T1, likely degenerative. No traumatic subluxation. No jumped or perched facets. Skull base and vertebrae: Bony fusion of C5-C6 vertebral bodies may be remote postsurgical or congenital. No acute fracture. Vertebral body heights are maintained. The dens and skull base are intact.  Soft tissues and spinal canal: No prevertebral fluid or swelling. No visible canal hematoma. Disc levels: Small disc space at C5-C6 which may be congenital or postsurgical. Disc space narrowing and endplate spurring at all additional levels, multilevel facet hypertrophy. Upper chest: Emphysema. No acute findings. Other: Carotid calcifications. IMPRESSION: 1. Left parietal scalp hematoma. No acute intracranial abnormality. No skull fracture. 2. Multilevel degenerative change throughout the cervical spine without acute fracture or subluxation. Electronically Signed   By: Keith Rake M.D.   On: 10/01/2019 22:33    Procedures Procedures (including critical care time)  Medications Ordered in ED Medications  acetaminophen (TYLENOL) tablet 1,000 mg (1,000 mg Oral Given 10/01/19 2227)    ED Course  I have reviewed the triage vital signs and the nursing notes.  Pertinent labs & imaging results that were available during my care of the patient were reviewed by me and considered in my medical decision making (see chart for details).    MDM Rules/Calculators/A&P                          Pt comfortable and alert on exam, VS reassuring. CT head, c-spine negative acute, XR sacrum/coccyx negative. CBC sent from triage reassuring. EKG similar to previous. Discussed results w/ patient's daughter over the phone who voiced understanding and agrees w/ discharge back to nursing facility via [facility].  Final Clinical Impression(s) / ED Diagnoses Final diagnoses:  Injury of head, initial encounter  Contusion of left elbow, initial encounter    Rx / DC Orders ED Discharge Orders    None       Belal Scallon, Wenda Overland, MD 10/01/19 2346

## 2019-10-01 NOTE — ED Triage Notes (Addendum)
Per EMS, patient from [facility] independent living, reports fall while walking in her room. States she felt dizzy prior to fall. Hematoma to posterior head and skin tear to left elbow.  20g R AC 569ml NS with EMS  Initially hypotensive SBP 90. Last BP 106/62

## 2019-10-02 DIAGNOSIS — R279 Unspecified lack of coordination: Secondary | ICD-10-CM | POA: Diagnosis not present

## 2019-10-02 DIAGNOSIS — S0990XA Unspecified injury of head, initial encounter: Secondary | ICD-10-CM | POA: Diagnosis not present

## 2019-10-02 DIAGNOSIS — R5381 Other malaise: Secondary | ICD-10-CM | POA: Diagnosis not present

## 2019-10-02 DIAGNOSIS — Z743 Need for continuous supervision: Secondary | ICD-10-CM | POA: Diagnosis not present

## 2019-10-02 DIAGNOSIS — W19XXXA Unspecified fall, initial encounter: Secondary | ICD-10-CM | POA: Diagnosis not present

## 2019-10-02 LAB — BASIC METABOLIC PANEL
Anion gap: 10 (ref 5–15)
BUN: 15 mg/dL (ref 8–23)
CO2: 20 mmol/L — ABNORMAL LOW (ref 22–32)
Calcium: 8.5 mg/dL — ABNORMAL LOW (ref 8.9–10.3)
Chloride: 105 mmol/L (ref 98–111)
Creatinine, Ser: 0.85 mg/dL (ref 0.44–1.00)
GFR calc Af Amer: >60 mL/min (ref >60)
GFR calc non Af Amer: >60 mL/min (ref >60)
Glucose, Bld: 115 mg/dL — ABNORMAL HIGH (ref 70–99)
Potassium: 3.2 mmol/L — ABNORMAL LOW (ref 3.5–5.1)
Sodium: 135 mmol/L (ref 135–145)

## 2019-10-02 MED ORDER — POTASSIUM CHLORIDE 20 MEQ/15ML (10%) PO SOLN
40.0000 meq | Freq: Once | ORAL | Status: AC
  Administered 2019-10-02: 40 meq via ORAL
  Filled 2019-10-02: qty 30

## 2019-10-02 NOTE — ED Provider Notes (Signed)
I was asked to check patient's be met prior to being discharged.  She has mild hypokalemia and nonfasting hyperglycemia.  Patient was given liquid oral potassium.  BMET    Component Value Date/Time   NA 135 10/01/2019 2315   K 3.2 (L) 10/01/2019 2315   CL 105 10/01/2019 2315   CO2 20 (L) 10/01/2019 2315   GLUCOSE 115 (H) 10/01/2019 2315   BUN 15 10/01/2019 2315   CREATININE 0.85 10/01/2019 2315   CALCIUM 8.5 (L) 10/01/2019 2315   GFRNONAA >60 10/01/2019 2315   GFRAA >60 10/01/2019 2315   Medications  potassium chloride 20 MEQ/15ML (10%) solution 40 mEq (has no administration in time range)  acetaminophen (TYLENOL) tablet 1,000 mg (1,000 mg Oral Given 10/01/19 2227)       , , MD 10/02/19 0159  

## 2019-10-02 NOTE — ED Notes (Signed)
[facility] [employer] notified of need for transport of pt back to residence.  

## 2019-10-02 NOTE — ED Notes (Signed)
Pt. Is being d/c attempted to call pt. Facility twice with no answer.

## 2019-10-06 DIAGNOSIS — R488 Other symbolic dysfunctions: Secondary | ICD-10-CM | POA: Diagnosis not present

## 2019-10-06 DIAGNOSIS — R41841 Cognitive communication deficit: Secondary | ICD-10-CM | POA: Diagnosis not present

## 2019-10-06 DIAGNOSIS — M6281 Muscle weakness (generalized): Secondary | ICD-10-CM | POA: Diagnosis not present

## 2019-10-06 DIAGNOSIS — R2681 Unsteadiness on feet: Secondary | ICD-10-CM | POA: Diagnosis not present

## 2019-10-07 DIAGNOSIS — R41841 Cognitive communication deficit: Secondary | ICD-10-CM | POA: Diagnosis not present

## 2019-10-07 DIAGNOSIS — R488 Other symbolic dysfunctions: Secondary | ICD-10-CM | POA: Diagnosis not present

## 2019-10-07 DIAGNOSIS — R2681 Unsteadiness on feet: Secondary | ICD-10-CM | POA: Diagnosis not present

## 2019-10-07 DIAGNOSIS — M6281 Muscle weakness (generalized): Secondary | ICD-10-CM | POA: Diagnosis not present

## 2019-10-08 DIAGNOSIS — R41841 Cognitive communication deficit: Secondary | ICD-10-CM | POA: Diagnosis not present

## 2019-10-08 DIAGNOSIS — M6281 Muscle weakness (generalized): Secondary | ICD-10-CM | POA: Diagnosis not present

## 2019-10-08 DIAGNOSIS — R488 Other symbolic dysfunctions: Secondary | ICD-10-CM | POA: Diagnosis not present

## 2019-10-08 DIAGNOSIS — R2681 Unsteadiness on feet: Secondary | ICD-10-CM | POA: Diagnosis not present

## 2019-10-09 DIAGNOSIS — M6281 Muscle weakness (generalized): Secondary | ICD-10-CM | POA: Diagnosis not present

## 2019-10-09 DIAGNOSIS — R488 Other symbolic dysfunctions: Secondary | ICD-10-CM | POA: Diagnosis not present

## 2019-10-09 DIAGNOSIS — R2681 Unsteadiness on feet: Secondary | ICD-10-CM | POA: Diagnosis not present

## 2019-10-09 DIAGNOSIS — R41841 Cognitive communication deficit: Secondary | ICD-10-CM | POA: Diagnosis not present

## 2019-10-12 DIAGNOSIS — R41841 Cognitive communication deficit: Secondary | ICD-10-CM | POA: Diagnosis not present

## 2019-10-12 DIAGNOSIS — R2681 Unsteadiness on feet: Secondary | ICD-10-CM | POA: Diagnosis not present

## 2019-10-12 DIAGNOSIS — M6281 Muscle weakness (generalized): Secondary | ICD-10-CM | POA: Diagnosis not present

## 2019-10-12 DIAGNOSIS — R488 Other symbolic dysfunctions: Secondary | ICD-10-CM | POA: Diagnosis not present

## 2019-10-14 DIAGNOSIS — R488 Other symbolic dysfunctions: Secondary | ICD-10-CM | POA: Diagnosis not present

## 2019-10-14 DIAGNOSIS — R41841 Cognitive communication deficit: Secondary | ICD-10-CM | POA: Diagnosis not present

## 2019-10-14 DIAGNOSIS — M6281 Muscle weakness (generalized): Secondary | ICD-10-CM | POA: Diagnosis not present

## 2019-10-14 DIAGNOSIS — R2681 Unsteadiness on feet: Secondary | ICD-10-CM | POA: Diagnosis not present

## 2019-10-15 DIAGNOSIS — R011 Cardiac murmur, unspecified: Secondary | ICD-10-CM | POA: Diagnosis not present

## 2019-10-15 DIAGNOSIS — I25118 Atherosclerotic heart disease of native coronary artery with other forms of angina pectoris: Secondary | ICD-10-CM | POA: Diagnosis not present

## 2019-10-15 DIAGNOSIS — I10 Essential (primary) hypertension: Secondary | ICD-10-CM | POA: Diagnosis not present

## 2019-10-15 DIAGNOSIS — I959 Hypotension, unspecified: Secondary | ICD-10-CM | POA: Diagnosis not present

## 2019-10-28 DIAGNOSIS — R2681 Unsteadiness on feet: Secondary | ICD-10-CM | POA: Diagnosis not present

## 2019-10-28 DIAGNOSIS — R488 Other symbolic dysfunctions: Secondary | ICD-10-CM | POA: Diagnosis not present

## 2019-10-28 DIAGNOSIS — M6281 Muscle weakness (generalized): Secondary | ICD-10-CM | POA: Diagnosis not present

## 2019-10-28 DIAGNOSIS — R41841 Cognitive communication deficit: Secondary | ICD-10-CM | POA: Diagnosis not present

## 2019-10-29 DIAGNOSIS — R488 Other symbolic dysfunctions: Secondary | ICD-10-CM | POA: Diagnosis not present

## 2019-10-29 DIAGNOSIS — M6281 Muscle weakness (generalized): Secondary | ICD-10-CM | POA: Diagnosis not present

## 2019-10-29 DIAGNOSIS — R41841 Cognitive communication deficit: Secondary | ICD-10-CM | POA: Diagnosis not present

## 2019-10-29 DIAGNOSIS — R2681 Unsteadiness on feet: Secondary | ICD-10-CM | POA: Diagnosis not present

## 2019-10-30 DIAGNOSIS — M6281 Muscle weakness (generalized): Secondary | ICD-10-CM | POA: Diagnosis not present

## 2019-10-30 DIAGNOSIS — R41841 Cognitive communication deficit: Secondary | ICD-10-CM | POA: Diagnosis not present

## 2019-10-30 DIAGNOSIS — R488 Other symbolic dysfunctions: Secondary | ICD-10-CM | POA: Diagnosis not present

## 2019-10-30 DIAGNOSIS — R2681 Unsteadiness on feet: Secondary | ICD-10-CM | POA: Diagnosis not present

## 2019-11-03 DIAGNOSIS — R2681 Unsteadiness on feet: Secondary | ICD-10-CM | POA: Diagnosis not present

## 2019-11-03 DIAGNOSIS — M6281 Muscle weakness (generalized): Secondary | ICD-10-CM | POA: Diagnosis not present

## 2019-11-03 DIAGNOSIS — R488 Other symbolic dysfunctions: Secondary | ICD-10-CM | POA: Diagnosis not present

## 2019-11-03 DIAGNOSIS — R41841 Cognitive communication deficit: Secondary | ICD-10-CM | POA: Diagnosis not present

## 2019-11-04 DIAGNOSIS — R488 Other symbolic dysfunctions: Secondary | ICD-10-CM | POA: Diagnosis not present

## 2019-11-04 DIAGNOSIS — M6281 Muscle weakness (generalized): Secondary | ICD-10-CM | POA: Diagnosis not present

## 2019-11-04 DIAGNOSIS — R2681 Unsteadiness on feet: Secondary | ICD-10-CM | POA: Diagnosis not present

## 2019-11-04 DIAGNOSIS — R41841 Cognitive communication deficit: Secondary | ICD-10-CM | POA: Diagnosis not present

## 2019-11-05 DIAGNOSIS — R2681 Unsteadiness on feet: Secondary | ICD-10-CM | POA: Diagnosis not present

## 2019-11-05 DIAGNOSIS — R488 Other symbolic dysfunctions: Secondary | ICD-10-CM | POA: Diagnosis not present

## 2019-11-05 DIAGNOSIS — M6281 Muscle weakness (generalized): Secondary | ICD-10-CM | POA: Diagnosis not present

## 2019-11-05 DIAGNOSIS — R41841 Cognitive communication deficit: Secondary | ICD-10-CM | POA: Diagnosis not present

## 2019-11-06 DIAGNOSIS — M6281 Muscle weakness (generalized): Secondary | ICD-10-CM | POA: Diagnosis not present

## 2019-11-06 DIAGNOSIS — R41841 Cognitive communication deficit: Secondary | ICD-10-CM | POA: Diagnosis not present

## 2019-11-06 DIAGNOSIS — R488 Other symbolic dysfunctions: Secondary | ICD-10-CM | POA: Diagnosis not present

## 2019-11-06 DIAGNOSIS — R2681 Unsteadiness on feet: Secondary | ICD-10-CM | POA: Diagnosis not present

## 2019-11-09 DIAGNOSIS — R41841 Cognitive communication deficit: Secondary | ICD-10-CM | POA: Diagnosis not present

## 2019-11-09 DIAGNOSIS — M6281 Muscle weakness (generalized): Secondary | ICD-10-CM | POA: Diagnosis not present

## 2019-11-09 DIAGNOSIS — R488 Other symbolic dysfunctions: Secondary | ICD-10-CM | POA: Diagnosis not present

## 2019-11-09 DIAGNOSIS — R2681 Unsteadiness on feet: Secondary | ICD-10-CM | POA: Diagnosis not present

## 2019-11-10 DIAGNOSIS — M6281 Muscle weakness (generalized): Secondary | ICD-10-CM | POA: Diagnosis not present

## 2019-11-10 DIAGNOSIS — R2681 Unsteadiness on feet: Secondary | ICD-10-CM | POA: Diagnosis not present

## 2019-11-10 DIAGNOSIS — R488 Other symbolic dysfunctions: Secondary | ICD-10-CM | POA: Diagnosis not present

## 2019-11-10 DIAGNOSIS — R41841 Cognitive communication deficit: Secondary | ICD-10-CM | POA: Diagnosis not present

## 2019-11-11 DIAGNOSIS — M6281 Muscle weakness (generalized): Secondary | ICD-10-CM | POA: Diagnosis not present

## 2019-11-11 DIAGNOSIS — R2681 Unsteadiness on feet: Secondary | ICD-10-CM | POA: Diagnosis not present

## 2019-11-11 DIAGNOSIS — R41841 Cognitive communication deficit: Secondary | ICD-10-CM | POA: Diagnosis not present

## 2019-11-11 DIAGNOSIS — R488 Other symbolic dysfunctions: Secondary | ICD-10-CM | POA: Diagnosis not present

## 2019-11-12 DIAGNOSIS — R2681 Unsteadiness on feet: Secondary | ICD-10-CM | POA: Diagnosis not present

## 2019-11-12 DIAGNOSIS — M6281 Muscle weakness (generalized): Secondary | ICD-10-CM | POA: Diagnosis not present

## 2019-11-12 DIAGNOSIS — R488 Other symbolic dysfunctions: Secondary | ICD-10-CM | POA: Diagnosis not present

## 2019-11-12 DIAGNOSIS — R41841 Cognitive communication deficit: Secondary | ICD-10-CM | POA: Diagnosis not present

## 2019-11-13 DIAGNOSIS — R41841 Cognitive communication deficit: Secondary | ICD-10-CM | POA: Diagnosis not present

## 2019-11-13 DIAGNOSIS — M6281 Muscle weakness (generalized): Secondary | ICD-10-CM | POA: Diagnosis not present

## 2019-11-13 DIAGNOSIS — R488 Other symbolic dysfunctions: Secondary | ICD-10-CM | POA: Diagnosis not present

## 2019-11-13 DIAGNOSIS — R2681 Unsteadiness on feet: Secondary | ICD-10-CM | POA: Diagnosis not present

## 2019-11-16 DIAGNOSIS — M6281 Muscle weakness (generalized): Secondary | ICD-10-CM | POA: Diagnosis not present

## 2019-11-16 DIAGNOSIS — R488 Other symbolic dysfunctions: Secondary | ICD-10-CM | POA: Diagnosis not present

## 2019-11-16 DIAGNOSIS — R41841 Cognitive communication deficit: Secondary | ICD-10-CM | POA: Diagnosis not present

## 2019-11-16 DIAGNOSIS — R2681 Unsteadiness on feet: Secondary | ICD-10-CM | POA: Diagnosis not present

## 2019-11-17 DIAGNOSIS — R41841 Cognitive communication deficit: Secondary | ICD-10-CM | POA: Diagnosis not present

## 2019-11-17 DIAGNOSIS — M6281 Muscle weakness (generalized): Secondary | ICD-10-CM | POA: Diagnosis not present

## 2019-11-17 DIAGNOSIS — R488 Other symbolic dysfunctions: Secondary | ICD-10-CM | POA: Diagnosis not present

## 2019-11-17 DIAGNOSIS — R2681 Unsteadiness on feet: Secondary | ICD-10-CM | POA: Diagnosis not present

## 2019-11-18 DIAGNOSIS — M6281 Muscle weakness (generalized): Secondary | ICD-10-CM | POA: Diagnosis not present

## 2019-11-18 DIAGNOSIS — R488 Other symbolic dysfunctions: Secondary | ICD-10-CM | POA: Diagnosis not present

## 2019-12-02 DIAGNOSIS — R600 Localized edema: Secondary | ICD-10-CM | POA: Diagnosis not present

## 2019-12-15 DIAGNOSIS — R112 Nausea with vomiting, unspecified: Secondary | ICD-10-CM | POA: Diagnosis not present

## 2019-12-21 DIAGNOSIS — M79674 Pain in right toe(s): Secondary | ICD-10-CM | POA: Diagnosis not present

## 2019-12-21 DIAGNOSIS — L603 Nail dystrophy: Secondary | ICD-10-CM | POA: Diagnosis not present

## 2019-12-21 DIAGNOSIS — B351 Tinea unguium: Secondary | ICD-10-CM | POA: Diagnosis not present

## 2019-12-22 DIAGNOSIS — Z23 Encounter for immunization: Secondary | ICD-10-CM | POA: Diagnosis not present

## 2019-12-22 DIAGNOSIS — E782 Mixed hyperlipidemia: Secondary | ICD-10-CM | POA: Diagnosis not present

## 2019-12-22 DIAGNOSIS — K219 Gastro-esophageal reflux disease without esophagitis: Secondary | ICD-10-CM | POA: Diagnosis not present

## 2019-12-22 DIAGNOSIS — I10 Essential (primary) hypertension: Secondary | ICD-10-CM | POA: Diagnosis not present

## 2019-12-29 DIAGNOSIS — B351 Tinea unguium: Secondary | ICD-10-CM | POA: Diagnosis not present

## 2020-01-07 DIAGNOSIS — H401132 Primary open-angle glaucoma, bilateral, moderate stage: Secondary | ICD-10-CM | POA: Diagnosis not present

## 2020-01-07 DIAGNOSIS — H26493 Other secondary cataract, bilateral: Secondary | ICD-10-CM | POA: Diagnosis not present

## 2020-01-12 DIAGNOSIS — B351 Tinea unguium: Secondary | ICD-10-CM | POA: Diagnosis not present

## 2020-02-11 DIAGNOSIS — R600 Localized edema: Secondary | ICD-10-CM | POA: Diagnosis not present

## 2021-05-31 IMAGING — CT CT HEAD W/O CM
2 series · 15 of 40 positions shown, 18 images · non-contrast
Comparison: None.

CLINICAL DATA: Dizziness leading to fall. Hematoma to posterior.
Posterior neck pain.

EXAM:
CT HEAD WITHOUT CONTRAST
CT CERVICAL SPINE WITHOUT CONTRAST
TECHNIQUE: Multidetector CT imaging of the head and cervical spine was
performed following the standard protocol without intravenous
contrast. Multiplanar CT image reconstructions of the cervical spine
were also generated.

[Series 3: head wo · axial · 0.43mm/px · z∈[-134,-9]mm · 12 of 30 slices shown, 15 images]
[im 3/30  brain]
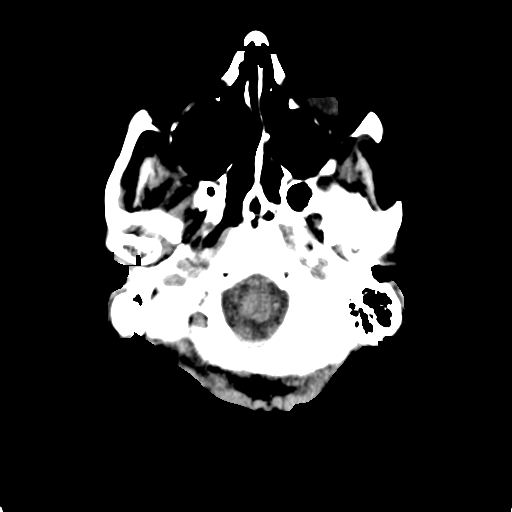
[im 3/30  bone]
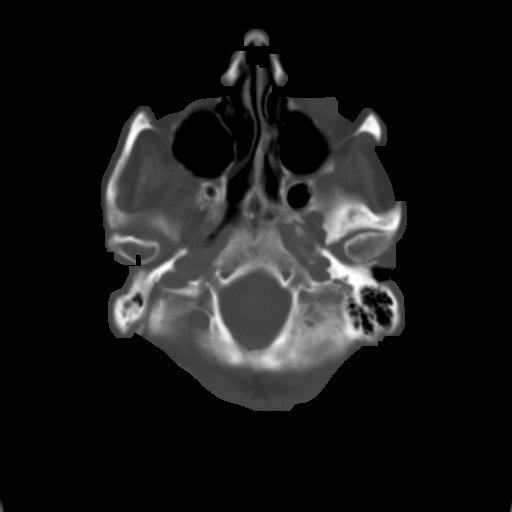
[im 5/30  brain]
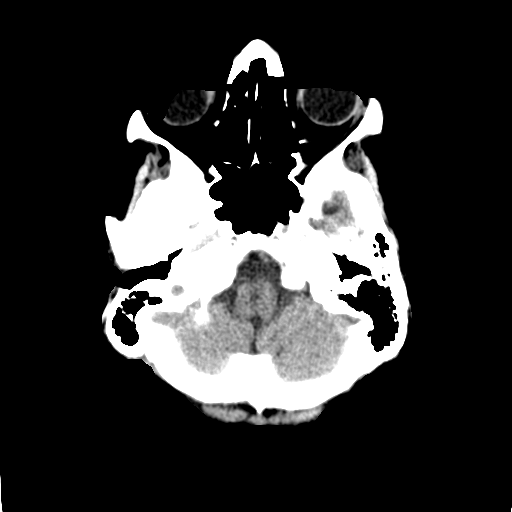
[im 7/30  brain]
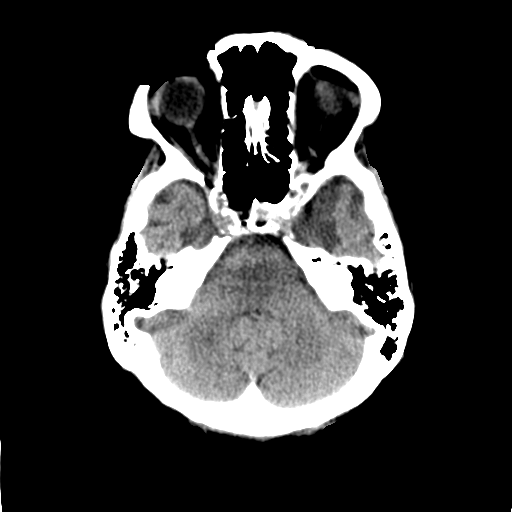
[im 10/30  brain]
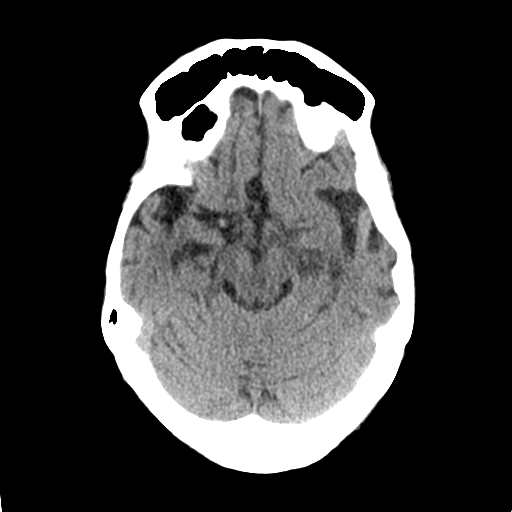
[im 12/30  brain]
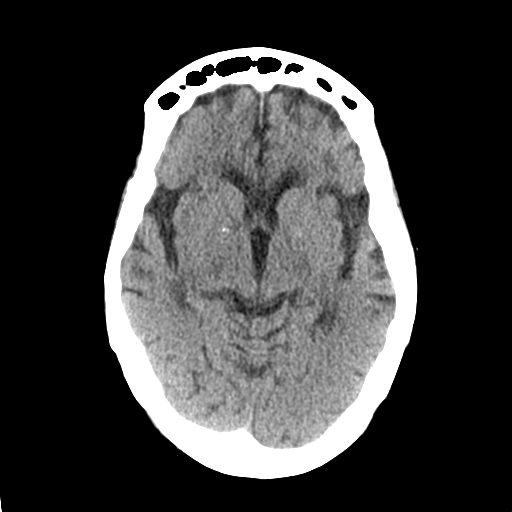
[im 12/30  bone]
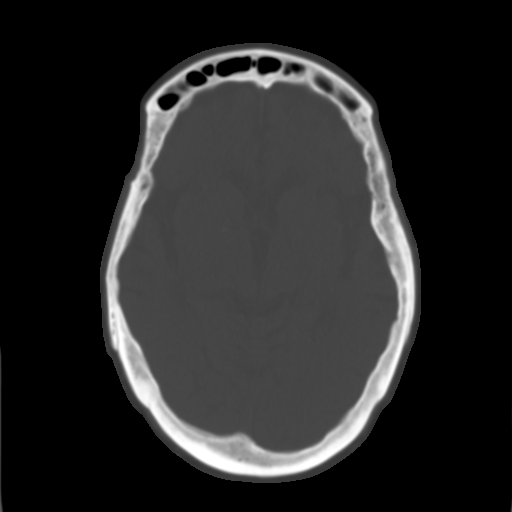
[im 14/30  brain]
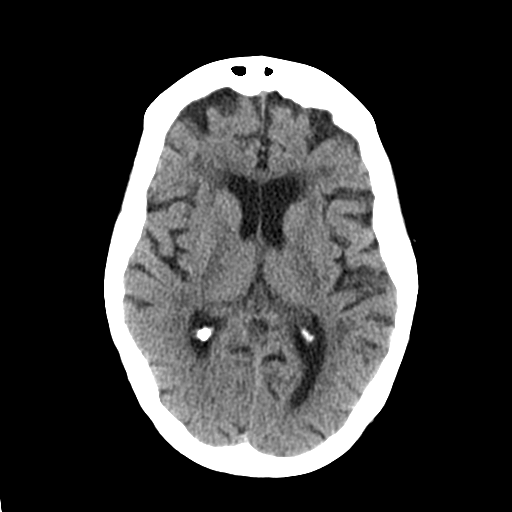
[im 17/30  brain]
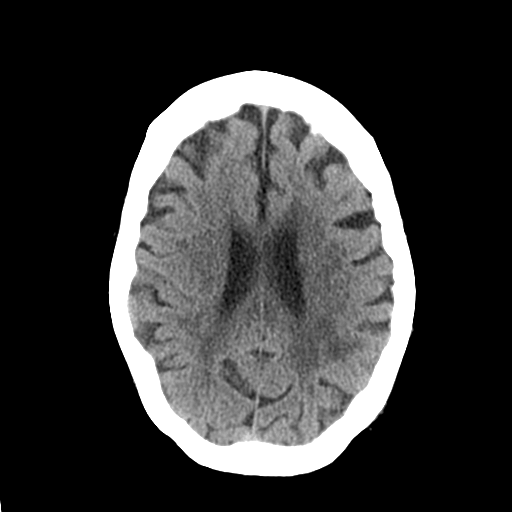
[im 19/30  brain]
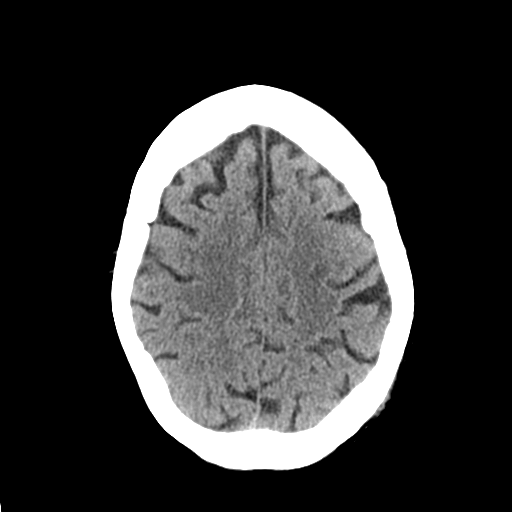
[im 21/30  brain]
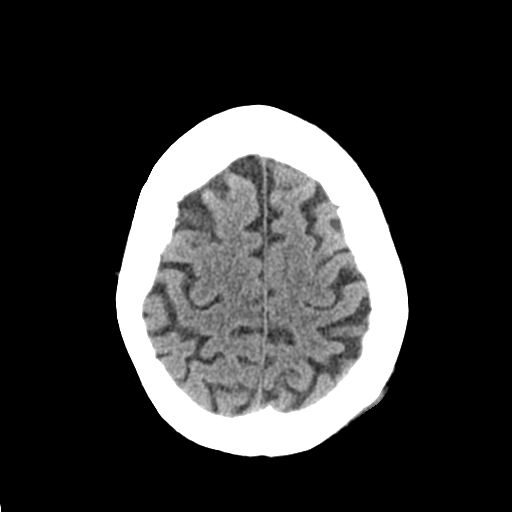
[im 21/30  bone]
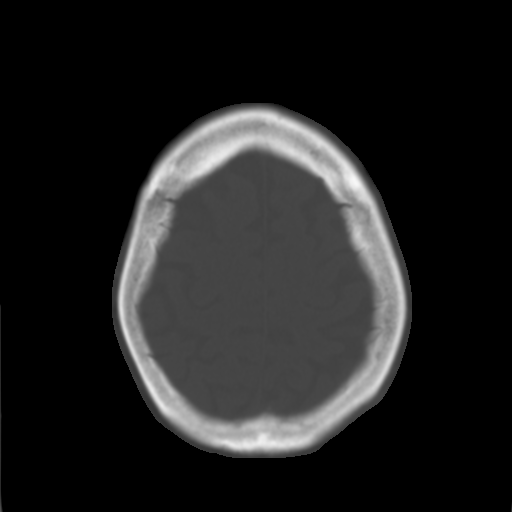
[im 24/30  brain]
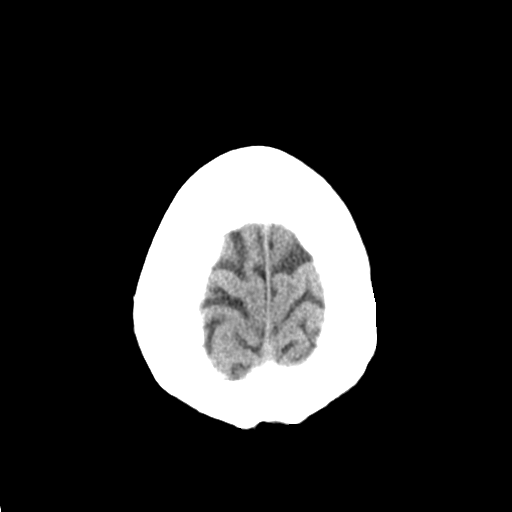
[im 26/30  brain]
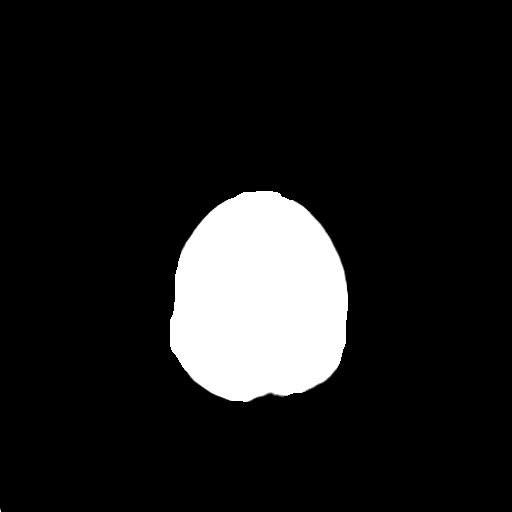
[im 28/30  brain]
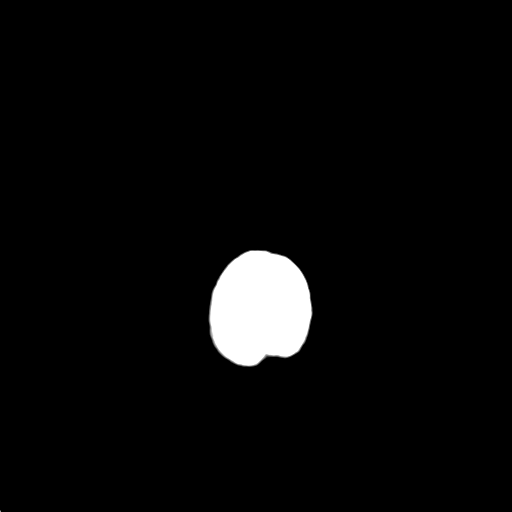

[Series 5: coronal soft tissue · coronal · 0.27mm/px · 3 of 67 slices shown]
[im 23/67  brain]
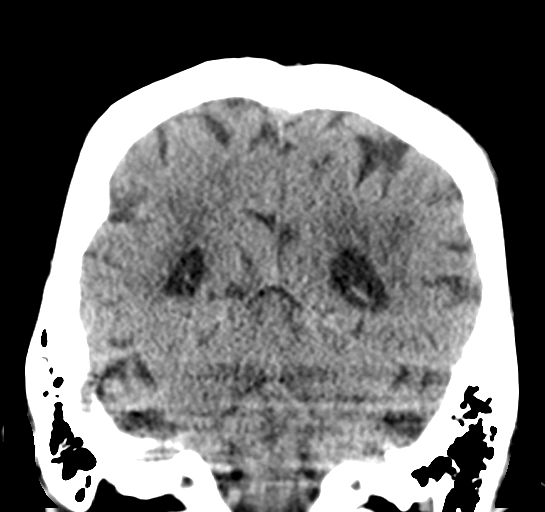
[im 30/67  brain]
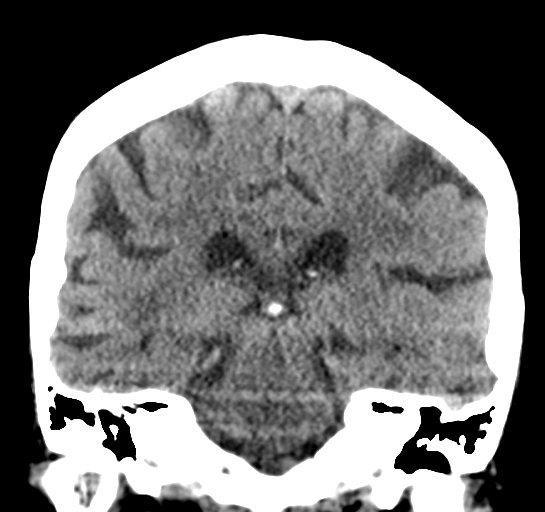
[im 37/67  brain]
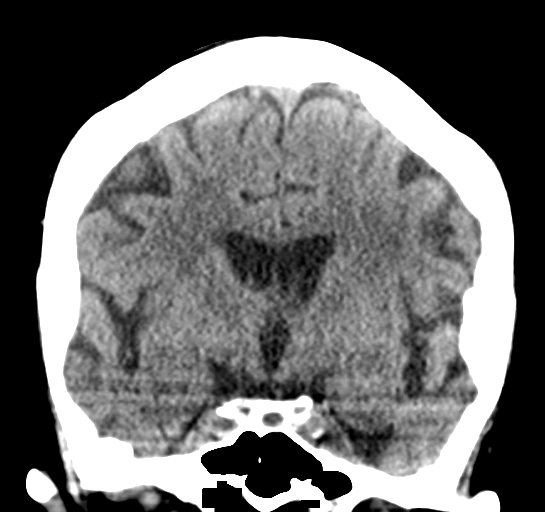

[15 of 40 positions shown; findings below may reference images not displayed]

FINDINGS: CT HEAD FINDINGS

Brain: Brain volume is normal for age. No intracranial hemorrhage,
mass effect, or midline shift. No hydrocephalus. The basilar
cisterns are patent. Mild periventricular chronic small vessel
ischemia. No evidence of territorial infarct or acute ischemia. No
extra-axial or intracranial fluid collection.

Vascular: No hyperdense vessel or unexpected calcification.

Skull: No fracture or focal lesion.

Sinuses/Orbits: Paranasal sinuses and mastoid air cells are clear.
The visualized orbits are unremarkable.

Other: Left parietal scalp hematoma.

CT CERVICAL SPINE FINDINGS

Alignment: Grade 1 retrolisthesis of C3 on C4 and anterolisthesis of
C7 on T1, likely degenerative. No traumatic subluxation. No jumped
or perched facets.

Skull base and vertebrae: Bony fusion of C5-C6 vertebral bodies may
be remote postsurgical or congenital. No acute fracture. Vertebral
body heights are maintained. The dens and skull base are intact.

Soft tissues and spinal canal: No prevertebral fluid or swelling. No
visible canal hematoma.

Disc levels: Small disc space at C5-C6 which may be congenital or
postsurgical. Disc space narrowing and endplate spurring at all
additional levels, multilevel facet hypertrophy.

Upper chest: Emphysema. No acute findings.

Other: Carotid calcifications.
IMPRESSION: 1. Left parietal scalp hematoma. No acute intracranial abnormality.
No skull fracture.
2. Multilevel degenerative change throughout the cervical spine
without acute fracture or subluxation.

## 2021-12-27 DEATH — deceased
